# Patient Record
Sex: Male | Born: 1945 | Race: White | Hispanic: No | Marital: Married | Smoking: Never smoker
Health system: Southern US, Community
[De-identification: ages and names within clinical notes are randomized; demographics above are authoritative.]

## PROBLEM LIST (undated history)

## (undated) DIAGNOSIS — E079 Disorder of thyroid, unspecified: Secondary | ICD-10-CM

## (undated) DIAGNOSIS — F32A Depression, unspecified: Secondary | ICD-10-CM

## (undated) DIAGNOSIS — K30 Functional dyspepsia: Secondary | ICD-10-CM

## (undated) DIAGNOSIS — F329 Major depressive disorder, single episode, unspecified: Secondary | ICD-10-CM

## (undated) DIAGNOSIS — N183 Chronic kidney disease, stage 3 unspecified: Secondary | ICD-10-CM

## (undated) DIAGNOSIS — I10 Essential (primary) hypertension: Secondary | ICD-10-CM

## (undated) DIAGNOSIS — I255 Ischemic cardiomyopathy: Secondary | ICD-10-CM

## (undated) DIAGNOSIS — N400 Enlarged prostate without lower urinary tract symptoms: Secondary | ICD-10-CM

## (undated) DIAGNOSIS — I2510 Atherosclerotic heart disease of native coronary artery without angina pectoris: Secondary | ICD-10-CM

## (undated) DIAGNOSIS — I5042 Chronic combined systolic (congestive) and diastolic (congestive) heart failure: Secondary | ICD-10-CM

## (undated) DIAGNOSIS — F0390 Unspecified dementia without behavioral disturbance: Secondary | ICD-10-CM

## (undated) DIAGNOSIS — E785 Hyperlipidemia, unspecified: Secondary | ICD-10-CM

## (undated) DIAGNOSIS — R972 Elevated prostate specific antigen [PSA]: Secondary | ICD-10-CM

## (undated) HISTORY — PX: CARDIAC CATHETERIZATION: SHX172

## (undated) HISTORY — DX: Chronic kidney disease, stage 3 (moderate): N18.3

## (undated) HISTORY — DX: Depression, unspecified: F32.A

## (undated) HISTORY — DX: Essential (primary) hypertension: I10

## (undated) HISTORY — DX: Chronic combined systolic (congestive) and diastolic (congestive) heart failure: I50.42

## (undated) HISTORY — DX: Chronic kidney disease, stage 3 unspecified: N18.30

## (undated) HISTORY — DX: Ischemic cardiomyopathy: I25.5

## (undated) HISTORY — DX: Hyperlipidemia, unspecified: E78.5

## (undated) HISTORY — DX: Atherosclerotic heart disease of native coronary artery without angina pectoris: I25.10

---
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
## 2012-08-26 DIAGNOSIS — R972 Elevated prostate specific antigen [PSA]: Secondary | ICD-10-CM | POA: Insufficient documentation

## 2012-08-26 DIAGNOSIS — L219 Seborrheic dermatitis, unspecified: Secondary | ICD-10-CM | POA: Insufficient documentation

## 2014-12-21 ENCOUNTER — Encounter
Admission: RE | Admit: 2014-12-21 | Discharge: 2014-12-21 | Disposition: A | Discharge status: Home / Self Care | Payer: Medicare Other | Source: Ambulatory Visit | Attending: Urology | Admitting: Urology

## 2014-12-21 DIAGNOSIS — Z01810 Encounter for preprocedural cardiovascular examination: Secondary | ICD-10-CM | POA: Diagnosis not present

## 2014-12-21 DIAGNOSIS — I10 Essential (primary) hypertension: Secondary | ICD-10-CM | POA: Diagnosis not present

## 2014-12-21 HISTORY — DX: Functional dyspepsia: K30

## 2014-12-21 HISTORY — DX: Elevated prostate specific antigen (PSA): R97.20

## 2014-12-21 NOTE — Patient Instructions (Signed)
  Your procedure is scheduled on: 01/02/15 Tues  Report to Day Surgery. To find out your arrival time please call [phone] between 1PM - 3PM on 01/01/15 Mon.  Remember: Instructions that are not followed completely may result in serious medical risk, up to and including death, or upon the discretion of your surgeon and anesthesiologist your surgery may need to be rescheduled.    _x___ 1. Do not eat food or drink liquids after midnight. No gum chewing or hard candies.     ____ 2. No Alcohol for 24 hours before or after surgery.   ____ 3. Bring all medications with you on the day of surgery if instructed.    __x__ 4. Notify your doctor if there is any change in your medical condition     (cold, fever, infections).     Do not wear jewelry, make-up, hairpins, clips or nail polish.  Do not wear lotions, powders, or perfumes. You may wear deodorant.  Do not shave 48 hours prior to surgery. Men may shave face and neck.  Do not bring valuables to the hospital.    [facility] is not responsible for any belongings or valuables.               Contacts, dentures or bridgework may not be worn into surgery.  Leave your suitcase in the car. After surgery it may be brought to your room.  For patients admitted to the hospital, discharge time is determined by your                treatment team.   Patients discharged the day of surgery will not be allowed to drive home.   Please read over the following fact sheets that you were given:      __x__ Take these medicines the morning of surgery with A SIP OF WATER:    1. amLODipine (NORVASC) 5 MG tablet  2. metoprolol succinate (TOPROL-XL) 100 MG 24 hr tablet  3.   4.  5.  6.  ____ Fleet Enema (as directed)   _x___ Use CHG Soap as directed  ____ Use inhalers on the day of surgery  ____ Stop metformin 2 days prior to surgery    ____ Take 1/2 of usual insulin dose the night before surgery and none on the morning of surgery.   ____ Stop  Coumadin/Plavix/aspirin on   ____ Stop Anti-inflammatories on    ____ Stop supplements until after surgery.    ____ Bring C-Pap to the hospital.

## 2014-12-22 NOTE — H&P (Signed)
NAMESTEFFON, TAYMAN NO.:  [id number]  MEDICAL RECORD NO.:  [id number]  LOCATION:  PAT                          FACILITY:  [facility]  PHYSICIAN:  Anola Gurney          DATE OF BIRTH:  09-07-1945  DATE OF ADMISSION:  12/21/2014 DATE OF DISCHARGE:  12/21/2014                            HISTORY AND PHYSICAL   Same Day Surgery, January 02, 2015.  CHIEF COMPLAINT:  Difficulty voiding.  HISTORY OF PRESENT ILLNESS:  [name] is a 69 year old Caucasian male with a long history of difficulty voiding.  He complains of incomplete emptying, intermittent weak stream and nocturia.  He also was found to have an elevated PSA at 4.09 ng/mL on Oct 25, 2014 and 4.20 ng/mL on September 21, 2014.  He underwent ultrasound-guided needle biopsy on November 23, 2014.  Ultrasound revealed a 55.1 g prostate.  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  The patient comes in now for photovaporization of the prostate with a GreenLight laser.  PAST MEDICAL HISTORY:  The patient was allergic to sulfa drugs.  CURRENT MEDICATIONS:  Included amlodipine, clotrimazole, doxycycline, metoprolol, tamsulosin and finasteride.  PREVIOUS SURGICAL PROCEDURE:  No previous surgical procedures.  SOCIAL HISTORY:  The patient denied tobacco or alcohol use.  FAMILY HISTORY:  Mother died at age 62 of lung cancer.  Father died at age 23 of pulmonary embolus secondary to hip fracture.  PAST AND CURRENT MEDICAL CONDITIONS: 1. Hypertension since 2004. 2. Arthritis.  REVIEW OF SYSTEMS:  The patient denies chest pain, shortness of breath, diabetes, stroke or heart disease.  PHYSICAL EXAMINATION:  GENERAL:  Generally well-nourished white male in no acute distress. HEENT:  Sclerae were clear.  Pupils were equally round and reactive to light and accommodation.  Extraocular movements were intact. NECK:  Supple.  No palpable masses or tenderness.  Thyroid gland was smooth without palpable  nodules.  No audible carotid bruits. LUNGS:  Clear to auscultation. CARDIOVASCULAR:  Regular rhythm and rate without audible murmurs. ABDOMEN:  Soft abdomen.  No CVA tenderness.  No palpable abdominal masses. GU:  The patient was uncircumcised with phimosis.  Testes were smooth and nontender, 18 cm size, each. RECTAL:  Internal and external hemorrhoids.  Prostate gland greater than 50 g, smooth and nontender. NEUROMUSCULAR:  Alert and oriented x3.  IMPRESSION: 1. Significant benign prostatic hypertrophy with bladder outlet     obstruction. 2. Elevated PSA. 3. High-grade prostatic intraepithelial neoplasia.  PLAN:  Photovaporization of prostate with a GreenLight laser.          ______________________________ Anola Gurney     MW/MEDQ  D:  12/21/2014  T:  12/22/2014  Job:  [id number]

## 2015-01-02 ENCOUNTER — Ambulatory Visit
Admission: RE | Admit: 2015-01-02 | Discharge: 2015-01-02 | Disposition: A | Discharge status: Home / Self Care | Payer: Medicare Other | Source: Ambulatory Visit | Attending: Urology | Admitting: Urology

## 2015-01-02 ENCOUNTER — Ambulatory Visit: Payer: Medicare Other | Admitting: Anesthesiology

## 2015-01-02 ENCOUNTER — Encounter
Admission: RE | Disposition: A | Discharge status: Home / Self Care | Payer: Self-pay | Source: Ambulatory Visit | Attending: Urology

## 2015-01-02 ENCOUNTER — Encounter: Payer: Self-pay | Admitting: *Deleted

## 2015-01-02 DIAGNOSIS — M1990 Unspecified osteoarthritis, unspecified site: Secondary | ICD-10-CM | POA: Diagnosis not present

## 2015-01-02 DIAGNOSIS — K30 Functional dyspepsia: Secondary | ICD-10-CM | POA: Insufficient documentation

## 2015-01-02 DIAGNOSIS — R300 Dysuria: Secondary | ICD-10-CM | POA: Insufficient documentation

## 2015-01-02 DIAGNOSIS — K219 Gastro-esophageal reflux disease without esophagitis: Secondary | ICD-10-CM | POA: Diagnosis not present

## 2015-01-02 DIAGNOSIS — K648 Other hemorrhoids: Secondary | ICD-10-CM | POA: Diagnosis not present

## 2015-01-02 DIAGNOSIS — Z882 Allergy status to sulfonamides status: Secondary | ICD-10-CM | POA: Insufficient documentation

## 2015-01-02 DIAGNOSIS — K644 Residual hemorrhoidal skin tags: Secondary | ICD-10-CM | POA: Diagnosis not present

## 2015-01-02 DIAGNOSIS — R3912 Poor urinary stream: Secondary | ICD-10-CM | POA: Insufficient documentation

## 2015-01-02 DIAGNOSIS — R351 Nocturia: Secondary | ICD-10-CM | POA: Diagnosis not present

## 2015-01-02 DIAGNOSIS — N138 Other obstructive and reflux uropathy: Secondary | ICD-10-CM | POA: Diagnosis not present

## 2015-01-02 DIAGNOSIS — Z79899 Other long term (current) drug therapy: Secondary | ICD-10-CM | POA: Diagnosis not present

## 2015-01-02 DIAGNOSIS — I10 Essential (primary) hypertension: Secondary | ICD-10-CM | POA: Insufficient documentation

## 2015-01-02 DIAGNOSIS — N401 Enlarged prostate with lower urinary tract symptoms: Secondary | ICD-10-CM | POA: Diagnosis not present

## 2015-01-02 HISTORY — PX: GREEN LIGHT LASER TURP (TRANSURETHRAL RESECTION OF PROSTATE: SHX6260

## 2015-01-02 SURGERY — GREEN LIGHT LASER TURP (TRANSURETHRAL RESECTION OF PROSTATE
Anesthesia: General | Site: Prostate | Wound class: Clean Contaminated

## 2015-01-02 MED ORDER — FENTANYL CITRATE (PF) 100 MCG/2ML IJ SOLN
INTRAMUSCULAR | Status: DC | PRN
  Administered 2015-01-02 (×3): 50 ug via INTRAVENOUS
  Administered 2015-01-02: 100 ug via INTRAVENOUS

## 2015-01-02 MED ORDER — PROPOFOL 10 MG/ML IV BOLUS
INTRAVENOUS | Status: DC | PRN
  Administered 2015-01-02: 140 mg via INTRAVENOUS

## 2015-01-02 MED ORDER — LIDOCAINE HCL 2 % EX GEL
CUTANEOUS | Status: DC | PRN
  Administered 2015-01-02: 1 via URETHRAL

## 2015-01-02 MED ORDER — FAMOTIDINE 20 MG PO TABS
20.0000 mg | ORAL_TABLET | Freq: Once | ORAL | Status: AC
  Administered 2015-01-02: 20 mg via ORAL

## 2015-01-02 MED ORDER — ACETAMINOPHEN 10 MG/ML IV SOLN
INTRAVENOUS | Status: DC | PRN
  Administered 2015-01-02: 1000 mg via INTRAVENOUS

## 2015-01-02 MED ORDER — ONDANSETRON HCL 4 MG/2ML IJ SOLN
INTRAMUSCULAR | Status: DC | PRN
  Administered 2015-01-02 (×2): 4 mg via INTRAVENOUS

## 2015-01-02 MED ORDER — LEVOFLOXACIN IN D5W 500 MG/100ML IV SOLN
500.0000 mg | INTRAVENOUS | Status: DC
  Administered 2015-01-02: 500 mg via INTRAVENOUS

## 2015-01-02 MED ORDER — LEVOFLOXACIN 500 MG PO TABS: 500.0000 mg | ORAL_TABLET | Freq: Every day | ORAL | Status: DC

## 2015-01-02 MED ORDER — OXYCODONE HCL 5 MG PO TABS: 5.0000 mg | ORAL_TABLET | Freq: Once | ORAL | Status: DC | PRN

## 2015-01-02 MED ORDER — MIDAZOLAM HCL 2 MG/2ML IJ SOLN
INTRAMUSCULAR | Status: DC | PRN
  Administered 2015-01-02: 1 mg via INTRAVENOUS

## 2015-01-02 MED ORDER — LEVOFLOXACIN IN D5W 500 MG/100ML IV SOLN
INTRAVENOUS | Status: AC
  Administered 2015-01-02: 500 mg via INTRAVENOUS
  Filled 2015-01-02: qty 100

## 2015-01-02 MED ORDER — FENTANYL CITRATE (PF) 100 MCG/2ML IJ SOLN: 25.0000 ug | INTRAMUSCULAR | Status: DC | PRN

## 2015-01-02 MED ORDER — ACETAMINOPHEN 10 MG/ML IV SOLN
INTRAVENOUS | Status: AC
  Filled 2015-01-02: qty 100

## 2015-01-02 MED ORDER — HYDROMORPHONE HCL 1 MG/ML IJ SOLN
INTRAMUSCULAR | Status: DC | PRN
  Administered 2015-01-02: 1 mg via INTRAVENOUS

## 2015-01-02 MED ORDER — URIBEL 118 MG PO CAPS: 1.0000 | ORAL_CAPSULE | Freq: Four times a day (QID) | ORAL | Status: DC | PRN

## 2015-01-02 MED ORDER — DOCUSATE SODIUM 100 MG PO CAPS: 200.0000 mg | ORAL_CAPSULE | Freq: Two times a day (BID) | ORAL | Status: DC

## 2015-01-02 MED ORDER — OXYCODONE HCL 5 MG/5ML PO SOLN: 5.0000 mg | Freq: Once | ORAL | Status: DC | PRN

## 2015-01-02 MED ORDER — LACTATED RINGERS IV SOLN
INTRAVENOUS | Status: DC
  Administered 2015-01-02 (×2): via INTRAVENOUS

## 2015-01-02 MED ORDER — NUCYNTA 50 MG PO TABS: 50.0000 mg | ORAL_TABLET | Freq: Four times a day (QID) | ORAL | Status: DC | PRN

## 2015-01-02 MED ORDER — LIDOCAINE HCL 2 % EX GEL
CUTANEOUS | Status: AC
  Filled 2015-01-02: qty 10

## 2015-01-02 MED ORDER — LIDOCAINE HCL (CARDIAC) 20 MG/ML IV SOLN
INTRAVENOUS | Status: DC | PRN
  Administered 2015-01-02: 100 mg via INTRAVENOUS

## 2015-01-02 MED ORDER — KETAMINE HCL 50 MG/ML IJ SOLN
INTRAMUSCULAR | Status: DC | PRN
  Administered 2015-01-02: 50 mg via INTRAMUSCULAR

## 2015-01-02 MED ORDER — ONDANSETRON 8 MG PO TBDP: 8.0000 mg | ORAL_TABLET | Freq: Four times a day (QID) | ORAL | Status: DC | PRN

## 2015-01-02 MED ORDER — BELLADONNA ALKALOIDS-OPIUM 16.2-60 MG RE SUPP
RECTAL | Status: DC | PRN
  Administered 2015-01-02: 1 via RECTAL

## 2015-01-02 MED ORDER — FAMOTIDINE 20 MG PO TABS
ORAL_TABLET | ORAL | Status: AC
  Administered 2015-01-02: 20 mg via ORAL
  Filled 2015-01-02: qty 1

## 2015-01-02 MED ORDER — GLYCOPYRROLATE 0.2 MG/ML IJ SOLN
INTRAMUSCULAR | Status: DC | PRN
  Administered 2015-01-02: 0.2 mg via INTRAVENOUS

## 2015-01-02 MED ORDER — BELLADONNA ALKALOIDS-OPIUM 16.2-60 MG RE SUPP
RECTAL | Status: AC
  Filled 2015-01-02: qty 1

## 2015-01-02 MED ORDER — ONDANSETRON HCL 4 MG/2ML IJ SOLN
INTRAMUSCULAR | Status: AC
  Filled 2015-01-02: qty 2

## 2015-01-02 SURGICAL SUPPLY — 27 items
ADAPTER IRRIG TUBE 2 SPIKE SOL (ADAPTER) ×6 IMPLANT
BAG URO DRAIN 2000ML W/SPOUT (MISCELLANEOUS) ×3 IMPLANT
CATH FOLEY 2WAY  5CC 20FR SIL (CATHETERS) ×2
CATH FOLEY 2WAY 5CC 20FR SIL (CATHETERS) ×1 IMPLANT
GLOVE BIO SURGEON STRL SZ7 (GLOVE) ×6 IMPLANT
GLOVE BIO SURGEON STRL SZ7.5 (GLOVE) ×9 IMPLANT
GOWN STRL REUS W/ TWL LRG LVL3 (GOWN DISPOSABLE) ×1 IMPLANT
GOWN STRL REUS W/ TWL XL LVL3 (GOWN DISPOSABLE) ×1 IMPLANT
GOWN STRL REUS W/TWL LRG LVL3 (GOWN DISPOSABLE) ×2
GOWN STRL REUS W/TWL XL LVL3 (GOWN DISPOSABLE) ×2
IV NS 1000ML (IV SOLUTION) ×2
IV NS 1000ML BAXH (IV SOLUTION) ×1 IMPLANT
IV SET PRIMARY 15D 139IN B9900 (IV SETS) ×3 IMPLANT
JELLY LUB 2OZ STRL (MISCELLANEOUS) ×2
JELLY LUBE 2OZ STRL (MISCELLANEOUS) ×1 IMPLANT
KIT RM TURNOVER CYSTO AR (KITS) ×3 IMPLANT
LASER GRNLGT 950 (MISCELLANEOUS) ×3 IMPLANT
LASER GRNLGT MOXY FIBER 750UM (MISCELLANEOUS) ×3 IMPLANT
PACK CYSTO AR (MISCELLANEOUS) ×3 IMPLANT
PREP PVP WINGED SPONGE (MISCELLANEOUS) ×3 IMPLANT
SET IRRIG Y TYPE TUR BLADDER L (SET/KITS/TRAYS/PACK) ×3 IMPLANT
SOL PREP PVP 2OZ (MISCELLANEOUS) ×3
SOLUTION PREP PVP 2OZ (MISCELLANEOUS) ×1 IMPLANT
SYRINGE IRR TOOMEY STRL 70CC (SYRINGE) ×3 IMPLANT
TUBING CONNECTING 10 (TUBING) ×2 IMPLANT
TUBING CONNECTING 10' (TUBING) ×1
WATER STERILE IRR 1000ML POUR (IV SOLUTION) ×3 IMPLANT

## 2015-01-02 NOTE — Anesthesia Procedure Notes (Signed)
Procedure Name: LMA Insertion Date/Time: 01/02/2015 1:56 PM Performed by: Shirlee Limerick, Sky Primo Pre-anesthesia Checklist: Patient identified, Emergency Drugs available, Suction available and Patient being monitored Patient Re-evaluated:Patient Re-evaluated prior to inductionOxygen Delivery Method: Circle system utilized Preoxygenation: Pre-oxygenation with 100% oxygen Intubation Type: IV induction LMA: LMA inserted LMA Size: 5.0 Number of attempts: 1

## 2015-01-02 NOTE — Anesthesia Postprocedure Evaluation (Signed)
  Anesthesia Post-op Note  Patient: Jesus Bolton  Procedure(s) Performed: Procedure(s): GREEN LIGHT LASER TURP (TRANSURETHRAL RESECTION OF PROSTATE (N/A)  Anesthesia type:General  Patient location: PACU  Post pain: Pain level controlled  Post assessment: Post-op Vital signs reviewed, Patient's Cardiovascular Status Stable, Respiratory Function Stable, Patent Airway and No signs of Nausea or vomiting  Post vital signs: Reviewed and stable  Last Vitals:  Filed Vitals:   01/02/15 1642  BP: 135/62  Pulse:   Temp: 36.1 C  Resp: 18    Level of consciousness: awake, alert  and patient cooperative  Complications: No apparent anesthesia complications

## 2015-01-02 NOTE — Anesthesia Preprocedure Evaluation (Addendum)
Anesthesia Evaluation  Patient identified by MRN, date of birth, ID band Patient awake    Reviewed: Allergy & Precautions, H&P , NPO status , Patient's Chart, lab work & pertinent test results, reviewed documented beta blocker date and time   Airway Mallampati: III  TM Distance: >3 FB Neck ROM: limited    Dental  (+) Chipped   Pulmonary neg pulmonary ROS,  breath sounds clear to auscultation  Pulmonary exam normal       Cardiovascular Exercise Tolerance: Good hypertension, Normal cardiovascular examRhythm:regular Rate:Normal     Neuro/Psych negative neurological ROS  negative psych ROS   GI/Hepatic Neg liver ROS, GERD-  Controlled,  Endo/Other  negative endocrine ROS  Renal/GU negative Renal ROS  negative genitourinary   Musculoskeletal   Abdominal   Peds  Hematology negative hematology ROS (+)   Anesthesia Other Findings Past Medical History:   Hypertension                                                 Elevated PSA                                                 Indigestion                                                  Reproductive/Obstetrics negative OB ROS                            Anesthesia Physical Anesthesia Plan  ASA: III  Anesthesia Plan: General   Post-op Pain Management:    Induction:   Airway Management Planned:   Additional Equipment:   Intra-op Plan:   Post-operative Plan:   Informed Consent: I have reviewed the patients History and Physical, chart, labs and discussed the procedure including the risks, benefits and alternatives for the proposed anesthesia with the patient or authorized representative who has indicated his/her understanding and acceptance.   Dental Advisory Given  Plan Discussed with: Anesthesiologist, CRNA and Surgeon  Anesthesia Plan Comments:         Anesthesia Quick Evaluation

## 2015-01-02 NOTE — Transfer of Care (Signed)
Immediate Anesthesia Transfer of Care Note  Patient: Jesus Bolton  Procedure(s) Performed: Procedure(s): GREEN LIGHT LASER TURP (TRANSURETHRAL RESECTION OF PROSTATE (N/A)  Patient Location: PACU  Anesthesia Type:General  Level of Consciousness: sedated  Airway & Oxygen Therapy: Patient Spontanous Breathing and Patient connected to face mask oxygen  Post-op Assessment: Report given to RN and Post -op Vital signs reviewed and stable  Post vital signs: Reviewed and stable  Last Vitals:  Filed Vitals:   01/02/15 1522  BP: 118/72  Pulse: 73  Temp: 36.8 C  Resp: 14    Complications: No apparent anesthesia complications

## 2015-01-02 NOTE — Op Note (Signed)
Preoperative diagnosis: BPH with bladder outlet obstruction Postoperative diagnosis: BPH with bladder outlet obstruction  Procedure: Photovaporization of the prostate with greenlight laser   Surgeon: Suszanne Conners. Evelene Croon MD, FACS Anesthesia: Gen.  Indications:See the history and physical. After informed consent the above procedure(s) were requested     Technique and findings: After adequate general anesthesia been obtained the patient was placed into dorsal lithotomy position and the perineum was prepped and draped in the usual fashion. The laser scope was then coupled to the camera and then visually advanced into the bladder. The bladder was moderately trabeculated. No bladder tumors were identified. Both ureteral orifices were identified and had clear reflux. Patient was noted have trilobar BPH with visual obstruction. At this point the greenlight XPS laser fiber was introduced through the scope and vaporization was begun at the bladder neck at 80 W of power. Power was then increased to 120 W and remaining obstructive tissue from the bladder neck to the verumontanum was vaporized. The power was then increased up to 180 W and remaining obstructive tissue vaporized. No bleeding was noted. The laser scope was removed. 10 cc of viscous Xylocaine was instilled within the urethra and the bladder. A 20 French silicone catheter was inserted. Catheter was irrigated until clear. A B&O suppository was placed. The procedure was then terminated and the patient was transferred to the recovery room in stable condition. Blood loss was minimal.

## 2015-01-02 NOTE — H&P (Signed)
Date of Initial H&P: 12/21/14  History reviewed, patient examined, no change in status, stable for surgery.

## 2015-01-02 NOTE — Discharge Instructions (Signed)
Benign Prostatic Hyperplasia An enlarged prostate (benign prostatic hyperplasia) is common in older men. You may experience the following:  Weak urine stream.  Dribbling.  Feeling like the bladder has not emptied completely.  Difficulty starting urination.  Getting up frequently at night to urinate.  Urinating more frequently during the day. HOME CARE INSTRUCTIONS  Monitor your prostatic hyperplasia for any changes. The following actions may help to alleviate any discomfort you are experiencing:  Give yourself time when you urinate.  Stay away from alcohol.  Avoid beverages containing caffeine, such as coffee, tea, and colas, because they can make the problem worse.  Avoid decongestants, antihistamines, and some prescription medicines that can make the problem worse.  Follow up with your health care provider for further treatment as recommended. SEEK MEDICAL CARE IF:  You are experiencing progressive difficulty voiding.  Your urine stream is progressively getting narrower.  You are awaking from sleep with the urge to void more frequently.  You are constantly feeling the need to void.  You experience loss of urine, especially in small amounts. SEEK IMMEDIATE MEDICAL CARE IF:   You develop increased pain with urination or are unable to urinate.  You develop severe abdominal pain, vomiting, a high fever, or fainting.  You develop back pain or blood in your urine. MAKE SURE YOU:   Understand these instructions.  Will watch your condition.  Will get help right away if you are not doing well or get worse. Document Released: 06/02/2005 Document Revised: 02/02/2013 Document Reviewed: 11/02/2012 [facility] Patient Information 2015 [place], [place]. This information is not intended to replace advice given to you by your health care provider. Make sure you discuss any questions you have with your health care provider.  Benign Prostatic Hypertrophy The prostate gland is part  of the reproductive system of men. A normal prostate is about the size and shape of a walnut. The prostate gland produces a fluid that is mixed with sperm to make semen. This gland surrounds the urethra and is located in front of the rectum and just below the bladder. The bladder is where urine is stored. The urethra is the tube through which urine passes from the bladder to get out of the body. The prostate grows as a man ages. An enlarged prostate not caused by cancer is called benign prostatic hypertrophy (BPH). An enlarged prostate can press on the urethra. This can make it harder to pass urine. In the early stages of enlargement, the bladder can get by with a narrowed urethra by forcing the urine through. If the problem gets worse, medical or surgical treatment may be required.  This condition should be followed by your health care provider. The accumulation of urine in the bladder can cause infection. Back pressure and infection can progress to bladder damage and kidney (renal) failure. If needed, your health care provider may refer you to a specialist in kidney and prostate disease (urologist). CAUSES  BPH is a common health problem in men older than 50 years. This condition is a normal part of aging. However, not all men will develop problems from this condition. If the enlargement grows away from the urethra, then there will not be any compression of the urethra and resistance to urine flow.If the growth is toward the urethra and compresses it, you will experience difficulty urinating.  SYMPTOMS   Not able to completely empty your bladder.  Getting up often during the night to urinate.  Need to urinate frequently during the day.  Difficultly  starting urine flow.  Decrease in size and strength of your urine stream.  Dribbling after urination.  Pain on urination (more common with infection).  Inability to pass urine. This needs immediate treatment.  The development of a urinary tract  infection. DIAGNOSIS  These tests will help your health care provider understand your problem:  A thorough history and physical examination.  A urination history, with the number of times you urinate, the amounts of urine, the strength of the urine stream, and the feeling of emptiness or fullness after urinating.  A postvoid bladder scan that measures any amount of urine that may remain in your bladder after you finish urinating.  Digital rectal exam. In a rectal exam, your health care provider checks your prostate by putting a gloved, lubricated finger into your rectum to feel the back of your prostate gland. This exam detects the size of your gland and abnormal lumps or growths.  Exam of your urine (urinalysis).  Prostate specific antigen (PSA) screening. This is a blood test used to screen for prostate cancer.  Rectal ultrasonography. This test uses sound waves to electronically produce a picture of your prostate gland. TREATMENT  Once symptoms begin, your health care provider will monitor your condition. Of the men with this condition, one third will have symptoms that stabilize, one third will have symptoms that improve, and one third will have symptoms that progress in the first year. Mild symptoms may not need treatment. Simple observation and yearly exams may be all that is required. Medicines and surgery are options for more severe problems. Your health care provider can help you make an informed decision for what is best. Two classes of medicines are available for relief of prostate symptoms:  Medicines that shrink the prostate. This helps relieve symptoms. These medicines take time to work, and it may be months before any improvement is seen.  Uncommon side effects include problems with sexual function.  Medicines to relax the muscle of the prostate. This also relieves the obstruction by reducing any compression on the urethra.This group of medicines work much faster than those  that reduce the size of the prostate gland. Usually, one can experience improvement in days to weeks..  Side effects can include dizziness, fatigue, lightheadedness, and retrograde ejaculation (diminished volume of ejaculate). Several types of surgical treatments are available for relief of prostate symptoms:  Transurethral resection of the prostate (TURP)--In this treatment, an instrument is inserted through opening at the tip of the penis. It is used to cut away pieces of the inner core of the prostate. The pieces are removed through the same opening of the penis. This removes the obstruction and helps get rid of the symptoms.  Transurethral incision (TUIP)--In this procedure, small cuts are made in the prostate. This lessens the prostates pressure on the urethra.  Transurethral microwave thermotherapy (TUMT)--This procedure uses microwaves to create heat. The heat destroys and removes a small amount of prostate tissue.  Transurethral needle ablation (TUNA)--This is a procedure that uses radio frequencies to do the same as TUMT.  Interstitial laser coagulation (ILC)--This is a procedure that uses a laser to do the same as TUMT and TUNA.  Transurethral electrovaporization (TUVP)--This is a procedure that uses electrodes to do the same as the procedures listed above. SEEK MEDICAL CARE IF:   You develop a fever.  There is unexplained back pain.  Symptoms are not helped by medicines prescribed.  You develop side effects from the medicine you are taking.  Your urine  becomes very dark or has a bad smell.  Your lower abdomen becomes distended and you have difficulty passing your urine. SEEK IMMEDIATE MEDICAL CARE IF:   You are suddenly unable to urinate. This is an emergency. You should be seen immediately.  There are large amounts of blood or clots in the urine.  Your urinary problems become unmanageable.  You develop lightheadedness, severe dizziness, or you feel faint.  You  develop moderate to severe low back or flank pain.  You develop chills or fever. Document Released: 06/02/2005 Document Revised: 06/07/2013 Document Reviewed: 12/16/2012 [facility] Patient Information 2015 [place], [place]. This information is not intended to replace advice given to you by your health care provider. Make sure you discuss any questions you have with your health care provider.  Prostate Laser Surgery Prostate laser surgery is a procedure to eliminate prostate tissue. There are two types of prostate laser surgery: ablation (prostate tissue is melted away) and enucleation (prostate tissue is cut out). LET [facility] CARE PROVIDER KNOW ABOUT:  Any allergies you have.  All medicines you are taking, including vitamins, herbs, eye drops, creams, and over-the-counter medicines.  Previous problems you or members of your family have had with the use of anesthetics.  Any blood disorders you have.  Previous surgeries you have had.  Medical conditions you have. RISKS AND COMPLICATIONS  Generally prostate laser surgery is a safe procedure. However, as with any procedure, problems can occur. Possible problems include:  Bleeding and the need for a blood transfusion.   Urinary tract infection.  Erectile dysfunction.  Narrowing (scar or stricture) of the urethra, which blocks the flow of urine.  Dry ejaculation (semen is not released when you reach sexual climax). BEFORE THE PROCEDURE   If you are on blood thinners, such as warfarin or clopidogrel, or nonprescription pain-relieving medicines, such as naproxen sodium or ibuprofen, you may be asked to stop taking them before the procedure.  Your health care provider may ask you to start taking antibiotic medicines before the procedure as a precaution against a bacterial infection. The procedure will not be performed if your urine is infected.  You should have nothing to eat or drink for at least 8 hours before your procedure, or as  suggested by your health care provider. You may have a sip of water to take medications not stopped for the procedure. PROCEDURE  You will be given one of the following:   A medicine that numbs the area (local anesthetic).  A medicine injected into your spine that numbs your body below the waist (spinal anesthetic). A sedative is usually given with spinal anesthetic so you will be relaxed during the procedure. A viewing scope and instruments will be placed in a tube that is inserted through your penis, so no incisions will be needed to insert the scope and instruments. Depending on the type of laser used, the prostate tissue will either be vaporized or cut away. The laser beam will coagulate any small bleeding areas. At the end of the surgery, a special tube will be inserted into your bladder to drain the urine from your bladder (urinary catheter). AFTER THE PROCEDURE You will be sent to the recovery room for a short time. In the recovery room, you will receive fluids through an IV tube inserted in one of your veins. Your blood pressure and pulse will be checked frequently to make sure that they stabilize. Once you are eating and drinking fluids appropriately, the IV tube will be removed.  Depending on your specific needs, you may be admitted to the hospital or you will be sent home after the procedure. If you are sent home:  You may be sent home with elastic support stockings to help prevent blood clots in your legs.  You will also probably be given an antibiotic medicine.  Unless told otherwise, you may restart your other medications.  You may be given a stool softener. Document Released: 06/02/2005 Document Revised: 06/07/2013 Document Reviewed: 11/22/2012 [facility] Patient Information 2015 [place], [place]. This information is not intended to replace advice given to you by your health care provider. Make sure you discuss any questions you have with your health care provider.

## 2015-07-29 ENCOUNTER — Ambulatory Visit
Admission: EM | Admit: 2015-07-29 | Discharge: 2015-07-29 | Disposition: A | Discharge status: Home / Self Care | Payer: Medicare Other | Attending: Family Medicine | Admitting: Family Medicine

## 2015-07-29 DIAGNOSIS — B9789 Other viral agents as the cause of diseases classified elsewhere: Principal | ICD-10-CM

## 2015-07-29 DIAGNOSIS — J069 Acute upper respiratory infection, unspecified: Secondary | ICD-10-CM | POA: Diagnosis not present

## 2015-07-29 HISTORY — DX: Benign prostatic hyperplasia without lower urinary tract symptoms: N40.0

## 2015-07-29 MED ORDER — GUAIFENESIN-CODEINE 100-10 MG/5ML PO SOLN: ORAL | Status: DC

## 2015-07-29 NOTE — ED Notes (Signed)
Patient c/o cough, nasal congestion, fever(102 degrees Tue & Wed), and no sore throat currently.  His symptoms started 1 week ago but denies c/n/v/chest pain.

## 2015-07-29 NOTE — ED Provider Notes (Signed)
CSN: [id number]     Arrival date & time 07/29/15  1355 History   First MD Initiated Contact with Patient 07/29/15 1428     Chief Complaint  Patient presents with  . Cough   (Consider location/radiation/quality/duration/timing/severity/associated sxs/prior Treatment) Patient is a 70 y.o. male presenting with URI.  URI Presenting symptoms: congestion, cough and fever   Presenting symptoms: no sore throat   Severity:  Moderate Onset quality:  Sudden Duration:  1 week Timing:  Constant Progression:  Worsening Chronicity:  New Relieved by:  Nothing Ineffective treatments:  OTC medications Associated symptoms: no headaches, no myalgias, no sinus pain and no wheezing   Risk factors: sick contacts   Risk factors: no diabetes mellitus, no immunosuppression, no recent illness and no recent travel     Past Medical History  Diagnosis Date  . Hypertension   . Elevated PSA   . Indigestion   . BPH (benign prostatic hyperplasia)    Past Surgical History  Procedure Laterality Date  . Green light laser turp (transurethral resection of prostate N/A 01/02/2015    Procedure: GREEN LIGHT LASER TURP (TRANSURETHRAL RESECTION OF PROSTATE;  Surgeon: Orson Ape, MD;  Location: [facility] ORS;  Service: Urology;  Laterality: N/A;   Family History  Problem Relation Age of Onset  . Cancer Mother    Social History  Substance Use Topics  . Smoking status: Never Smoker   . Smokeless tobacco: None  . Alcohol Use: No    Review of Systems  Constitutional: Positive for fever.  HENT: Positive for congestion. Negative for sore throat.   Respiratory: Positive for cough. Negative for wheezing.   Musculoskeletal: Negative for myalgias.  Neurological: Negative for headaches.    Allergies  Sulfa antibiotics  Home Medications   Prior to Admission medications   Medication Sig Start Date End Date Taking? Authorizing Provider  amLODipine (NORVASC) 5 MG tablet Take 5 mg by mouth daily.   Yes Historical  Provider, MD  metoprolol succinate (TOPROL-XL) 100 MG 24 hr tablet Take 50 mg by mouth daily. Take with or immediately following a meal.   Yes Historical Provider, MD  docusate sodium (COLACE) 100 MG capsule Take 2 capsules (200 mg total) by mouth 2 (two) times daily. 01/02/15   Orson Ape, MD  finasteride (PROSCAR) 5 MG tablet Take 5 mg by mouth daily.    Historical Provider, MD  guaiFENesin-codeine 100-10 MG/5ML syrup 10 ml po 8 hours prn cough 07/29/15   Payton Mccallum, MD  levofloxacin (LEVAQUIN) 500 MG tablet Take 1 tablet (500 mg total) by mouth daily. 01/02/15   Orson Ape, MD  Meth-Hyo-M Salley Hews Phos-Ph Sal (URIBEL) 118 MG CAPS Take 1 capsule (118 mg total) by mouth every 6 (six) hours as needed. 01/02/15   Orson Ape, MD  NUCYNTA 50 MG TABS tablet Take 1 tablet (50 mg total) by mouth every 6 (six) hours as needed. 1 TO 2 TABS Q 6 HOURS PRN PAIN 01/02/15   Orson Ape, MD  ondansetron (ZOFRAN ODT) 8 MG disintegrating tablet Take 1 tablet (8 mg total) by mouth every 6 (six) hours as needed for nausea or vomiting. 01/02/15   Orson Ape, MD  Probiotic Product (PROBIOTIC DAILY PO) Take by mouth.    Historical Provider, MD  tamsulosin (FLOMAX) 0.4 MG CAPS capsule Take 0.4 mg by mouth.    Historical Provider, MD   Meds Ordered and Administered this Visit  Medications - No data to display  BP 136/74 mmHg  Pulse 60  Temp(Src) 98.4 F (36.9 C) (Oral)  Resp 16  Ht 6' (1.829 m)  Wt 190 lb (86.183 kg)  BMI 25.76 kg/m2  SpO2 98% No data found.   Physical Exam  Constitutional: He appears well-developed and well-nourished. No distress.  HENT:  Head: Normocephalic and atraumatic.  Right Ear: Tympanic membrane, external ear and ear canal normal.  Left Ear: Tympanic membrane, external ear and ear canal normal.  Nose: Nose normal.  Mouth/Throat: Uvula is midline, oropharynx is clear and moist and mucous membranes are normal. No oropharyngeal exudate or tonsillar abscesses.   Eyes: Conjunctivae and EOM are normal. Pupils are equal, round, and reactive to light. Right eye exhibits no discharge. Left eye exhibits no discharge. No scleral icterus.  Neck: Normal range of motion. Neck supple. No tracheal deviation present. No thyromegaly present.  Cardiovascular: Normal rate, regular rhythm and normal heart sounds.   Pulmonary/Chest: Effort normal and breath sounds normal. No stridor. No respiratory distress. He has no wheezes. He has no rales. He exhibits no tenderness.  Lymphadenopathy:    He has no cervical adenopathy.  Neurological: He is alert.  Skin: Skin is warm and dry. No rash noted. He is not diaphoretic.  Nursing note and vitals reviewed.   ED Course  Procedures (including critical care time)  Labs Review Labs Reviewed - No data to display  Imaging Review No results found.   Visual Acuity Review  Right Eye Distance:   Left Eye Distance:   Bilateral Distance:    Right Eye Near:   Left Eye Near:    Bilateral Near:         MDM   1. Viral URI with cough    Discharge Medication List as of 07/29/2015  3:05 PM    START taking these medications   Details  guaiFENesin-codeine 100-10 MG/5ML syrup 10 ml po 8 hours prn cough, Print       1. Labs/x-ray results and diagnosis reviewed with patient/parent/guardian/family 2. rx as per orders above; reviewed possible side effects, interactions, risks and benefits  3. Recommend supportive treatment with rest, increased fluids, otc analgesics prn 4. Follow-up prn if symptoms worsen or don't improve    Payton Mccallum, MD 07/29/15 [phone]

## 2016-05-22 DIAGNOSIS — N183 Chronic kidney disease, stage 3 unspecified: Secondary | ICD-10-CM | POA: Insufficient documentation

## 2016-05-22 DIAGNOSIS — R768 Other specified abnormal immunological findings in serum: Secondary | ICD-10-CM | POA: Insufficient documentation

## 2017-10-05 ENCOUNTER — Other Ambulatory Visit: Payer: Self-pay | Admitting: Family Medicine

## 2017-10-05 DIAGNOSIS — R634 Abnormal weight loss: Secondary | ICD-10-CM

## 2017-10-05 DIAGNOSIS — R5383 Other fatigue: Secondary | ICD-10-CM

## 2017-10-08 ENCOUNTER — Ambulatory Visit
Admission: RE | Admit: 2017-10-08 | Discharge: 2017-10-08 | Disposition: A | Discharge status: Home / Self Care | Payer: Medicare Other | Source: Ambulatory Visit | Attending: Family Medicine | Admitting: Family Medicine

## 2017-10-08 DIAGNOSIS — K449 Diaphragmatic hernia without obstruction or gangrene: Secondary | ICD-10-CM | POA: Diagnosis not present

## 2017-10-08 DIAGNOSIS — I2510 Atherosclerotic heart disease of native coronary artery without angina pectoris: Secondary | ICD-10-CM | POA: Diagnosis not present

## 2017-10-08 DIAGNOSIS — M4854XA Collapsed vertebra, not elsewhere classified, thoracic region, initial encounter for fracture: Secondary | ICD-10-CM | POA: Diagnosis not present

## 2017-10-08 DIAGNOSIS — I700 Atherosclerosis of aorta: Secondary | ICD-10-CM | POA: Diagnosis not present

## 2017-10-08 DIAGNOSIS — R5383 Other fatigue: Secondary | ICD-10-CM | POA: Insufficient documentation

## 2017-10-08 DIAGNOSIS — R634 Abnormal weight loss: Secondary | ICD-10-CM | POA: Insufficient documentation

## 2017-10-20 ENCOUNTER — Other Ambulatory Visit: Payer: Self-pay | Admitting: Family Medicine

## 2017-10-20 DIAGNOSIS — R4182 Altered mental status, unspecified: Secondary | ICD-10-CM

## 2017-10-23 ENCOUNTER — Ambulatory Visit
Admission: RE | Admit: 2017-10-23 | Discharge: 2017-10-23 | Disposition: A | Discharge status: Home / Self Care | Payer: Medicare Other | Source: Ambulatory Visit | Attending: Family Medicine | Admitting: Family Medicine

## 2017-10-23 DIAGNOSIS — R4182 Altered mental status, unspecified: Secondary | ICD-10-CM | POA: Insufficient documentation

## 2017-11-02 DIAGNOSIS — R413 Other amnesia: Secondary | ICD-10-CM | POA: Insufficient documentation

## 2017-11-02 DIAGNOSIS — E538 Deficiency of other specified B group vitamins: Secondary | ICD-10-CM | POA: Insufficient documentation

## 2017-11-05 DIAGNOSIS — E349 Endocrine disorder, unspecified: Secondary | ICD-10-CM | POA: Insufficient documentation

## 2017-11-05 DIAGNOSIS — R634 Abnormal weight loss: Secondary | ICD-10-CM | POA: Insufficient documentation

## 2017-12-22 ENCOUNTER — Other Ambulatory Visit: Payer: Self-pay

## 2017-12-22 ENCOUNTER — Emergency Department: Payer: Medicare Other

## 2017-12-22 ENCOUNTER — Encounter: Payer: Self-pay | Admitting: Emergency Medicine

## 2017-12-22 ENCOUNTER — Inpatient Hospital Stay
Admission: EM | Admit: 2017-12-22 | Discharge: 2017-12-24 | DRG: 246 | Disposition: A | Discharge status: Home / Self Care | Payer: Medicare Other | Attending: Internal Medicine | Admitting: Internal Medicine

## 2017-12-22 DIAGNOSIS — Z9079 Acquired absence of other genital organ(s): Secondary | ICD-10-CM | POA: Diagnosis not present

## 2017-12-22 DIAGNOSIS — N179 Acute kidney failure, unspecified: Secondary | ICD-10-CM | POA: Diagnosis present

## 2017-12-22 DIAGNOSIS — E785 Hyperlipidemia, unspecified: Secondary | ICD-10-CM | POA: Diagnosis present

## 2017-12-22 DIAGNOSIS — N183 Chronic kidney disease, stage 3 (moderate): Secondary | ICD-10-CM | POA: Diagnosis present

## 2017-12-22 DIAGNOSIS — Z66 Do not resuscitate: Secondary | ICD-10-CM | POA: Diagnosis present

## 2017-12-22 DIAGNOSIS — N400 Enlarged prostate without lower urinary tract symptoms: Secondary | ICD-10-CM | POA: Diagnosis present

## 2017-12-22 DIAGNOSIS — I2510 Atherosclerotic heart disease of native coronary artery without angina pectoris: Secondary | ICD-10-CM | POA: Diagnosis present

## 2017-12-22 DIAGNOSIS — R413 Other amnesia: Secondary | ICD-10-CM | POA: Diagnosis present

## 2017-12-22 DIAGNOSIS — Z808 Family history of malignant neoplasm of other organs or systems: Secondary | ICD-10-CM | POA: Diagnosis not present

## 2017-12-22 DIAGNOSIS — Z882 Allergy status to sulfonamides status: Secondary | ICD-10-CM

## 2017-12-22 DIAGNOSIS — G4700 Insomnia, unspecified: Secondary | ICD-10-CM | POA: Diagnosis present

## 2017-12-22 DIAGNOSIS — I130 Hypertensive heart and chronic kidney disease with heart failure and stage 1 through stage 4 chronic kidney disease, or unspecified chronic kidney disease: Secondary | ICD-10-CM | POA: Diagnosis present

## 2017-12-22 DIAGNOSIS — I214 Non-ST elevation (NSTEMI) myocardial infarction: Principal | ICD-10-CM | POA: Diagnosis present

## 2017-12-22 DIAGNOSIS — Z79899 Other long term (current) drug therapy: Secondary | ICD-10-CM | POA: Diagnosis not present

## 2017-12-22 DIAGNOSIS — I771 Stricture of artery: Secondary | ICD-10-CM | POA: Diagnosis present

## 2017-12-22 DIAGNOSIS — I5021 Acute systolic (congestive) heart failure: Secondary | ICD-10-CM | POA: Diagnosis present

## 2017-12-22 DIAGNOSIS — I255 Ischemic cardiomyopathy: Secondary | ICD-10-CM | POA: Diagnosis present

## 2017-12-22 DIAGNOSIS — I351 Nonrheumatic aortic (valve) insufficiency: Secondary | ICD-10-CM | POA: Diagnosis not present

## 2017-12-22 DIAGNOSIS — I272 Pulmonary hypertension, unspecified: Secondary | ICD-10-CM | POA: Diagnosis present

## 2017-12-22 DIAGNOSIS — N2581 Secondary hyperparathyroidism of renal origin: Secondary | ICD-10-CM | POA: Diagnosis present

## 2017-12-22 HISTORY — DX: Disorder of thyroid, unspecified: E07.9

## 2017-12-22 LAB — TROPONIN I
TROPONIN I: 0.24 ng/mL — AB (ref <0.03)
TROPONIN I: 0.4 ng/mL — AB (ref <0.03)
TROPONIN I: 2.91 ng/mL — AB (ref <0.03)
Troponin I: 7.76 ng/mL (ref <0.03)

## 2017-12-22 LAB — CBC
HCT: 41.4 % (ref 40.0–52.0)
HEMOGLOBIN: 14.4 g/dL (ref 13.0–18.0)
MCH: 31.7 pg (ref 26.0–34.0)
MCHC: 34.7 g/dL (ref 32.0–36.0)
MCV: 91.2 fL (ref 80.0–100.0)
PLATELETS: 348 10*3/uL (ref 150–440)
RBC: 4.54 MIL/uL (ref 4.40–5.90)
RDW: 13.3 % (ref 11.5–14.5)
WBC: 9.8 10*3/uL (ref 3.8–10.6)

## 2017-12-22 LAB — BASIC METABOLIC PANEL
Anion gap: 7 (ref 5–15)
BUN: 35 mg/dL — AB (ref 8–23)
CHLORIDE: 105 mmol/L (ref 98–111)
CO2: 25 mmol/L (ref 22–32)
Calcium: 9.6 mg/dL (ref 8.9–10.3)
Creatinine, Ser: 2.3 mg/dL — ABNORMAL HIGH (ref 0.61–1.24)
GFR calc Af Amer: 31 mL/min — ABNORMAL LOW (ref >60)
GFR, EST NON AFRICAN AMERICAN: 27 mL/min — AB (ref >60)
GLUCOSE: 117 mg/dL — AB (ref 70–99)
POTASSIUM: 4.4 mmol/L (ref 3.5–5.1)
SODIUM: 137 mmol/L (ref 135–145)

## 2017-12-22 LAB — PROTIME-INR
INR: 1.06
PROTHROMBIN TIME: 13.7 s (ref 11.4–15.2)

## 2017-12-22 LAB — APTT: aPTT: 29 seconds (ref 24–36)

## 2017-12-22 MED ORDER — ASPIRIN EC 325 MG PO TBEC
325.0000 mg | DELAYED_RELEASE_TABLET | Freq: Every day | ORAL | Status: DC
  Filled 2017-12-22: qty 1

## 2017-12-22 MED ORDER — DOCUSATE SODIUM 100 MG PO CAPS: 100.0000 mg | ORAL_CAPSULE | Freq: Two times a day (BID) | ORAL | Status: DC | PRN

## 2017-12-22 MED ORDER — TRAZODONE HCL 50 MG PO TABS
50.0000 mg | ORAL_TABLET | Freq: Every day | ORAL | Status: DC
  Administered 2017-12-22: 50 mg via ORAL
  Filled 2017-12-22: qty 1

## 2017-12-22 MED ORDER — HEPARIN BOLUS VIA INFUSION
4000.0000 [IU] | Freq: Once | INTRAVENOUS | Status: AC
  Administered 2017-12-22: 4000 [IU] via INTRAVENOUS
  Filled 2017-12-22: qty 4000

## 2017-12-22 MED ORDER — CALCITRIOL 0.25 MCG PO CAPS
0.2500 ug | ORAL_CAPSULE | Freq: Every day | ORAL | Status: DC
  Administered 2017-12-22 – 2017-12-23 (×2): 0.25 ug via ORAL
  Filled 2017-12-22 (×3): qty 1

## 2017-12-22 MED ORDER — AMLODIPINE BESYLATE 5 MG PO TABS: 2.5000 mg | ORAL_TABLET | Freq: Every day | ORAL | Status: DC

## 2017-12-22 MED ORDER — HEPARIN (PORCINE) IN NACL 100-0.45 UNIT/ML-% IJ SOLN
950.0000 [IU]/h | INTRAMUSCULAR | Status: DC
  Administered 2017-12-22: 950 [IU]/h via INTRAVENOUS
  Filled 2017-12-22: qty 250

## 2017-12-22 MED ORDER — METOPROLOL SUCCINATE ER 25 MG PO TB24
25.0000 mg | ORAL_TABLET | Freq: Every day | ORAL | Status: DC
Start: 2017-12-23 — End: 2017-12-24
  Administered 2017-12-23: 25 mg via ORAL
  Filled 2017-12-22: qty 1

## 2017-12-22 MED ORDER — NITROGLYCERIN 0.4 MG SL SUBL
0.4000 mg | SUBLINGUAL_TABLET | SUBLINGUAL | Status: DC | PRN
  Administered 2017-12-22: 0.4 mg via SUBLINGUAL

## 2017-12-22 MED ORDER — ADULT MULTIVITAMIN W/MINERALS CH
ORAL_TABLET | Freq: Every day | ORAL | Status: DC
  Administered 2017-12-23 – 2017-12-24 (×2): 1 via ORAL
  Filled 2017-12-22 (×2): qty 1

## 2017-12-22 MED ORDER — DOCUSATE SODIUM 100 MG PO CAPS
200.0000 mg | ORAL_CAPSULE | Freq: Two times a day (BID) | ORAL | Status: DC
  Filled 2017-12-22: qty 2

## 2017-12-22 MED ORDER — LOSARTAN POTASSIUM 25 MG PO TABS
25.0000 mg | ORAL_TABLET | Freq: Every day | ORAL | Status: DC
  Administered 2017-12-22: 25 mg via ORAL
  Filled 2017-12-22: qty 1

## 2017-12-22 MED ORDER — VITAMIN B-12 1000 MCG PO TABS
1000.0000 ug | ORAL_TABLET | Freq: Every day | ORAL | Status: DC
  Administered 2017-12-23 – 2017-12-24 (×2): 1000 ug via ORAL
  Filled 2017-12-22 (×2): qty 1

## 2017-12-22 NOTE — H&P (Signed)
[facility] [place] [facility]   PATIENT NAME: Jesus Bolton    MR#:  [id number]  DATE OF BIRTH:  1945/10/22  DATE OF ADMISSION:  12/22/2017  PRIMARY CARE PHYSICIAN: Dione Housekeeper, MD   REQUESTING/REFERRING PHYSICIAN: [email or web address]  CHIEF COMPLAINT:   Chief Complaint  Patient presents with  . Chest Pain    HISTORY OF PRESENT ILLNESS: Jesus Bolton  is a 72 y.o. male with a known history of BPH, Htn, thyroid disease, CKD- was doing power wash his house and started upper chest pressure like pain. He took some rest and took aspirin, felt better. In ER noted to have elevated troponin, which went even higher on follow up. EKG,not significant. ER physician spoke to Cardiologist and he suggested heparine drip for NSTEMI.   PAST MEDICAL HISTORY:   Past Medical History:  Diagnosis Date  . BPH (benign prostatic hyperplasia)   . Elevated PSA   . Hypertension   . Indigestion   . Renal disorder   . Thyroid disease     PAST SURGICAL HISTORY:  Past Surgical History:  Procedure Laterality Date  . GREEN LIGHT LASER TURP (TRANSURETHRAL RESECTION OF PROSTATE N/A 01/02/2015   Procedure: GREEN LIGHT LASER TURP (TRANSURETHRAL RESECTION OF PROSTATE;  Surgeon: Orson Ape, MD;  Location: [facility] ORS;  Service: Urology;  Laterality: N/A;    SOCIAL HISTORY:  Social History   Tobacco Use  . Smoking status: Never Smoker  . Smokeless tobacco: Never Used  Substance Use Topics  . Alcohol use: No    FAMILY HISTORY:  Family History  Problem Relation Age of Onset  . Cancer Mother     DRUG ALLERGIES:  Allergies  Allergen Reactions  . Sulfa Antibiotics Rash    REVIEW OF SYSTEMS:   CONSTITUTIONAL: No fever, fatigue or weakness.  EYES: No blurred or double vision.  EARS, NOSE, AND THROAT: No tinnitus or ear pain.  RESPIRATORY: No cough, shortness of breath, wheezing or hemoptysis.  CARDIOVASCULAR: have chest pain, no orthopnea, edema.  GASTROINTESTINAL: No nausea,  vomiting, diarrhea or abdominal pain.  GENITOURINARY: No dysuria, hematuria.  ENDOCRINE: No polyuria, nocturia,  HEMATOLOGY: No anemia, easy bruising or bleeding SKIN: No rash or lesion. MUSCULOSKELETAL: No joint pain or arthritis.   NEUROLOGIC: No tingling, numbness, weakness.  PSYCHIATRY: No anxiety or depression.   MEDICATIONS AT HOME:  Prior to Admission medications   Medication Sig Start Date End Date Taking? Authorizing Provider  amLODipine (NORVASC) 2.5 MG tablet Take 2.5 mg by mouth daily.    Yes [provider]  calcitRIOL (ROCALTROL) 0.25 MCG capsule Take 0.25 mcg by mouth daily. 12/09/17  Yes [provider]  cyanocobalamin 1000 MCG tablet Take 1,000 mcg by mouth daily.   Yes [provider]  docusate sodium (COLACE) 100 MG capsule Take 2 capsules (200 mg total) by mouth 2 (two) times daily. 01/02/15  Yes Orson Ape, MD  losartan (COZAAR) 25 MG tablet Take 25 mg by mouth at bedtime. 12/09/17  Yes [provider]  metoprolol succinate (TOPROL-XL) 50 MG 24 hr tablet Take 25 mg by mouth daily. 12/09/17  Yes [provider]  Multiple Vitamins-Minerals (CENTRUM SILVER 50+MEN) TABS Take 1 tablet by mouth daily.   Yes [provider]  traZODone (DESYREL) 50 MG tablet Take 50 mg by mouth at bedtime. 12/03/17  Yes [provider]  guaiFENesin-codeine 100-10 MG/5ML syrup 10 ml po 8 hours prn cough Patient not taking: Reported on 12/22/2017 07/29/15   Conty,  Pamala Hurry, MD  Meth-Hyo-M Salley Hews Phos-Ph Sal (URIBEL) 118 MG CAPS Take 1 capsule (118 mg total) by mouth every 6 (six) hours as needed. Patient not taking: Reported on 12/22/2017 01/02/15   Orson Ape, MD  NUCYNTA 50 MG TABS tablet Take 1 tablet (50 mg total) by mouth every 6 (six) hours as needed. 1 TO 2 TABS Q 6 HOURS PRN PAIN Patient not taking: Reported on 12/22/2017 01/02/15   Orson Ape, MD  ondansetron (ZOFRAN ODT) 8 MG disintegrating tablet Take 1 tablet (8 mg  total) by mouth every 6 (six) hours as needed for nausea or vomiting. Patient not taking: Reported on 12/22/2017 01/02/15   Orson Ape, MD      PHYSICAL EXAMINATION:   VITAL SIGNS: Blood pressure (!) 157/93, pulse 71, temperature 98 F (36.7 C), temperature source Oral, resp. rate 12, height 6' (1.829 m), weight 78.5 kg (173 lb), SpO2 98 %.  GENERAL:  72 y.o.-year-old patient lying in the bed with no acute distress.  EYES: Pupils equal, round, reactive to light and accommodation. No scleral icterus. Extraocular muscles intact.  HEENT: Head atraumatic, normocephalic. Oropharynx and nasopharynx clear.  NECK:  Supple, no jugular venous distention. No thyroid enlargement, no tenderness.  LUNGS: Normal breath sounds bilaterally, no wheezing, rales,rhonchi or crepitation. No use of accessory muscles of respiration.  CARDIOVASCULAR: S1, S2 normal. No murmurs, rubs, or gallops.  ABDOMEN: Soft, nontender, nondistended. Bowel sounds present. No organomegaly or mass.  EXTREMITIES: No pedal edema, cyanosis, or clubbing.  NEUROLOGIC: Cranial nerves II through XII are intact. Muscle strength 5/5 in all extremities. Sensation intact. Gait not checked.  PSYCHIATRIC: The patient is alert and oriented x 3.  SKIN: No obvious rash, lesion, or ulcer.   LABORATORY PANEL:   CBC Recent Labs  Lab 12/22/17 1456  WBC 9.8  HGB 14.4  HCT 41.4  PLT 348  MCV 91.2  MCH 31.7  MCHC 34.7  RDW 13.3   ------------------------------------------------------------------------------------------------------------------  Chemistries  Recent Labs  Lab 12/22/17 1456  NA 137  K 4.4  CL 105  CO2 25  GLUCOSE 117*  BUN 35*  CREATININE 2.30*  CALCIUM 9.6   ------------------------------------------------------------------------------------------------------------------ estimated creatinine clearance is 31.9 mL/min (A) (by C-G formula based on SCr of 2.3 mg/dL  (H)). ------------------------------------------------------------------------------------------------------------------ No results for input(s): TSH, T4TOTAL, T3FREE, THYROIDAB in the last 72 hours.  Invalid input(s): FREET3   Coagulation profile Recent Labs  Lab 12/22/17 1624  INR 1.06   ------------------------------------------------------------------------------------------------------------------- No results for input(s): DDIMER in the last 72 hours. -------------------------------------------------------------------------------------------------------------------  Cardiac Enzymes Recent Labs  Lab 12/22/17 1456 12/22/17 1624  TROPONINI 0.24* 0.40*   ------------------------------------------------------------------------------------------------------------------ Invalid input(s): POCBNP  ---------------------------------------------------------------------------------------------------------------  Urinalysis No results found for: COLORURINE, APPEARANCEUR, LABSPEC, PHURINE, GLUCOSEU, HGBUR, BILIRUBINUR, KETONESUR, PROTEINUR, UROBILINOGEN, NITRITE, LEUKOCYTESUR   RADIOLOGY: Dg Chest 2 View  Result Date: 12/22/2017 CLINICAL DATA:  Upper chest pain EXAM: CHEST - 2 VIEW COMPARISON:  None. FINDINGS: Normal mediastinum and cardiac silhouette. Normal pulmonary vasculature. No evidence of effusion, infiltrate, or pneumothorax. No acute bony abnormality. Degenerative osteophytosis of the spine. IMPRESSION: No acute cardiopulmonary process. Electronically Signed   By: Genevive Bi M.D.   On: 12/22/2017 15:18    EKG: Orders placed or performed during the hospital encounter of 12/22/17  . EKG 12-Lead  . EKG 12-Lead  . ED EKG within 10 minutes  . ED EKG within 10 minutes  . EKG 12-Lead  . EKG 12-Lead    IMPRESSION AND PLAN:  *Non-ST elevation  MI IV heparin drip Monitor on telemetry and follow serial troponin. Check lipid panel and hemoglobin A1c. Cardiology  consult.  *Hypertension Continue home medication.  *Chronic kidney disease stage III Monitor renal function and get nephrology consult as patient may need catheterization.   All the records are reviewed and case discussed with ED provider. Management plans discussed with the patient, family and they are in agreement.  CODE STATUS: DNR  Discussed with his wife in room.  TOTAL TIME TAKING CARE OF THIS PATIENT: 45 minutes.    Altamese Dilling M.D on 12/22/2017   Between 7am to 6pm - Pager - [phone]  After 6pm go to [email or web address] - password [email or web address]  [facility]  Office  [phone]  CC: Primary care physician; Dione Housekeeper, MD   Note: This dictation was prepared with Dragon dictation along with smaller phrase technology. Any transcriptional errors that result from this process are unintentional.

## 2017-12-22 NOTE — Progress Notes (Signed)
Family Meeting Note  Advance Directive:yes  Today a meeting took place with the Patient and spouse.  The following clinical team members were present during this meeting:MD  The following were discussed:Patient's diagnosis: Htn, CAD , Patient's progosis: Unable to determine and Goals for treatment: DNR  Additional follow-up to be provided: cardiology consult  Time spent during discussion:20 minutes  Altamese Dilling, MD

## 2017-12-22 NOTE — ED Provider Notes (Addendum)
[facility] Emergency Department Provider Note  Time seen: 4:04 PM  I have reviewed the triage vital signs and the nursing notes.   HISTORY  Chief Complaint Chest Pain    HPI Jesus Bolton is a 72 y.o. male with a past medical history of hypertension indigestion anxiety, possible TIA, presents to the emergency department for chest pain.  According to the patient he was outside pressure washing his house developed chest pressure across his upper chest radiating into both shoulders and down into his left bicep per patient.  States the pain has alleviated considerably he took 325 mg aspirin at home but states discontinued 3 or 4 dull aching pain across the upper chest currently.  Denies any shortness of breath, or nausea.  Patient states he did not want to come to the emergency department but his wife made him.   Past Medical History:  Diagnosis Date  . BPH (benign prostatic hyperplasia)   . Elevated PSA   . Hypertension   . Indigestion   . Renal disorder   . Thyroid disease     There are no active problems to display for this patient.   Past Surgical History:  Procedure Laterality Date  . GREEN LIGHT LASER TURP (TRANSURETHRAL RESECTION OF PROSTATE N/A 01/02/2015   Procedure: GREEN LIGHT LASER TURP (TRANSURETHRAL RESECTION OF PROSTATE;  Surgeon: Orson Ape, MD;  Location: [facility] ORS;  Service: Urology;  Laterality: N/A;    Prior to Admission medications   Medication Sig Start Date End Date Taking? Authorizing Provider  amLODipine (NORVASC) 5 MG tablet Take 5 mg by mouth daily.    [provider]  docusate sodium (COLACE) 100 MG capsule Take 2 capsules (200 mg total) by mouth 2 (two) times daily. 01/02/15   Orson Ape, MD  finasteride (PROSCAR) 5 MG tablet Take 5 mg by mouth daily.    [provider]  guaiFENesin-codeine 100-10 MG/5ML syrup 10 ml po 8 hours prn cough 07/29/15   Payton Mccallum, MD  levofloxacin (LEVAQUIN) 500 MG  tablet Take 1 tablet (500 mg total) by mouth daily. 01/02/15   Orson Ape, MD  Meth-Hyo-M Salley Hews Phos-Ph Sal (URIBEL) 118 MG CAPS Take 1 capsule (118 mg total) by mouth every 6 (six) hours as needed. 01/02/15   Orson Ape, MD  metoprolol succinate (TOPROL-XL) 100 MG 24 hr tablet Take 50 mg by mouth daily. Take with or immediately following a meal.    [provider]  NUCYNTA 50 MG TABS tablet Take 1 tablet (50 mg total) by mouth every 6 (six) hours as needed. 1 TO 2 TABS Q 6 HOURS PRN PAIN 01/02/15   Orson Ape, MD  ondansetron (ZOFRAN ODT) 8 MG disintegrating tablet Take 1 tablet (8 mg total) by mouth every 6 (six) hours as needed for nausea or vomiting. 01/02/15   Orson Ape, MD  Probiotic Product (PROBIOTIC DAILY PO) Take by mouth.    [provider]  tamsulosin (FLOMAX) 0.4 MG CAPS capsule Take 0.4 mg by mouth.    [provider]    Allergies  Allergen Reactions  . Sulfa Antibiotics Rash    Family History  Problem Relation Age of Onset  . Cancer Mother     Social History Social History   Tobacco Use  . Smoking status: Never Smoker  . Smokeless tobacco: Never Used  Substance Use Topics  . Alcohol use: No  . Drug use: No    Review of Systems  Constitutional: Negative for fever. Cardiovascular: Chest pain radiating into left arm Respiratory: Negative for shortness of breath. Gastrointestinal: Negative for abdominal pain, vomiting  Genitourinary: Negative for urinary compaints Musculoskeletal: Negative for leg pain or swelling. Skin: Negative for skin complaints  Neurological: Negative for headache All other ROS negative  ____________________________________________   PHYSICAL EXAM:  VITAL SIGNS: ED Triage Vitals  Enc Vitals Group     BP 12/22/17 1453 120/84     Pulse Rate 12/22/17 1453 80     Resp 12/22/17 1453 16     Temp 12/22/17 1453 98 F (36.7 C)     Temp Source 12/22/17 1453 Oral     SpO2 12/22/17 1453 100 %      Weight 12/22/17 1454 173 lb (78.5 kg)     Height 12/22/17 1454 6' (1.829 m)     Head Circumference --      Peak Flow --      Pain Score 12/22/17 1452 4     Pain Loc --      Pain Edu? --      Excl. in GC? --    Constitutional: Alert and oriented. Well appearing and in no distress. Eyes: Normal exam ENT   Head: Normocephalic and atraumatic.   Mouth/Throat: Mucous membranes are moist. Cardiovascular: Normal rate, regular rhythm. No murmur Respiratory: Normal respiratory effort without tachypnea nor retractions. Breath sounds are clear  Gastrointestinal: Soft and nontender. No distention.  Musculoskeletal: Nontender with normal range of motion in all extremities. No lower extremity tenderness or edema. Neurologic:  Normal speech and language. No gross focal neurologic deficits Skin:  Skin is warm, dry and intact.  Psychiatric: Mood and affect are normal.   ____________________________________________    EKG  EKG reviewed and interpreted by myself shows normal sinus rhythm 84 bpm with a narrow QRS, normal axis, normal intervals, no concerning ST changes.  Repeat EKG 15: 48: 12 reviewed and interpreted by myself shows normal sinus rhythm at 71 bpm with a narrow QRS patient does have very slight elevation in lead III and possibly slight elevation in aVF less than 1 mm, no reciprocal depressions.  Does not meet STEMI criteria.  ____________________________________________    RADIOLOGY  Chest x-ray negative  ____________________________________________   INITIAL IMPRESSION / ASSESSMENT AND PLAN / ED COURSE  Pertinent labs & imaging results that were available during my care of the patient were reviewed by me and considered in my medical decision making (see chart for details).  Patient presents to the emergency department with acute onset of chest pain radiating into the left arm while pressure washing his house.  Differential would include ACS, chest wall pain, muscular  skeletal pain.  We will check labs, chest x-ray and continue to closely monitor.  Patient's labs show an elevation of troponin 0.24, also his creatinine is elevated normal baseline is around 1.5-2.0 currently 2.3.  Troponin elevation could be related to renal dysfunction however given the patient's acute onset of chest pain highly suspect ACS.  We will start the patient on a heparin infusion.  EKG is slightly abnormal does not meet STEMI criteria.  We will repeat a troponin at this time to see if there is any change over the past 1 and half hours.  Patient will require admission to the hospitalist service.  If there is significant elevation in the repeat troponin we will discuss with cardiology for more urgent catheterization.  Troponin elevated from 0.24-0.4 and a little over 1 hour.  I discussed the  patient with Dr. Lady Gary, his EKG does not meet STEMI criteria recommends continuing heparin and they will likely perform a cardiac catheterization tomorrow.  Patient admitted to the hospitalist.  CRITICAL CARE Performed by: Minna Antis   Total critical care time: 30 minutes  Critical care time was exclusive of separately billable procedures and treating other patients.  Critical care was necessary to treat or prevent imminent or life-threatening deterioration.  Critical care was time spent personally by me on the following activities: development of treatment plan with patient and/or surrogate as well as nursing, discussions with consultants, evaluation of patient's response to treatment, examination of patient, obtaining history from patient or surrogate, ordering and performing treatments and interventions, ordering and review of laboratory studies, ordering and review of radiographic studies, pulse oximetry and re-evaluation of patient's condition.   ____________________________________________   FINAL CLINICAL IMPRESSION(S) / ED DIAGNOSES  Chest pain NSTEMI   Minna Antis,  MD 12/22/17 1610    Minna Antis, MD 12/22/17 [phone]

## 2017-12-22 NOTE — ED Triage Notes (Signed)
PT to ED via POV with c/o CP that started while mowing lawn this afternoon. Pt took 325asp PTA. PT in NAD, denies any other symptoms.

## 2017-12-22 NOTE — ED Notes (Signed)
Admitting MD at bedside at this time. Bolus of 4,000 units and continuous infusion of 950 units/hr verified with Hilbert Corrigan, Charity fundraiser.

## 2017-12-22 NOTE — Consult Note (Signed)
ANTICOAGULATION CONSULT NOTE - Initial Consult  Pharmacy Consult for Heparin Drip  Indication: chest pain/ACS  Allergies  Allergen Reactions  . Sulfa Antibiotics Rash    Patient Measurements: Height: 6' (182.9 cm) Weight: 173 lb (78.5 kg) IBW/kg (Calculated) : 77.6  Vital Signs: Temp: 98 F (36.7 C) (07/09 1453) Temp Source: Oral (07/09 1453) BP: 144/79 (07/09 1545) Pulse Rate: 74 (07/09 1545)  Labs: Recent Labs    12/22/17 1456  HGB 14.4  HCT 41.4  PLT 348  CREATININE 2.30*  TROPONINI 0.24*    Estimated Creatinine Clearance: 31.9 mL/min (A) (by C-G formula based on SCr of 2.3 mg/dL (H)).   Medical History: Past Medical History:  Diagnosis Date  . BPH (benign prostatic hyperplasia)   . Elevated PSA   . Hypertension   . Indigestion   . Renal disorder   . Thyroid disease     Assessment: Pharmacy consulted for heparin dosing and monitoring for 72 yo male with Chest Pain/NSTEMI.  Goal of Therapy:  Heparin level 0.3-0.7 units/ml Monitor platelets by anticoagulation protocol: Yes   Plan:  Baseline labs ordered Give 4000 units bolus x 1 Start heparin infusion at 950 units/hr Check anti-Xa level in 8 hours and daily while on heparin Continue to monitor H&H and platelets  Gardner Candle, PharmD, BCPS Clinical Pharmacist 12/22/2017 4:21 PM

## 2017-12-22 NOTE — ED Notes (Signed)
Pt ambulatory to use restroom, urine sample collected. RN notified

## 2017-12-22 NOTE — ED Notes (Addendum)
Pt reports taking three 81-mg aspirin tablets when his chest pain began today while mowing the yard. Did not give pt the 325 mg for this reason. Primary RN aware.

## 2017-12-23 ENCOUNTER — Encounter
Admission: EM | Disposition: A | Discharge status: Home / Self Care | Payer: Self-pay | Source: Home / Self Care | Attending: Internal Medicine

## 2017-12-23 ENCOUNTER — Inpatient Hospital Stay (HOSPITAL_COMMUNITY)
Admit: 2017-12-23 | Discharge: 2017-12-23 | Disposition: A | Discharge status: Home / Self Care | Payer: Medicare Other | Attending: Internal Medicine | Admitting: Internal Medicine

## 2017-12-23 ENCOUNTER — Other Ambulatory Visit: Payer: Self-pay

## 2017-12-23 ENCOUNTER — Encounter: Payer: Self-pay | Admitting: Physician Assistant

## 2017-12-23 DIAGNOSIS — N179 Acute kidney failure, unspecified: Secondary | ICD-10-CM

## 2017-12-23 DIAGNOSIS — I214 Non-ST elevation (NSTEMI) myocardial infarction: Secondary | ICD-10-CM

## 2017-12-23 DIAGNOSIS — I351 Nonrheumatic aortic (valve) insufficiency: Secondary | ICD-10-CM

## 2017-12-23 DIAGNOSIS — I2510 Atherosclerotic heart disease of native coronary artery without angina pectoris: Secondary | ICD-10-CM

## 2017-12-23 DIAGNOSIS — N183 Chronic kidney disease, stage 3 (moderate): Secondary | ICD-10-CM

## 2017-12-23 HISTORY — PX: LEFT HEART CATH AND CORONARY ANGIOGRAPHY: CATH118249

## 2017-12-23 LAB — BASIC METABOLIC PANEL
Anion gap: 8 (ref 5–15)
BUN: 35 mg/dL — AB (ref 8–23)
CO2: 24 mmol/L (ref 22–32)
CREATININE: 2.12 mg/dL — AB (ref 0.61–1.24)
Calcium: 9.2 mg/dL (ref 8.9–10.3)
Chloride: 109 mmol/L (ref 98–111)
GFR calc Af Amer: 34 mL/min — ABNORMAL LOW (ref >60)
GFR calc non Af Amer: 29 mL/min — ABNORMAL LOW (ref >60)
Glucose, Bld: 82 mg/dL (ref 70–99)
Potassium: 4.3 mmol/L (ref 3.5–5.1)
SODIUM: 141 mmol/L (ref 135–145)

## 2017-12-23 LAB — LIPID PANEL
CHOL/HDL RATIO: 5.1 ratio
CHOLESTEROL: 163 mg/dL (ref 0–200)
HDL: 32 mg/dL — AB (ref >40)
LDL Cholesterol: 95 mg/dL (ref 0–99)
Triglycerides: 179 mg/dL — ABNORMAL HIGH (ref <150)
VLDL: 36 mg/dL (ref 0–40)

## 2017-12-23 LAB — TROPONIN I
TROPONIN I: 10.66 ng/mL — AB (ref <0.03)
TROPONIN I: 9.1 ng/mL — AB (ref <0.03)

## 2017-12-23 LAB — HEPATIC FUNCTION PANEL
ALBUMIN: 3.8 g/dL (ref 3.5–5.0)
ALT: 17 U/L (ref 0–44)
AST: 47 U/L — AB (ref 15–41)
Alkaline Phosphatase: 71 U/L (ref 38–126)
BILIRUBIN TOTAL: 0.9 mg/dL (ref 0.3–1.2)
Bilirubin, Direct: 0.1 mg/dL (ref 0.0–0.2)
Indirect Bilirubin: 0.8 mg/dL (ref 0.3–0.9)
TOTAL PROTEIN: 6.5 g/dL (ref 6.5–8.1)

## 2017-12-23 LAB — POCT ACTIVATED CLOTTING TIME
Activated Clotting Time: 241 seconds
Activated Clotting Time: 257 seconds

## 2017-12-23 LAB — ECHOCARDIOGRAM COMPLETE
Height: 72 in
WEIGHTICAEL: 2761.6 [oz_av]

## 2017-12-23 LAB — CBC
HCT: 40.6 % (ref 40.0–52.0)
Hemoglobin: 13.9 g/dL (ref 13.0–18.0)
MCH: 31.5 pg (ref 26.0–34.0)
MCHC: 34.3 g/dL (ref 32.0–36.0)
MCV: 92 fL (ref 80.0–100.0)
PLATELETS: 321 10*3/uL (ref 150–440)
RBC: 4.41 MIL/uL (ref 4.40–5.90)
RDW: 13.4 % (ref 11.5–14.5)
WBC: 7.9 10*3/uL (ref 3.8–10.6)

## 2017-12-23 LAB — HEPARIN LEVEL (UNFRACTIONATED)
HEPARIN UNFRACTIONATED: 0.19 [IU]/mL — AB (ref 0.30–0.70)
Heparin Unfractionated: 0.32 IU/mL (ref 0.30–0.70)
Heparin Unfractionated: 0.4 [IU]/mL (ref 0.30–0.70)

## 2017-12-23 LAB — CREATININE, SERUM
Creatinine, Ser: 1.95 mg/dL — ABNORMAL HIGH (ref 0.61–1.24)
GFR calc Af Amer: 38 mL/min — ABNORMAL LOW (ref >60)
GFR, EST NON AFRICAN AMERICAN: 33 mL/min — AB (ref >60)

## 2017-12-23 SURGERY — LEFT HEART CATH AND CORONARY ANGIOGRAPHY
Anesthesia: Moderate Sedation

## 2017-12-23 MED ORDER — SODIUM CHLORIDE 0.9 % IV SOLN
INTRAVENOUS | Status: DC
  Administered 2017-12-23: 09:00:00 via INTRAVENOUS

## 2017-12-23 MED ORDER — SODIUM CHLORIDE 0.9 % IV BOLUS
250.0000 mL | INTRAVENOUS | Status: AC
  Administered 2017-12-23: 250 mL via INTRAVENOUS

## 2017-12-23 MED ORDER — TICAGRELOR 90 MG PO TABS
ORAL_TABLET | ORAL | Status: DC | PRN
  Administered 2017-12-23: 180 mg via ORAL

## 2017-12-23 MED ORDER — SODIUM CHLORIDE 0.9 % IV SOLN: 250.0000 mL | INTRAVENOUS | Status: DC | PRN

## 2017-12-23 MED ORDER — MIDAZOLAM HCL 2 MG/2ML IJ SOLN
INTRAMUSCULAR | Status: AC
  Filled 2017-12-23: qty 2

## 2017-12-23 MED ORDER — HEPARIN SODIUM (PORCINE) 5000 UNIT/ML IJ SOLN
5000.0000 [IU] | Freq: Three times a day (TID) | INTRAMUSCULAR | Status: DC
  Administered 2017-12-23 – 2017-12-24 (×2): 5000 [IU] via SUBCUTANEOUS
  Filled 2017-12-23 (×2): qty 1

## 2017-12-23 MED ORDER — AMLODIPINE BESYLATE 5 MG PO TABS
5.0000 mg | ORAL_TABLET | Freq: Every day | ORAL | Status: DC
  Administered 2017-12-23: 5 mg via ORAL
  Filled 2017-12-23: qty 1

## 2017-12-23 MED ORDER — LIDOCAINE HCL (PF) 1 % IJ SOLN
INTRAMUSCULAR | Status: AC
  Filled 2017-12-23: qty 30

## 2017-12-23 MED ORDER — TICAGRELOR 90 MG PO TABS
90.0000 mg | ORAL_TABLET | Freq: Two times a day (BID) | ORAL | Status: DC
  Administered 2017-12-24: 90 mg via ORAL
  Filled 2017-12-23: qty 1

## 2017-12-23 MED ORDER — TICAGRELOR 90 MG PO TABS
ORAL_TABLET | ORAL | Status: AC
  Filled 2017-12-23: qty 2

## 2017-12-23 MED ORDER — HYDRALAZINE HCL 20 MG/ML IJ SOLN
INTRAMUSCULAR | Status: AC
  Filled 2017-12-23: qty 1

## 2017-12-23 MED ORDER — HEPARIN SODIUM (PORCINE) 1000 UNIT/ML IJ SOLN
INTRAMUSCULAR | Status: AC
  Filled 2017-12-23: qty 1

## 2017-12-23 MED ORDER — ASPIRIN 81 MG PO CHEW
81.0000 mg | CHEWABLE_TABLET | Freq: Every day | ORAL | Status: DC
  Administered 2017-12-24: 81 mg via ORAL
  Filled 2017-12-23: qty 1

## 2017-12-23 MED ORDER — FENTANYL CITRATE (PF) 100 MCG/2ML IJ SOLN
INTRAMUSCULAR | Status: AC
  Filled 2017-12-23: qty 2

## 2017-12-23 MED ORDER — NITROGLYCERIN 1 MG/10 ML FOR IR/CATH LAB
INTRA_ARTERIAL | Status: DC | PRN
  Administered 2017-12-23 (×2): 200 ug via INTRACORONARY

## 2017-12-23 MED ORDER — MIDAZOLAM HCL 2 MG/2ML IJ SOLN
INTRAMUSCULAR | Status: DC | PRN
  Administered 2017-12-23: 1 mg via INTRAVENOUS

## 2017-12-23 MED ORDER — VERAPAMIL HCL 2.5 MG/ML IV SOLN
INTRAVENOUS | Status: AC
  Filled 2017-12-23: qty 2

## 2017-12-23 MED ORDER — HEPARIN (PORCINE) IN NACL 1000-0.9 UT/500ML-% IV SOLN
INTRAVENOUS | Status: AC
  Filled 2017-12-23: qty 1000

## 2017-12-23 MED ORDER — SODIUM CHLORIDE 0.9% FLUSH
3.0000 mL | Freq: Two times a day (BID) | INTRAVENOUS | Status: DC
  Administered 2017-12-23: 3 mL via INTRAVENOUS

## 2017-12-23 MED ORDER — HYDRALAZINE HCL 20 MG/ML IJ SOLN
5.0000 mg | INTRAMUSCULAR | Status: AC | PRN
  Administered 2017-12-23: 5 mg via INTRAVENOUS

## 2017-12-23 MED ORDER — SODIUM CHLORIDE 0.9 % IV SOLN: INTRAVENOUS | Status: AC

## 2017-12-23 MED ORDER — VERAPAMIL HCL 2.5 MG/ML IV SOLN
INTRAVENOUS | Status: DC | PRN
  Administered 2017-12-23: 2.5 mg via INTRA_ARTERIAL

## 2017-12-23 MED ORDER — IOPAMIDOL (ISOVUE-300) INJECTION 61%
INTRAVENOUS | Status: DC | PRN
  Administered 2017-12-23: 60 mL via INTRA_ARTERIAL

## 2017-12-23 MED ORDER — TRAZODONE HCL 100 MG PO TABS
100.0000 mg | ORAL_TABLET | Freq: Every day | ORAL | Status: DC
  Administered 2017-12-23: 100 mg via ORAL
  Filled 2017-12-23 (×2): qty 1

## 2017-12-23 MED ORDER — ATORVASTATIN CALCIUM 80 MG PO TABS
80.0000 mg | ORAL_TABLET | Freq: Every day | ORAL | Status: DC
  Filled 2017-12-23 (×2): qty 1

## 2017-12-23 MED ORDER — SODIUM CHLORIDE 0.9% FLUSH: 3.0000 mL | INTRAVENOUS | Status: DC | PRN

## 2017-12-23 MED ORDER — LABETALOL HCL 5 MG/ML IV SOLN: 10.0000 mg | INTRAVENOUS | Status: AC | PRN

## 2017-12-23 MED ORDER — NITROGLYCERIN 5 MG/ML IV SOLN
INTRAVENOUS | Status: AC
  Filled 2017-12-23: qty 10

## 2017-12-23 MED ORDER — HEPARIN SODIUM (PORCINE) 1000 UNIT/ML IJ SOLN
INTRAMUSCULAR | Status: DC | PRN
  Administered 2017-12-23: 4000 [IU] via INTRAVENOUS
  Administered 2017-12-23: 2000 [IU] via INTRAVENOUS
  Administered 2017-12-23: 5000 [IU] via INTRAVENOUS
  Administered 2017-12-23: 3000 [IU] via INTRAVENOUS

## 2017-12-23 MED ORDER — SODIUM CHLORIDE 0.9% FLUSH: 3.0000 mL | Freq: Two times a day (BID) | INTRAVENOUS | Status: DC

## 2017-12-23 MED ORDER — ASPIRIN 81 MG PO CHEW
CHEWABLE_TABLET | ORAL | Status: DC | PRN
  Administered 2017-12-23: 81 mg via ORAL

## 2017-12-23 MED ORDER — SODIUM CHLORIDE 0.9 % WEIGHT BASED INFUSION: 3.0000 mL/kg/h | INTRAVENOUS | Status: DC

## 2017-12-23 MED ORDER — SODIUM CHLORIDE 0.9 % WEIGHT BASED INFUSION: 1.0000 mL/kg/h | INTRAVENOUS | Status: DC

## 2017-12-23 MED ORDER — FENTANYL CITRATE (PF) 100 MCG/2ML IJ SOLN
INTRAMUSCULAR | Status: DC | PRN
  Administered 2017-12-23: 50 ug via INTRAVENOUS

## 2017-12-23 MED ORDER — ASPIRIN 81 MG PO CHEW
CHEWABLE_TABLET | ORAL | Status: AC
  Filled 2017-12-23: qty 1

## 2017-12-23 SURGICAL SUPPLY — 18 items
BALLN TREK RX 2.5X8 (BALLOONS) ×3
BALLN ~~LOC~~ TREK RX 3.75X8 (BALLOONS) ×3
BALLOON TREK RX 2.5X8 (BALLOONS) ×1 IMPLANT
BALLOON ~~LOC~~ TREK RX 3.75X8 (BALLOONS) ×1 IMPLANT
CATH INFINITI 5 FR JL3.5 (CATHETERS) ×3 IMPLANT
CATH INFINITI 5FR ANG PIGTAIL (CATHETERS) ×3 IMPLANT
CATH INFINITI JR4 5F (CATHETERS) ×3 IMPLANT
CATH VISTA GUIDE 6FR JR4 (CATHETERS) ×3 IMPLANT
DEVICE INFLAT 30 PLUS (MISCELLANEOUS) ×3 IMPLANT
DEVICE RAD TR BAND REGULAR (VASCULAR PRODUCTS) ×3 IMPLANT
KIT MANI 3VAL PERCEP (MISCELLANEOUS) ×3 IMPLANT
NEEDLE PERC 21GX4CM (NEEDLE) ×3 IMPLANT
PACK CARDIAC CATH (CUSTOM PROCEDURE TRAY) ×3 IMPLANT
SHEATH RAIN RADIAL 21G 6FR (SHEATH) ×3 IMPLANT
STENT SIERRA 3.25 X 12 MM (Permanent Stent) ×3 IMPLANT
WIRE G HI TQ BMW 190 (WIRE) ×3 IMPLANT
WIRE HITORQ VERSACORE ST 145CM (WIRE) ×3 IMPLANT
WIRE ROSEN-J .035X260CM (WIRE) ×3 IMPLANT

## 2017-12-23 NOTE — Progress Notes (Signed)
[facility]  Date: 12/23/17 Time: 9:06 AM  In anticipation of LHC today, goals of care and code status were discussed.  Patient states that he would not wish to be maintained on life support indefinitely.  However, if his condition is not terminal, he would like to have all resuscitative measures including CPR and intubation attempted.  I will change his code status to full code.  Yvonne Kendall, MD [facility] Pager: [phone]

## 2017-12-23 NOTE — Progress Notes (Signed)
*  PRELIMINARY RESULTS* Echocardiogram 2D Echocardiogram has been performed.  Joanette Gula Amiliana Foutz 12/23/2017, 10:28 AM

## 2017-12-23 NOTE — Progress Notes (Signed)
[facility]  ROUNDING NOTE   Subjective:  Patient very well-known to [place]. We follow him for outpatient chronic kidney disease stage III with a baseline creatinine of 1.46 with a EGFR of 47. He presents now with non-ST elevation myocardial infarction. Patient going for cardiac catheterization today. He has been started on pre-contrast exposure IV fluids.   Objective:  Vital signs in last 24 hours:  Temp:  [97.7 F (36.5 C)-98.4 F (36.9 C)] 98.2 F (36.8 C) (07/10 1157) Pulse Rate:  [60-90] 75 (07/10 1500) Resp:  [12-21] 13 (07/10 1500) BP: (127-174)/(71-104) 165/96 (07/10 1500) SpO2:  [94 %-100 %] 100 % (07/10 1500) Weight:  [78 kg (172 lb)-79 kg (174 lb 3.2 oz)] 78 kg (172 lb) (07/10 1157)  Weight change:  Filed Weights   12/22/17 2020 12/23/17 0512 12/23/17 1157  Weight: 79 kg (174 lb 3.2 oz) 78.3 kg (172 lb 9.6 oz) 78 kg (172 lb)    Intake/Output: I/O last 3 completed shifts: In: 108.6 [I.V.:108.6] Out: 700 [Urine:700]   Intake/Output this shift:  No intake/output data recorded.  Physical Exam: General: No acute distress  Head: Normocephalic, atraumatic. Moist oral mucosal membranes  Eyes: Anicteric  Neck: Supple, trachea midline  Lungs:  Clear to auscultation, normal effort  Heart: S1S2 no rubs  Abdomen:  Soft, nontender, bowel sounds present  Extremities: No peripheral edema.  Neurologic: Awake, alert, following commands  Skin: No lesions       Basic Metabolic Panel: Recent Labs  Lab 12/22/17 1456 12/23/17 0211 12/23/17 0527  NA 137 141  --   K 4.4 4.3  --   CL 105 109  --   CO2 25 24  --   GLUCOSE 117* 82  --   BUN 35* 35*  --   CREATININE 2.30* 2.12* 1.95*  CALCIUM 9.6 9.2  --     Liver Function Tests: Recent Labs  Lab 12/23/17 0527  AST 47*  ALT 17  ALKPHOS 71  BILITOT 0.9  PROT 6.5  ALBUMIN 3.8   No results for input(s): LIPASE, AMYLASE in the last 168 hours. No results for input(s): AMMONIA in the last 168  hours.  CBC: Recent Labs  Lab 12/22/17 1456 12/23/17 0211  WBC 9.8 7.9  HGB 14.4 13.9  HCT 41.4 40.6  MCV 91.2 92.0  PLT 348 321    Cardiac Enzymes: Recent Labs  Lab 12/22/17 1624 12/22/17 1920 12/22/17 2230 12/23/17 0211 12/23/17 0532  TROPONINI 0.40* 2.91* 7.76* 10.66* 9.10*    BNP: Invalid input(s): POCBNP  CBG: No results for input(s): GLUCAP in the last 168 hours.  Microbiology: No results found for this or any previous visit.  Coagulation Studies: Recent Labs    12/22/17 1624  LABPROT 13.7  INR 1.06    Urinalysis: No results for input(s): COLORURINE, LABSPEC, PHURINE, GLUCOSEU, HGBUR, BILIRUBINUR, KETONESUR, PROTEINUR, UROBILINOGEN, NITRITE, LEUKOCYTESUR in the last 72 hours.  Invalid input(s): APPERANCEUR    Imaging: Dg Chest 2 View  Result Date: 12/22/2017 CLINICAL DATA:  Upper chest pain EXAM: CHEST - 2 VIEW COMPARISON:  None. FINDINGS: Normal mediastinum and cardiac silhouette. Normal pulmonary vasculature. No evidence of effusion, infiltrate, or pneumothorax. No acute bony abnormality. Degenerative osteophytosis of the spine. IMPRESSION: No acute cardiopulmonary process. Electronically Signed   By: Suzy Bouchard M.D.   On: 12/22/2017 15:18     Medications:   . sodium chloride 125 mL/hr at 12/23/17 0920  . sodium chloride    . sodium chloride    .  sodium chloride    . [START ON 12/24/2017] sodium chloride     Followed by  . [START ON 12/24/2017] sodium chloride    . heparin Stopped (12/23/17 1156)   . [MAR Hold] amLODipine  5 mg Oral Daily  . [START ON 12/24/2017] aspirin  81 mg Oral Daily  . [MAR Hold] aspirin EC  325 mg Oral Daily  . [MAR Hold] atorvastatin  80 mg Oral q1800  . [MAR Hold] calcitRIOL  0.25 mcg Oral QHS  . [MAR Hold] docusate sodium  200 mg Oral BID  . heparin  5,000 Units Subcutaneous Q8H  . [MAR Hold] metoprolol succinate  25 mg Oral Daily  . [MAR Hold] multivitamin with minerals   Oral Daily  . sodium chloride  flush  3 mL Intravenous Q12H  . sodium chloride flush  3 mL Intravenous Q12H  . [START ON 12/24/2017] ticagrelor  90 mg Oral BID  . traZODone  100 mg Oral QHS  . [MAR Hold] cyanocobalamin  1,000 mcg Oral Daily   sodium chloride, sodium chloride, [MAR Hold] docusate sodium, hydrALAZINE, labetalol, [MAR Hold] nitroGLYCERIN, sodium chloride flush, sodium chloride flush  Assessment/ Plan:  72 y.o. male with past medical history of hypertension, de Quervain's tenosynovitis, BPH status post prostate biopsy and greenlight surgery  1.  Acute renal failure/chronic kidney disease stage III baseline EGFR 47.  Patient appears to have acute renal failure which is likely related to his myocardial infarction.  He is undergoing cardiac catheterization and will be receiving IV contrast.  As such she is at risk for contrast-induced nephropathy and this was discussed in depth with the patient today.  He has already been started on pre-contrast exposure IV fluids with 0.9 normal saline which will be continued.  Hopefully contrast exposure can be minimized.  Monitor renal parameters daily.  Currently off of losartan.  2.  Hypertension.  Patient to be maintained on amlodipine and metoprolol.  3.  Secondary hyperparathyroidism.  Maintain the patient on Calcitrol 0.25 mcg p.o. daily.  LOS: 1 Lerin Jech 7/10/20193:25 PM

## 2017-12-23 NOTE — Brief Op Note (Signed)
BRIEF CARDIAC CATHETERIZATION NOTE  DATE: 12/23/2017 TIME: 1:38 PM  PATIENT:  Jesus Bolton  72 y.o. male  PRE-OPERATIVE DIAGNOSIS:  NSTEMI  POST-OPERATIVE DIAGNOSIS:  NSTEMI  PROCEDURE:  Procedure(s): LEFT HEART CATH AND CORONARY ANGIOGRAPHY (N/A)  SURGEON:  Surgeon(s) and Role:    * Tabria Steines, Cristal Deer, MD - Primary  FINDINGS: 1. 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 2. Moderate, non-obstructive CAD involving the proximal and mid LAD. 3. Low LVEDP. 4. Successful PCI to mid RCA using Xience Sierra 3.25 x 12 mm drug-eluting stent with 0% residual stenosis and TIMI-3 flow.  RECOMMENDATIONS: 1. DAPT with ASA and ticagrelor for at least 12 months. 2. Aggressive secondary prevention. 3. Gentle post-catheterization hydration.  Yvonne Kendall, MD [facility] Pager: [phone]

## 2017-12-23 NOTE — Consult Note (Signed)
Cardiology Consultation:   Patient ID: Jesus Bolton; [id number]; 12/04/1945   Admit date: 12/22/2017 Date of Consult: 12/23/2017  Primary Care Provider: Dione Housekeeper, MD Primary Cardiologist: New to [facility] - consult by End   Patient Profile:   Jesus Bolton is a 72 y.o. male with a hx of CKD stage II with a baseline SCr of 1.7-1.8 per patient, HTN, BPH, and recent short-term memory loss who is being seen today for the evaluation of elevated troponin at the request of Dr. Elisabeth Pigeon.  History of Present Illness:   Jesus Bolton has no previously known cardiac history. Patient reports a generalized fatigue that dates back to approximately March/April of 2019 when he had a "head cold" that lasted for 1 month. This was followed by some short term memory loss and generalized fatigue. Work up of this has included CT head, MRI brain, and carotid ultrasound; all of which have been unrevealing. Patient was pressure washing his house on 7/9 when he developed upper chest pressure that radiated to his bilateral shoulders and down into his left bicep. Pain was described as a "tightness" that lasted for ~ 45 minutes and improved with rest and taking 4 ASA 81 mg. Pain was rated a 5-6/10. There was no associated SOB, nausea, vomiting, diaphoresis, dizziness, presyncope, or syncope. Patient was brought to the ED by his wife in a private vehicle, with chest pain resolving while en route. Patient indicated over the past several weeks his short term memory has been improving. He was never a smoker, though grew up in a house that his mother smoked in. He has no known DM. His BP has been fairly difficult to control lately.   Upon the patient's arrival to [facility] they were found to have stable vitals with BP trending up into the 150s to 160s systolic. EKG on 7/9, 14:50 - NSR, 84 bpm, left axis deviation, LVH, poor R wave progression, nonspecific inferolateral st elevation not meeting stemi criteria. Repeat EKG 7/9,  15:48 - NSR, 71 bpm, left axis deviation, inferior st elevation without reciprocal changes. Stat EKG this morning continues to be pending. CXR showed no acute cardiopulmonary process. Labs showed troponin 0.24-->0.40-->2.91-->7.76-->10.66-->9.10, SCr 2.30-->2.12 (baseline ~ 1.7-1.8 per patient, followed by nephrology), K+ 4.4-->4.3, glucose 117, CBC unremarkable x 2, LDL 95, A1c pending. Patient had already taken ASA 81 mg x 4 at home prior to arrival in the ED. He was placed on a heparin gtt in the ED, given SL NTG x 1 and continued on home medications. He has remained chest pain free since his arrival to [facility].   Past Medical History:  Diagnosis Date  . BPH (benign prostatic hyperplasia)   . Elevated PSA   . Hypertension   . Indigestion   . Renal disorder   . Thyroid disease     Past Surgical History:  Procedure Laterality Date  . GREEN LIGHT LASER TURP (TRANSURETHRAL RESECTION OF PROSTATE N/A 01/02/2015   Procedure: GREEN LIGHT LASER TURP (TRANSURETHRAL RESECTION OF PROSTATE;  Surgeon: Orson Ape, MD;  Location: [facility] ORS;  Service: Urology;  Laterality: N/A;     Home Meds: Prior to Admission medications   Medication Sig Start Date End Date Taking? Authorizing Provider  amLODipine (NORVASC) 2.5 MG tablet Take 2.5 mg by mouth daily.    Yes [provider]  calcitRIOL (ROCALTROL) 0.25 MCG capsule Take 0.25 mcg by mouth daily. 12/09/17  Yes [provider]  cyanocobalamin 1000 MCG tablet Take 1,000 mcg  by mouth daily.   Yes [provider]  docusate sodium (COLACE) 100 MG capsule Take 2 capsules (200 mg total) by mouth 2 (two) times daily. 01/02/15  Yes Orson Ape, MD  losartan (COZAAR) 25 MG tablet Take 25 mg by mouth at bedtime. 12/09/17  Yes [provider]  metoprolol succinate (TOPROL-XL) 50 MG 24 hr tablet Take 25 mg by mouth daily. 12/09/17  Yes [provider]  Multiple Vitamins-Minerals (CENTRUM SILVER 50+MEN) TABS Take 1 tablet by  mouth daily.   Yes [provider]  traZODone (DESYREL) 50 MG tablet Take 50 mg by mouth at bedtime. 12/03/17  Yes [provider]  guaiFENesin-codeine 100-10 MG/5ML syrup 10 ml po 8 hours prn cough Patient not taking: Reported on 12/22/2017 07/29/15   Payton Mccallum, MD  Meth-Hyo-M Bl-Na Phos-Ph Sal (URIBEL) 118 MG CAPS Take 1 capsule (118 mg total) by mouth every 6 (six) hours as needed. Patient not taking: Reported on 12/22/2017 01/02/15   Orson Ape, MD  NUCYNTA 50 MG TABS tablet Take 1 tablet (50 mg total) by mouth every 6 (six) hours as needed. 1 TO 2 TABS Q 6 HOURS PRN PAIN Patient not taking: Reported on 12/22/2017 01/02/15   Orson Ape, MD  ondansetron (ZOFRAN ODT) 8 MG disintegrating tablet Take 1 tablet (8 mg total) by mouth every 6 (six) hours as needed for nausea or vomiting. Patient not taking: Reported on 12/22/2017 01/02/15   Orson Ape, MD    Inpatient Medications: Scheduled Meds: . amLODipine  5 mg Oral Daily  . aspirin EC  325 mg Oral Daily  . calcitRIOL  0.25 mcg Oral QHS  . docusate sodium  200 mg Oral BID  . metoprolol succinate  25 mg Oral Daily  . multivitamin with minerals   Oral Daily  . traZODone  50 mg Oral QHS  . cyanocobalamin  1,000 mcg Oral Daily   Continuous Infusions: . sodium chloride    . heparin 950 Units/hr (12/22/17 1734)   PRN Meds: docusate sodium, nitroGLYCERIN  Allergies:   Allergies  Allergen Reactions  . Sulfa Antibiotics Rash    Social History:   Social History   Socioeconomic History  . Marital status: Married    Spouse name: Not on file  . Number of children: Not on file  . Years of education: Not on file  . Highest education level: Not on file  Occupational History  . Not on file  Social Needs  . Financial resource strain: Not on file  . Food insecurity:    Worry: Not on file    Inability: Not on file  . Transportation needs:    Medical: Not on file    Non-medical: Not on file  Tobacco Use    . Smoking status: Never Smoker  . Smokeless tobacco: Never Used  Substance and Sexual Activity  . Alcohol use: No  . Drug use: No  . Sexual activity: Not on file  Lifestyle  . Physical activity:    Days per week: Not on file    Minutes per session: Not on file  . Stress: Not on file  Relationships  . Social connections:    Talks on phone: Not on file    Gets together: Not on file    Attends religious service: Not on file    Active member of club or organization: Not on file    Attends meetings of clubs or organizations: Not on file    Relationship status:  Not on file  . Intimate partner violence:    Fear of current or ex partner: Not on file    Emotionally abused: Not on file    Physically abused: Not on file    Forced sexual activity: Not on file  Other Topics Concern  . Not on file  Social History Narrative  . Not on file     Family History:   Family History  Problem Relation Age of Onset  . Cancer Mother        a. cardiac tumor    ROS:  Review of Systems  Constitutional: Positive for malaise/fatigue. Negative for chills, diaphoresis, fever and weight loss.  HENT: Negative for congestion.   Eyes: Negative for discharge and redness.  Respiratory: Negative for cough, hemoptysis, sputum production, shortness of breath and wheezing.   Cardiovascular: Positive for chest pain. Negative for palpitations, orthopnea, claudication, leg swelling and PND.  Gastrointestinal: Negative for abdominal pain, blood in stool, heartburn, melena, nausea and vomiting.  Genitourinary: Negative for hematuria.  Musculoskeletal: Negative for falls and myalgias.  Skin: Negative for rash.  Neurological: Positive for weakness. Negative for dizziness, tingling, tremors, sensory change, speech change, focal weakness and loss of consciousness.  Endo/Heme/Allergies: Does not bruise/bleed easily.  Psychiatric/Behavioral: Negative for substance abuse. The patient is not nervous/anxious.   All  other systems reviewed and are negative.     Physical Exam/Data:   Vitals:   12/22/17 1815 12/22/17 1830 12/22/17 2020 12/23/17 0512  BP: (!) 160/104 (!) 168/87 (!) 167/81 (!) 158/88  Pulse: 68 69 60 62  Resp: 17 19 17 17   Temp:   98.4 F (36.9 C) 97.9 F (36.6 C)  TempSrc:   Oral Oral  SpO2: 100% 98% 100% 100%  Weight:   174 lb 3.2 oz (79 kg) 172 lb 9.6 oz (78.3 kg)  Height:   6' (1.829 m)     Intake/Output Summary (Last 24 hours) at 12/23/2017 0759 Last data filed at 12/23/2017 0514 Gross per 24 hour  Intake 108.62 ml  Output 700 ml  Net -591.38 ml   Filed Weights   12/22/17 1454 12/22/17 2020 12/23/17 0512  Weight: 173 lb (78.5 kg) 174 lb 3.2 oz (79 kg) 172 lb 9.6 oz (78.3 kg)   Body mass index is 23.41 kg/m.   Physical Exam: General: Well developed, well nourished, in no acute distress. Head: Normocephalic, atraumatic, sclera non-icteric, no xanthomas, nares without discharge.  Neck: Negative for carotid bruits. JVD not elevated. Lungs: Clear bilaterally to auscultation without wheezes, rales, or rhonchi. Breathing is unlabored. Heart: RRR with S1 S2. No murmurs, rubs, or gallops appreciated. Abdomen: Soft, non-tender, non-distended with normoactive bowel sounds. No hepatomegaly. No rebound/guarding. No obvious abdominal masses. Msk:  Strength and tone appear normal for age. Extremities: No clubbing or cyanosis. No edema. Distal pedal pulses are 2+ and equal bilaterally. Neuro: Alert and oriented X 3. No facial asymmetry. No focal deficit. Moves all extremities spontaneously. Psych:  Responds to questions appropriately with a normal affect.   EKG:  The EKG was personally reviewed and demonstrates: 7/9, 14:50 - NSR, 84 bpm, left axis deviation, LVH, poor R wave progression, nonspecific inferolateral st elevation not meeting stemi criteria. Repeat EKG 7/9, 15:48 - NSR, 71 bpm, left axis deviation, inferior st elevation without reciprocal changes. Stat EKG this morning  pending.  Telemetry:  Telemetry was personally reviewed and demonstrates: NSR, occasional PVCs  Weights: Filed Weights   12/22/17 1454 12/22/17 2020 12/23/17 0512  Weight: 173 lb (  78.5 kg) 174 lb 3.2 oz (79 kg) 172 lb 9.6 oz (78.3 kg)    Relevant CV Studies: none  Laboratory Data:  Chemistry Recent Labs  Lab 12/22/17 1456 12/23/17 0211  NA 137 141  K 4.4 4.3  CL 105 109  CO2 25 24  GLUCOSE 117* 82  BUN 35* 35*  CREATININE 2.30* 2.12*  CALCIUM 9.6 9.2  GFRNONAA 27* 29*  GFRAA 31* 34*  ANIONGAP 7 8    No results for input(s): PROT, ALBUMIN, AST, ALT, ALKPHOS, BILITOT in the last 168 hours. Hematology Recent Labs  Lab 12/22/17 1456 12/23/17 0211  WBC 9.8 7.9  RBC 4.54 4.41  HGB 14.4 13.9  HCT 41.4 40.6  MCV 91.2 92.0  MCH 31.7 31.5  MCHC 34.7 34.3  RDW 13.3 13.4  PLT 348 321   Cardiac Enzymes Recent Labs  Lab 12/22/17 1456 12/22/17 1624 12/22/17 1920 12/22/17 2230 12/23/17 0211 12/23/17 0532  TROPONINI 0.24* 0.40* 2.91* 7.76* 10.66* 9.10*   No results for input(s): TROPIPOC in the last 168 hours.  BNPNo results for input(s): BNP, PROBNP in the last 168 hours.  DDimer No results for input(s): DDIMER in the last 168 hours.  Radiology/Studies:  Dg Chest 2 View  Result Date: 12/22/2017 IMPRESSION: No acute cardiopulmonary process. Electronically Signed   By: Genevive Bi M.D.   On: 12/22/2017 15:18    Assessment and Plan:   1. NSTEMI: -Currently chest pain free -Troponin has peaked at 10.66, now down trending -ASA -Heparin gtt -Echo pending -Needs a LHC, though his renal function is elevated from his baseline of ~ 1.7-1.8 (per his report). He is not currently in decompensated heart failure. Will give a 250 mL normal saline bolus followed by IV fluid hydration this morning at 125 mL/hr in preparation of LHC to be performed later this afternoon or in the morning of 7/11 -Plan for LHC later this morning, discussed with Dr. Okey Dupre -NPO  currently -Stat repeat EKG is pending at this time -Will need to limit IV contrast and possibly even perform a staged procedure -Will need IV fluids following the case in an effort to decrease contrast-induced nephropathy  -Risks and benefits of cardiac catheterization have been discussed with the patient including risks of bleeding, bruising, infection, kidney damage, stroke, heart attack, and death. The patient understands these risks and is willing to proceed with the procedure. All questions have been answered and concerns listened to  2. Acute on CKD stage II: -Nephrology has been consulted -IV hydration as above -Hold losartan this morning  3. HTN: -BP remained elevated this morning -Losartan held given his acute on CKD as above -Increase amlodipine to 5 mg daily -Continue Toprol XL 25 mg daily  4. HLD: -Start Lipitor 80 mg daily -Check LFT    For questions or updates, please contact [facility] Please consult [email or web address] for contact info under Cardiology/STEMI.   Signed, Eula Listen, PA-C [facility] Pager: [phone] 12/23/2017, 7:59 AM

## 2017-12-23 NOTE — Progress Notes (Signed)
MD Made aware of new troponin level of 10.66, no c/o chest pain, will continue to monitor.

## 2017-12-23 NOTE — Plan of Care (Signed)
  Problem: Health Behavior/Discharge Planning: Goal: Ability to manage health-related needs will improve Outcome: Progressing Note:  Patient agreeable to a L.H.C. this afternoon w/ Dr. Okey Dupre. In procedure area now. Will monitor for final findings. Jari Favre [facility]

## 2017-12-23 NOTE — Interval H&P Note (Signed)
History and Physical Interval Note:  12/23/2017 12:04 PM  Jesus Bolton  has presented today for cardiac catheterization, with the diagnosis of NSTEMI  The various methods of treatment have been discussed with the patient and family. After consideration of risks, benefits and other options for treatment, the patient has consented to  Procedure(s): LEFT HEART CATH AND CORONARY ANGIOGRAPHY (N/A) as a surgical intervention .  The patient's history has been reviewed, patient examined, no change in status, stable for surgery.  I have reviewed the patient's chart and labs.  Questions were answered to the patient's satisfaction.    Cath Lab Visit (complete for each Cath Lab visit)  Clinical Evaluation Leading to the Procedure:   ACS: Yes.    Non-ACS:  N/A  [name]

## 2017-12-23 NOTE — H&P (View-Only) (Signed)
   Cardiology Consultation:   Patient ID: Jesus Bolton; [id number]; 12/20/1945   Admit date: 12/22/2017 Date of Consult: 12/23/2017  Primary Care Provider: Olmedo, Mario Ernesto, MD Primary Cardiologist: New to [facility] - consult by End   Patient Profile:   Jesus Bolton is a 72 y.o. male with a hx of CKD stage II with a baseline SCr of 1.7-1.8 per patient, HTN, BPH, and recent short-term memory loss who is being seen today for the evaluation of elevated troponin at the request of Dr. Vachhani.  History of Present Illness:   Mr. Kober has no previously known cardiac history. Patient reports a generalized fatigue that dates back to approximately March/April of 2019 when he had a "head cold" that lasted for 1 month. This was followed by some short term memory loss and generalized fatigue. Work up of this has included CT head, MRI brain, and carotid ultrasound; all of which have been unrevealing. Patient was pressure washing his house on 7/9 when he developed upper chest pressure that radiated to his bilateral shoulders and down into his left bicep. Pain was described as a "tightness" that lasted for ~ 45 minutes and improved with rest and taking 4 ASA 81 mg. Pain was rated a 5-6/10. There was no associated SOB, nausea, vomiting, diaphoresis, dizziness, presyncope, or syncope. Patient was brought to the ED by his wife in a private vehicle, with chest pain resolving while en route. Patient indicated over the past several weeks his short term memory has been improving. He was never a smoker, though grew up in a house that his mother smoked in. He has no known DM. His BP has been fairly difficult to control lately.   Upon the patient's arrival to [facility] they were found to have stable vitals with BP trending up into the 150s to 160s systolic. EKG on 7/9, 14:50 - NSR, 84 bpm, left axis deviation, LVH, poor R wave progression, nonspecific inferolateral st elevation not meeting stemi criteria. Repeat EKG 7/9,  15:48 - NSR, 71 bpm, left axis deviation, inferior st elevation without reciprocal changes. Stat EKG this morning continues to be pending. CXR showed no acute cardiopulmonary process. Labs showed troponin 0.24-->0.40-->2.91-->7.76-->10.66-->9.10, SCr 2.30-->2.12 (baseline ~ 1.7-1.8 per patient, followed by nephrology), K+ 4.4-->4.3, glucose 117, CBC unremarkable x 2, LDL 95, A1c pending. Patient had already taken ASA 81 mg x 4 at home prior to arrival in the ED. He was placed on a heparin gtt in the ED, given SL NTG x 1 and continued on home medications. He has remained chest pain free since his arrival to [facility].   Past Medical History:  Diagnosis Date  . BPH (benign prostatic hyperplasia)   . Elevated PSA   . Hypertension   . Indigestion   . Renal disorder   . Thyroid disease     Past Surgical History:  Procedure Laterality Date  . GREEN LIGHT LASER TURP (TRANSURETHRAL RESECTION OF PROSTATE N/A 01/02/2015   Procedure: GREEN LIGHT LASER TURP (TRANSURETHRAL RESECTION OF PROSTATE;  Surgeon: Michael R Wolff, MD;  Location: [facility] ORS;  Service: Urology;  Laterality: N/A;     Home Meds: Prior to Admission medications   Medication Sig Start Date End Date Taking? Authorizing Provider  amLODipine (NORVASC) 2.5 MG tablet Take 2.5 mg by mouth daily.    Yes [provider]  calcitRIOL (ROCALTROL) 0.25 MCG capsule Take 0.25 mcg by mouth daily. 12/09/17  Yes [provider]  cyanocobalamin 1000 MCG tablet Take 1,000 mcg   by mouth daily.   Yes [provider]  docusate sodium (COLACE) 100 MG capsule Take 2 capsules (200 mg total) by mouth 2 (two) times daily. 01/02/15  Yes Wolff, Michael R, MD  losartan (COZAAR) 25 MG tablet Take 25 mg by mouth at bedtime. 12/09/17  Yes [provider]  metoprolol succinate (TOPROL-XL) 50 MG 24 hr tablet Take 25 mg by mouth daily. 12/09/17  Yes [provider]  Multiple Vitamins-Minerals (CENTRUM SILVER 50+MEN) TABS Take 1 tablet by  mouth daily.   Yes [provider]  traZODone (DESYREL) 50 MG tablet Take 50 mg by mouth at bedtime. 12/03/17  Yes [provider]  guaiFENesin-codeine 100-10 MG/5ML syrup 10 ml po 8 hours prn cough Patient not taking: Reported on 12/22/2017 07/29/15   Conty, Orlando, MD  Meth-Hyo-M Bl-Na Phos-Ph Sal (URIBEL) 118 MG CAPS Take 1 capsule (118 mg total) by mouth every 6 (six) hours as needed. Patient not taking: Reported on 12/22/2017 01/02/15   Wolff, Michael R, MD  NUCYNTA 50 MG TABS tablet Take 1 tablet (50 mg total) by mouth every 6 (six) hours as needed. 1 TO 2 TABS Q 6 HOURS PRN PAIN Patient not taking: Reported on 12/22/2017 01/02/15   Wolff, Michael R, MD  ondansetron (ZOFRAN ODT) 8 MG disintegrating tablet Take 1 tablet (8 mg total) by mouth every 6 (six) hours as needed for nausea or vomiting. Patient not taking: Reported on 12/22/2017 01/02/15   Wolff, Michael R, MD    Inpatient Medications: Scheduled Meds: . amLODipine  5 mg Oral Daily  . aspirin EC  325 mg Oral Daily  . calcitRIOL  0.25 mcg Oral QHS  . docusate sodium  200 mg Oral BID  . metoprolol succinate  25 mg Oral Daily  . multivitamin with minerals   Oral Daily  . traZODone  50 mg Oral QHS  . cyanocobalamin  1,000 mcg Oral Daily   Continuous Infusions: . sodium chloride    . heparin 950 Units/hr (12/22/17 1734)   PRN Meds: docusate sodium, nitroGLYCERIN  Allergies:   Allergies  Allergen Reactions  . Sulfa Antibiotics Rash    Social History:   Social History   Socioeconomic History  . Marital status: Married    Spouse name: Not on file  . Number of children: Not on file  . Years of education: Not on file  . Highest education level: Not on file  Occupational History  . Not on file  Social Needs  . Financial resource strain: Not on file  . Food insecurity:    Worry: Not on file    Inability: Not on file  . Transportation needs:    Medical: Not on file    Non-medical: Not on file  Tobacco Use    . Smoking status: Never Smoker  . Smokeless tobacco: Never Used  Substance and Sexual Activity  . Alcohol use: No  . Drug use: No  . Sexual activity: Not on file  Lifestyle  . Physical activity:    Days per week: Not on file    Minutes per session: Not on file  . Stress: Not on file  Relationships  . Social connections:    Talks on phone: Not on file    Gets together: Not on file    Attends religious service: Not on file    Active member of club or organization: Not on file    Attends meetings of clubs or organizations: Not on file    Relationship status:   Not on file  . Intimate partner violence:    Fear of current or ex partner: Not on file    Emotionally abused: Not on file    Physically abused: Not on file    Forced sexual activity: Not on file  Other Topics Concern  . Not on file  Social History Narrative  . Not on file     Family History:   Family History  Problem Relation Age of Onset  . Cancer Mother        a. cardiac tumor    ROS:  Review of Systems  Constitutional: Positive for malaise/fatigue. Negative for chills, diaphoresis, fever and weight loss.  HENT: Negative for congestion.   Eyes: Negative for discharge and redness.  Respiratory: Negative for cough, hemoptysis, sputum production, shortness of breath and wheezing.   Cardiovascular: Positive for chest pain. Negative for palpitations, orthopnea, claudication, leg swelling and PND.  Gastrointestinal: Negative for abdominal pain, blood in stool, heartburn, melena, nausea and vomiting.  Genitourinary: Negative for hematuria.  Musculoskeletal: Negative for falls and myalgias.  Skin: Negative for rash.  Neurological: Positive for weakness. Negative for dizziness, tingling, tremors, sensory change, speech change, focal weakness and loss of consciousness.  Endo/Heme/Allergies: Does not bruise/bleed easily.  Psychiatric/Behavioral: Negative for substance abuse. The patient is not nervous/anxious.   All  other systems reviewed and are negative.     Physical Exam/Data:   Vitals:   12/22/17 1815 12/22/17 1830 12/22/17 2020 12/23/17 0512  BP: (!) 160/104 (!) 168/87 (!) 167/81 (!) 158/88  Pulse: 68 69 60 62  Resp: 17 19 17 17  Temp:   98.4 F (36.9 C) 97.9 F (36.6 C)  TempSrc:   Oral Oral  SpO2: 100% 98% 100% 100%  Weight:   174 lb 3.2 oz (79 kg) 172 lb 9.6 oz (78.3 kg)  Height:   6' (1.829 m)     Intake/Output Summary (Last 24 hours) at 12/23/2017 0759 Last data filed at 12/23/2017 0514 Gross per 24 hour  Intake 108.62 ml  Output 700 ml  Net -591.38 ml   Filed Weights   12/22/17 1454 12/22/17 2020 12/23/17 0512  Weight: 173 lb (78.5 kg) 174 lb 3.2 oz (79 kg) 172 lb 9.6 oz (78.3 kg)   Body mass index is 23.41 kg/m.   Physical Exam: General: Well developed, well nourished, in no acute distress. Head: Normocephalic, atraumatic, sclera non-icteric, no xanthomas, nares without discharge.  Neck: Negative for carotid bruits. JVD not elevated. Lungs: Clear bilaterally to auscultation without wheezes, rales, or rhonchi. Breathing is unlabored. Heart: RRR with S1 S2. No murmurs, rubs, or gallops appreciated. Abdomen: Soft, non-tender, non-distended with normoactive bowel sounds. No hepatomegaly. No rebound/guarding. No obvious abdominal masses. Msk:  Strength and tone appear normal for age. Extremities: No clubbing or cyanosis. No edema. Distal pedal pulses are 2+ and equal bilaterally. Neuro: Alert and oriented X 3. No facial asymmetry. No focal deficit. Moves all extremities spontaneously. Psych:  Responds to questions appropriately with a normal affect.   EKG:  The EKG was personally reviewed and demonstrates: 7/9, 14:50 - NSR, 84 bpm, left axis deviation, LVH, poor R wave progression, nonspecific inferolateral st elevation not meeting stemi criteria. Repeat EKG 7/9, 15:48 - NSR, 71 bpm, left axis deviation, inferior st elevation without reciprocal changes. Stat EKG this morning  pending.  Telemetry:  Telemetry was personally reviewed and demonstrates: NSR, occasional PVCs  Weights: Filed Weights   12/22/17 1454 12/22/17 2020 12/23/17 0512  Weight: 173 lb (  78.5 kg) 174 lb 3.2 oz (79 kg) 172 lb 9.6 oz (78.3 kg)    Relevant CV Studies: none  Laboratory Data:  Chemistry Recent Labs  Lab 12/22/17 1456 12/23/17 0211  NA 137 141  K 4.4 4.3  CL 105 109  CO2 25 24  GLUCOSE 117* 82  BUN 35* 35*  CREATININE 2.30* 2.12*  CALCIUM 9.6 9.2  GFRNONAA 27* 29*  GFRAA 31* 34*  ANIONGAP 7 8    No results for input(s): PROT, ALBUMIN, AST, ALT, ALKPHOS, BILITOT in the last 168 hours. Hematology Recent Labs  Lab 12/22/17 1456 12/23/17 0211  WBC 9.8 7.9  RBC 4.54 4.41  HGB 14.4 13.9  HCT 41.4 40.6  MCV 91.2 92.0  MCH 31.7 31.5  MCHC 34.7 34.3  RDW 13.3 13.4  PLT 348 321   Cardiac Enzymes Recent Labs  Lab 12/22/17 1456 12/22/17 1624 12/22/17 1920 12/22/17 2230 12/23/17 0211 12/23/17 0532  TROPONINI 0.24* 0.40* 2.91* 7.76* 10.66* 9.10*   No results for input(s): TROPIPOC in the last 168 hours.  BNPNo results for input(s): BNP, PROBNP in the last 168 hours.  DDimer No results for input(s): DDIMER in the last 168 hours.  Radiology/Studies:  Dg Chest 2 View  Result Date: 12/22/2017 IMPRESSION: No acute cardiopulmonary process. Electronically Signed   By: Stewart  Edmunds M.D.   On: 12/22/2017 15:18    Assessment and Plan:   1. NSTEMI: -Currently chest pain free -Troponin has peaked at 10.66, now down trending -ASA -Heparin gtt -Echo pending -Needs a LHC, though his renal function is elevated from his baseline of ~ 1.7-1.8 (per his report). He is not currently in decompensated heart failure. Will give a 250 mL normal saline bolus followed by IV fluid hydration this morning at 125 mL/hr in preparation of LHC to be performed later this afternoon or in the morning of 7/11 -Plan for LHC later this morning, discussed with Dr. End -NPO  currently -Stat repeat EKG is pending at this time -Will need to limit IV contrast and possibly even perform a staged procedure -Will need IV fluids following the case in an effort to decrease contrast-induced nephropathy  -Risks and benefits of cardiac catheterization have been discussed with the patient including risks of bleeding, bruising, infection, kidney damage, stroke, heart attack, and death. The patient understands these risks and is willing to proceed with the procedure. All questions have been answered and concerns listened to  2. Acute on CKD stage II: -Nephrology has been consulted -IV hydration as above -Hold losartan this morning  3. HTN: -BP remained elevated this morning -Losartan held given his acute on CKD as above -Increase amlodipine to 5 mg daily -Continue Toprol XL 25 mg daily  4. HLD: -Start Lipitor 80 mg daily -Check LFT    For questions or updates, please contact [facility] Please consult [email or web address] for contact info under Cardiology/STEMI.   Signed, Nitzia Perren, PA-C [facility] Pager: [phone] 12/23/2017, 7:59 AM    

## 2017-12-23 NOTE — Progress Notes (Signed)
Patient remains clinically stable post heart cath, family at bedside. Vitals stable. After first 29ml air removed from TR band, began to bleed at site, thus at 1510 began and held pressure at site for 56min's, no further bleeding, will hold off on removing air until 1600 after paging and consulting with DR End. Vitals stable during this time.

## 2017-12-23 NOTE — Progress Notes (Signed)
ANTICOAGULATION CONSULT NOTE - Follow Up Consult  Pharmacy Consult for Heparin  Indication: chest pain/ACS  Allergies  Allergen Reactions  . Sulfa Antibiotics Rash    Patient Measurements: Height: 6' (182.9 cm) Weight: 174 lb 3.2 oz (79 kg) IBW/kg (Calculated) : 77.6 Heparin Dosing Weight:   Vital Signs: Temp: 98.4 F (36.9 C) (07/09 2020) Temp Source: Oral (07/09 2020) BP: 167/81 (07/09 2020) Pulse Rate: 60 (07/09 2020)  Labs: Recent Labs    12/22/17 1456 12/22/17 1624 12/22/17 1920 12/22/17 2230 12/23/17 0211  HGB 14.4  --   --   --  13.9  HCT 41.4  --   --   --  40.6  PLT 348  --   --   --  321  APTT  --  29  --   --   --   LABPROT  --  13.7  --   --   --   INR  --  1.06  --   --   --   HEPARINUNFRC  --   --   --   --  0.32  CREATININE 2.30*  --   --   --  2.12*  TROPONINI 0.24* 0.40* 2.91* 7.76* 10.66*    Estimated Creatinine Clearance: 34.6 mL/min (A) (by C-G formula based on SCr of 2.12 mg/dL (H)).   Medications:  Medications Prior to Admission  Medication Sig Dispense Refill Last Dose  . amLODipine (NORVASC) 2.5 MG tablet Take 2.5 mg by mouth daily.    12/22/2017 at 1000  . calcitRIOL (ROCALTROL) 0.25 MCG capsule Take 0.25 mcg by mouth daily.   12/21/2017 at 2100  . cyanocobalamin 1000 MCG tablet Take 1,000 mcg by mouth daily.   12/22/2017 at 1000  . docusate sodium (COLACE) 100 MG capsule Take 2 capsules (200 mg total) by mouth 2 (two) times daily. 120 capsule 3 PRN at PRN  . losartan (COZAAR) 25 MG tablet Take 25 mg by mouth at bedtime.   12/21/2017 at 2100  . metoprolol succinate (TOPROL-XL) 50 MG 24 hr tablet Take 25 mg by mouth daily.   12/22/2017 at 1000  . Multiple Vitamins-Minerals (CENTRUM SILVER 50+MEN) TABS Take 1 tablet by mouth daily.   12/21/2017 at 1000  . traZODone (DESYREL) 50 MG tablet Take 50 mg by mouth at bedtime.   12/21/2017 at 2100  . guaiFENesin-codeine 100-10 MG/5ML syrup 10 ml po 8 hours prn cough (Patient not taking: Reported on 12/22/2017)  120 mL 0 Not Taking at Unknown time  . Meth-Hyo-M Bl-Na Phos-Ph Sal (URIBEL) 118 MG CAPS Take 1 capsule (118 mg total) by mouth every 6 (six) hours as needed. (Patient not taking: Reported on 12/22/2017) 40 capsule 3 Not Taking at Unknown time  . NUCYNTA 50 MG TABS tablet Take 1 tablet (50 mg total) by mouth every 6 (six) hours as needed. 1 TO 2 TABS Q 6 HOURS PRN PAIN (Patient not taking: Reported on 12/22/2017) 30 tablet 0 Not Taking at Unknown time  . ondansetron (ZOFRAN ODT) 8 MG disintegrating tablet Take 1 tablet (8 mg total) by mouth every 6 (six) hours as needed for nausea or vomiting. (Patient not taking: Reported on 12/22/2017) 10 tablet 3 Not Taking at Unknown time    Assessment: Pharmacy consulted to dose heparin in this 72 year old male with ACS/NSTEMI . CrCl = 34.6 ml/min  Goal of Therapy:  Heparin level 0.3-0.7 units/ml Monitor platelets by anticoagulation protocol: Yes   Plan:  Baseline labs ordered Give 4000 units  bolus x 1 Start heparin infusion at 950 units/hr Check anti-Xa level in 8 hours and daily while on heparin Continue to monitor H&H and platelets  7/10:  HL @ 0211 = 0.32 Will continue pt on current rate and recheck HL in 8 hrs .     Alegandro Macnaughton D 12/23/2017,3:35 AM

## 2017-12-23 NOTE — Progress Notes (Signed)
Patient remains stable although having difficulty removing air from TR band,secondarily to oozing, consulted with DR End, have waited to restart releasing air again, Dr End here to see patient, no bleeding visible at this time. Denies complaints.; vitals have been stable. j

## 2017-12-23 NOTE — Progress Notes (Signed)
Patient ID: Jesus Bolton, male   DOB: 1946-03-02, 72 y.o.   MRN: [id number]  [employer] PROGRESS NOTE  Jesus Bolton DOB: 1945/10/16 DOA: 12/22/2017 PCP: Dione Housekeeper, MD  HPI/Subjective: Patient was seen this morning prior to cardiac catheterization.  He was feeling better.  Had no complaints of chest pain or shortness of breath.  He has been having weakness and tiredness and difficulty sleeping and forgetfulness for good period of time.  Objective: Vitals:   12/23/17 1228 12/23/17 1355  BP:  (!) 131/96  Pulse:  68  Resp:  15  Temp:    SpO2: 96% 100%    Filed Weights   12/22/17 2020 12/23/17 0512 12/23/17 1157  Weight: 79 kg (174 lb 3.2 oz) 78.3 kg (172 lb 9.6 oz) 78 kg (172 lb)    ROS: Review of Systems  Constitutional: Positive for malaise/fatigue. Negative for chills and fever.  Eyes: Negative for blurred vision.  Respiratory: Negative for cough and shortness of breath.   Cardiovascular: Negative for chest pain.  Gastrointestinal: Negative for abdominal pain, constipation, diarrhea, nausea and vomiting.  Genitourinary: Negative for dysuria.  Musculoskeletal: Negative for joint pain.  Neurological: Negative for dizziness and headaches.   Exam: Physical Exam  Constitutional: He is oriented to person, place, and time.  HENT:  Nose: No mucosal edema.  Mouth/Throat: No oropharyngeal exudate or posterior oropharyngeal edema.  Eyes: Pupils are equal, round, and reactive to light. Conjunctivae, EOM and lids are normal.  Neck: No JVD present. Carotid bruit is not present. No edema present. No thyroid mass and no thyromegaly present.  Cardiovascular: S1 normal and S2 normal. Exam reveals no gallop.  No murmur heard. Pulses:      Dorsalis pedis pulses are 2+ on the right side, and 2+ on the left side.  Respiratory: No respiratory distress. He has no wheezes. He has no rhonchi. He has no rales.  GI: Soft. Bowel sounds are normal. There is no  tenderness.  Musculoskeletal:       Right ankle: He exhibits no swelling.       Left ankle: He exhibits no swelling.  Lymphadenopathy:    He has no cervical adenopathy.  Neurological: He is alert and oriented to person, place, and time. No cranial nerve deficit.  Skin: Skin is warm. No rash noted. Nails show no clubbing.  Psychiatric: He has a normal mood and affect.      Data Reviewed: Basic Metabolic Panel: Recent Labs  Lab 12/22/17 1456 12/23/17 0211 12/23/17 0527  NA 137 141  --   K 4.4 4.3  --   CL 105 109  --   CO2 25 24  --   GLUCOSE 117* 82  --   BUN 35* 35*  --   CREATININE 2.30* 2.12* 1.95*  CALCIUM 9.6 9.2  --    Liver Function Tests: Recent Labs  Lab 12/23/17 0527  AST 47*  ALT 17  ALKPHOS 71  BILITOT 0.9  PROT 6.5  ALBUMIN 3.8   CBC: Recent Labs  Lab 12/22/17 1456 12/23/17 0211  WBC 9.8 7.9  HGB 14.4 13.9  HCT 41.4 40.6  MCV 91.2 92.0  PLT 348 321   Cardiac Enzymes: Recent Labs  Lab 12/22/17 1624 12/22/17 1920 12/22/17 2230 12/23/17 0211 12/23/17 0532  TROPONINI 0.40* 2.91* 7.76* 10.66* 9.10*     Studies: Dg Chest 2 View  Result Date: 12/22/2017 CLINICAL DATA:  Upper chest pain EXAM: CHEST - 2 VIEW COMPARISON:  None.  FINDINGS: Normal mediastinum and cardiac silhouette. Normal pulmonary vasculature. No evidence of effusion, infiltrate, or pneumothorax. No acute bony abnormality. Degenerative osteophytosis of the spine. IMPRESSION: No acute cardiopulmonary process. Electronically Signed   By: Genevive Bi M.D.   On: 12/22/2017 15:18    Scheduled Meds: . [MAR Hold] amLODipine  5 mg Oral Daily  . [START ON 12/24/2017] aspirin  81 mg Oral Daily  . [MAR Hold] aspirin EC  325 mg Oral Daily  . [MAR Hold] atorvastatin  80 mg Oral q1800  . [MAR Hold] calcitRIOL  0.25 mcg Oral QHS  . [MAR Hold] docusate sodium  200 mg Oral BID  . heparin  5,000 Units Subcutaneous Q8H  . [MAR Hold] metoprolol succinate  25 mg Oral Daily  . [MAR Hold]  multivitamin with minerals   Oral Daily  . sodium chloride flush  3 mL Intravenous Q12H  . sodium chloride flush  3 mL Intravenous Q12H  . [START ON 12/24/2017] ticagrelor  90 mg Oral BID  . [MAR Hold] traZODone  50 mg Oral QHS  . [MAR Hold] cyanocobalamin  1,000 mcg Oral Daily   Continuous Infusions: . sodium chloride 125 mL/hr at 12/23/17 0920  . sodium chloride    . sodium chloride    . sodium chloride    . [START ON 12/24/2017] sodium chloride     Followed by  . [START ON 12/24/2017] sodium chloride    . heparin Stopped (12/23/17 1156)    Assessment/Plan:  1. NSTEMI.  Cardiac catheterization shows single-vessel coronary artery disease status post stent placement.  Patient on aspirin and metoprolol.  Started on Brilinta and atorvastatin.  2. Acute kidney injury on chronic kidney disease stage III.  Gentle IV fluid hydration.  Monitor creatinine after cardiac catheterization. 3. Insomnia and memory loss.  Increase trazodone 100 mg nightly.  I have seen some people on Norvasc with memory loss can consider discontinuing this medication. 4. Essential hypertension continue usual medications for now. 5. Hyperlipidemia unspecified.  LDL 95 goal less than 70 with an STEMI.  On Lipitor.  Code Status:     Code Status Orders  (From admission, onward)        Start     Ordered   12/23/17 0906  Full code  Continuous     12/23/17 0906    Code Status History    Date Active Date Inactive Code Status Order ID Comments User Context   12/22/2017 2013 12/23/2017 1610 DNR [id number]  Altamese Dilling, MD Inpatient    Advance Directive Documentation     Most Recent Value  Type of Advance Directive  Living will, Healthcare Power of Attorney  Pre-existing out of facility DNR order (yellow form or pink MOST form)  -  "MOST" Form in Place?  -     Family Communication: Spoke with wife this morning. Disposition Plan: Potential discharge home tomorrow if doing  well.  Consultants:  Cardiology  Nephrology  Time spent:  28 minutes  Twanna Resh [employer]

## 2017-12-24 ENCOUNTER — Encounter: Payer: Self-pay | Admitting: Internal Medicine

## 2017-12-24 ENCOUNTER — Telehealth: Payer: Self-pay | Admitting: Internal Medicine

## 2017-12-24 DIAGNOSIS — Z79899 Other long term (current) drug therapy: Secondary | ICD-10-CM

## 2017-12-24 DIAGNOSIS — I214 Non-ST elevation (NSTEMI) myocardial infarction: Secondary | ICD-10-CM

## 2017-12-24 LAB — BASIC METABOLIC PANEL
ANION GAP: 8 (ref 5–15)
BUN: 26 mg/dL — ABNORMAL HIGH (ref 8–23)
CALCIUM: 9.2 mg/dL (ref 8.9–10.3)
CO2: 20 mmol/L — AB (ref 22–32)
CREATININE: 1.58 mg/dL — AB (ref 0.61–1.24)
Chloride: 112 mmol/L — ABNORMAL HIGH (ref 98–111)
GFR, EST AFRICAN AMERICAN: 49 mL/min — AB (ref >60)
GFR, EST NON AFRICAN AMERICAN: 42 mL/min — AB (ref >60)
Glucose, Bld: 88 mg/dL (ref 70–99)
Potassium: 4.2 mmol/L (ref 3.5–5.1)
SODIUM: 140 mmol/L (ref 135–145)

## 2017-12-24 LAB — CBC
HCT: 39.4 % — ABNORMAL LOW (ref 40.0–52.0)
HEMOGLOBIN: 13.5 g/dL (ref 13.0–18.0)
MCH: 31.6 pg (ref 26.0–34.0)
MCHC: 34.4 g/dL (ref 32.0–36.0)
MCV: 92 fL (ref 80.0–100.0)
PLATELETS: 302 10*3/uL (ref 150–440)
RBC: 4.28 MIL/uL — AB (ref 4.40–5.90)
RDW: 13.2 % (ref 11.5–14.5)
WBC: 7.1 10*3/uL (ref 3.8–10.6)

## 2017-12-24 LAB — HEMOGLOBIN A1C
Hgb A1c MFr Bld: 5.2 % (ref 4.8–5.6)
Mean Plasma Glucose: 103 mg/dL

## 2017-12-24 MED ORDER — LOSARTAN POTASSIUM 25 MG PO TABS
25.0000 mg | ORAL_TABLET | Freq: Every day | ORAL | Status: DC
  Administered 2017-12-24: 25 mg via ORAL
  Filled 2017-12-24: qty 1

## 2017-12-24 MED ORDER — METOPROLOL SUCCINATE ER 50 MG PO TB24: 50.0000 mg | ORAL_TABLET | Freq: Every day | ORAL | 0 refills | Status: DC

## 2017-12-24 MED ORDER — TICAGRELOR 90 MG PO TABS: 90.0000 mg | ORAL_TABLET | Freq: Two times a day (BID) | ORAL | 0 refills | Status: DC

## 2017-12-24 MED ORDER — NITROGLYCERIN 0.4 MG SL SUBL: 0.4000 mg | SUBLINGUAL_TABLET | SUBLINGUAL | 0 refills | Status: DC | PRN

## 2017-12-24 MED ORDER — ATORVASTATIN CALCIUM 80 MG PO TABS: 80.0000 mg | ORAL_TABLET | Freq: Every day | ORAL | 0 refills | Status: DC

## 2017-12-24 MED ORDER — LOSARTAN POTASSIUM 25 MG PO TABS: 12.5000 mg | ORAL_TABLET | Freq: Every day | ORAL | 0 refills | Status: DC

## 2017-12-24 MED ORDER — METOPROLOL SUCCINATE ER 50 MG PO TB24
50.0000 mg | ORAL_TABLET | Freq: Every day | ORAL | Status: DC
  Administered 2017-12-24: 50 mg via ORAL
  Filled 2017-12-24: qty 1

## 2017-12-24 MED ORDER — LOSARTAN POTASSIUM 25 MG PO TABS: 12.5000 mg | ORAL_TABLET | Freq: Every day | ORAL | Status: DC

## 2017-12-24 MED ORDER — ASPIRIN 81 MG PO CHEW: 81.0000 mg | CHEWABLE_TABLET | Freq: Every day | ORAL | 0 refills | Status: AC

## 2017-12-24 NOTE — Progress Notes (Signed)
Nutrition Brief Note  Patient identified on the Malnutrition Screening Tool (MST) Report  Wt Readings from Last 15 Encounters:  12/24/17 173 lb (78.5 kg)  07/29/15 190 lb (86.2 kg)  01/02/15 194 lb (88 kg)    Body mass index is 23.46 kg/m. Patient meets criteria for normal weight based on current BMI.   Current diet order is HH, patient is consuming approximately 100% of meals at this time. Labs and medications reviewed. Per chart pt weighed 179lbs in April, 179lbs in May and appears to be weight stable.   No nutrition interventions warranted at this time. If nutrition issues arise, please consult RD.   Pt to discharge today  Betsey Holiday MS, RD, LDN Pager #[phone] Office#- [phone] After Hours Pager: [phone]

## 2017-12-24 NOTE — Discharge Summary (Signed)
[employer] [facility] -  at [facility]   PATIENT NAME: Jesus Bolton    MR#:  [id number]  DATE OF BIRTH:  07/21/1945  DATE OF ADMISSION:  12/22/2017 ADMITTING PHYSICIAN: Altamese Dilling, MD  DATE OF DISCHARGE: 12/24/2017 11:40 AM  PRIMARY CARE PHYSICIAN: Dione Housekeeper, MD    ADMISSION DIAGNOSIS:  NSTEMI (non-ST elevated myocardial infarction) (HCC) [I21.4]  DISCHARGE DIAGNOSIS:  Principal Problem:   NSTEMI (non-ST elevated myocardial infarction) (HCC)   SECONDARY DIAGNOSIS:   Past Medical History:  Diagnosis Date  . BPH (benign prostatic hyperplasia)   . Elevated PSA   . Hypertension   . Indigestion   . Renal disorder   . Thyroid disease     HOSPITAL COURSE:   1.  NSTEMI.  Cardiac catheterization showed single-vessel coronary artery disease.  Patient had a stent placed the day prior of discharge.  Patient prescribed aspirin, Brilinta, atorvastatin.  His metoprolol dose was increased.  Cardiac rehab ordered.   Troponin peaked at 10.66. 2.  Acute kidney injury on chronic kidney disease stage III.  The patient was given IV fluid hydration.  Patient's creatinine was 2.  3 0 on presentation and 1.58 upon going home.  Baseline creatinine as per nephrologist around 1.4.  Patient will follow up with cardiology on Monday to check his kidney function. 3.  Insomnia and memory loss.  Continue trazodone.  Stop Norvasc.  I have seen people with memory loss on this medication. 4.  Essential hypertension.  Continue Toprol and losartan 5.  Hyperlipidemia unspecified.  LDL 95.  Goal less than 70 with NSTEMI.  Patient on Lipitor.  DISCHARGE CONDITIONS:   Satisfactory  CONSULTS OBTAINED:  Treatment Team:  Antonieta Iba, MD Iran Ouch, MD End, Cristal Deer, MD Mady Haagensen, MD  DRUG ALLERGIES:   Allergies  Allergen Reactions  . Sulfa Antibiotics Rash    DISCHARGE MEDICATIONS:   Allergies as of 12/24/2017      Reactions   Sulfa Antibiotics  Rash      Medication List    STOP taking these medications   amLODipine 2.5 MG tablet Commonly known as:  NORVASC   guaiFENesin-codeine 100-10 MG/5ML syrup   NUCYNTA 50 MG tablet Generic drug:  tapentadol   ondansetron 8 MG disintegrating tablet Commonly known as:  ZOFRAN ODT   URIBEL 118 MG Caps     TAKE these medications   aspirin 81 MG chewable tablet Chew 1 tablet (81 mg total) by mouth daily.   atorvastatin 80 MG tablet Commonly known as:  LIPITOR Take 1 tablet (80 mg total) by mouth daily at 6 PM. Notes to patient:  For cholesterol   calcitRIOL 0.25 MCG capsule Commonly known as:  ROCALTROL Take 0.25 mcg by mouth daily.   CENTRUM SILVER 50+MEN Tabs Take 1 tablet by mouth daily.   cyanocobalamin 1000 MCG tablet Take 1,000 mcg by mouth daily.   docusate sodium 100 MG capsule Commonly known as:  COLACE Take 2 capsules (200 mg total) by mouth 2 (two) times daily.   losartan 25 MG tablet Commonly known as:  COZAAR Take 25 mg by mouth at bedtime.   metoprolol succinate 50 MG 24 hr tablet Commonly known as:  TOPROL-XL Take 1 tablet (50 mg total) by mouth daily. Take with or immediately following a meal. What changed:    how much to take  additional instructions   nitroGLYCERIN 0.4 MG SL tablet Commonly known as:  NITROSTAT Place 1 tablet (0.4 mg total) under the tongue  every 5 (five) minutes as needed for chest pain.   ticagrelor 90 MG Tabs tablet Commonly known as:  BRILINTA Take 1 tablet (90 mg total) by mouth 2 (two) times daily.   traZODone 50 MG tablet Commonly known as:  DESYREL Take 50 mg by mouth at bedtime.        DISCHARGE INSTRUCTIONS:    Follow-up PMD 6 days Follow-up cardiology on Monday for creatinine check  If you experience worsening of your admission symptoms, develop shortness of breath, life threatening emergency, suicidal or homicidal thoughts you must seek medical attention immediately by calling 911 or calling your MD  immediately  if symptoms less severe.  You Must read complete instructions/literature along with all the possible adverse reactions/side effects for all the Medicines you take and that have been prescribed to you. Take any new Medicines after you have completely understood and accept all the possible adverse reactions/side effects.   Please note  You were cared for by a hospitalist during your hospital stay. If you have any questions about your discharge medications or the care you received while you were in the hospital after you are discharged, you can call the unit and asked to speak with the hospitalist on call if the hospitalist that took care of you is not available. Once you are discharged, your primary care physician will handle any further medical issues. Please note that NO REFILLS for any discharge medications will be authorized once you are discharged, as it is imperative that you return to your primary care physician (or establish a relationship with a primary care physician if you do not have one) for your aftercare needs so that they can reassess your need for medications and monitor your lab values.    Today   CHIEF COMPLAINT:   Chief Complaint  Patient presents with  . Chest Pain    HISTORY OF PRESENT ILLNESS:  Jesus Bolton  is a 72 y.o. male presented with chest pain and found to have an NSTEMI   VITAL SIGNS:  Blood pressure (!) 157/75, pulse 74, temperature 98.3 F (36.8 C), temperature source Oral, resp. rate 17, height 6' (1.829 m), weight 78.5 kg (173 lb), SpO2 100 %.  I/O:    Intake/Output Summary (Last 24 hours) at 12/24/2017 1444 Last data filed at 12/24/2017 1034 Gross per 24 hour  Intake 920 ml  Output 900 ml  Net 20 ml    PHYSICAL EXAMINATION:  GENERAL:  72 y.o.-year-old patient lying in the bed with no acute distress.  EYES: Pupils equal, round, reactive to light and accommodation. No scleral icterus. Extraocular muscles intact.  HEENT: Head  atraumatic, normocephalic. Oropharynx and nasopharynx clear.  NECK:  Supple, no jugular venous distention. No thyroid enlargement, no tenderness.  LUNGS: Normal breath sounds bilaterally, no wheezing, rales,rhonchi or crepitation. No use of accessory muscles of respiration.  CARDIOVASCULAR: S1, S2 normal. No murmurs, rubs, or gallops.  ABDOMEN: Soft, non-tender, non-distended. Bowel sounds present. No organomegaly or mass.  EXTREMITIES: No pedal edema, cyanosis, or clubbing.  NEUROLOGIC: Cranial nerves II through XII are intact. Muscle strength 5/5 in all extremities. Sensation intact. Gait not checked.  PSYCHIATRIC: The patient is alert and oriented x 3.  SKIN: No obvious rash, lesion, or ulcer.   DATA REVIEW:   CBC Recent Labs  Lab 12/24/17 0633  WBC 7.1  HGB 13.5  HCT 39.4*  PLT 302    Chemistries  Recent Labs  Lab 12/23/17 0527 12/24/17 0633  NA  --  140  K  --  4.2  CL  --  112*  CO2  --  20*  GLUCOSE  --  88  BUN  --  26*  CREATININE 1.95* 1.58*  CALCIUM  --  9.2  AST 47*  --   ALT 17  --   ALKPHOS 71  --   BILITOT 0.9  --     Cardiac Enzymes Recent Labs  Lab 12/23/17 0532  TROPONINI 9.10*      RADIOLOGY:  Dg Chest 2 View  Result Date: 12/22/2017 CLINICAL DATA:  Upper chest pain EXAM: CHEST - 2 VIEW COMPARISON:  None. FINDINGS: Normal mediastinum and cardiac silhouette. Normal pulmonary vasculature. No evidence of effusion, infiltrate, or pneumothorax. No acute bony abnormality. Degenerative osteophytosis of the spine. IMPRESSION: No acute cardiopulmonary process. Electronically Signed   By: Genevive Bi M.D.   On: 12/22/2017 15:18      Management plans discussed with the patient, family and they are in agreement.  CODE STATUS:     Code Status Orders  (From admission, onward)        Start     Ordered   12/23/17 0906  Full code  Continuous     12/23/17 0906    Code Status History    Date Active Date Inactive Code Status Order ID  Comments User Context   12/22/2017 2013 12/23/2017 1610 DNR [id number]  Altamese Dilling, MD Inpatient    Advance Directive Documentation     Most Recent Value  Type of Advance Directive  Living will, Healthcare Power of Attorney  Pre-existing out of facility DNR order (yellow form or pink MOST form)  -  "MOST" Form in Place?  -      TOTAL TIME TAKING CARE OF THIS PATIENT: 35 minutes.    Alford Highland M.D on 12/24/2017 at 2:44 PM  Between 7am to 6pm - Pager - [phone]  After 6pm go to [email or web address] - password [email or web address]  [employer] Office  [phone]  CC: Primary care physician; Dione Housekeeper, MD

## 2017-12-24 NOTE — Progress Notes (Signed)
Cardiovascular and Pulmonary Nurse Navigator Note:  72 year old male with history of CKD Stage II, HTN, BPH, and recent short term memory loss who was admitted with NSTEMI.  Troponin peaked at 10.66.  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    Patient on ASA, Brilinta, Metoprolol, Losartan, and Lipitor.  Patient has Brilinta - 30 day coupon.    Rounded on patient.  Patient lying in bed with [facility] elevated visiting with wife and son.  Patient gave permission for this RN to speak in front of his wife and son.    "Heart Attack Bouncing Back" booklet given and reviewed with patient. Discussed the definition of CAD. Reviewed the location of CAD and where his stent was placed. Informed patient he will be given a stent card. Explained the purpose of the stent card. Instructed patient to keep stent card in his wallet.  ? Discussed modifiable risk factors including controlling blood pressure, cholesterol, and blood sugar; following heart healthy diet; maintaining healthy weight; exercise; and smoking cessation, if applicable.   ? Discussed cardiac medications including rationale for taking, mechanisms of action, and side effects. Stressed the importance of taking medications as prescribed.  ? Discussed emergency plan for heart attack symptoms. Patient verbalized understanding of need to call 911 and not to drive himself to ER if having cardiac symptoms / chest pain.  ? Heart healthy diet of low sodium, low fat, low cholesterol heart healthy diet discussed. Information on diet provided. Patient's wife reported that patient does not use a lot of condiments when eating.  She gave the example of patient eating salad with no salad dressing.    Smoking Cessation - Patient is a NEVER smoker.   ? Exercise - Benefits of exercised discussed. Patient does not currently exercise.  Patient's wife reported that patient has been sick with various health issues since March.  Informed patient that  his cardiologist has referred him to outpatient Cardiac Rehab. An overview of the program was provided. Informational letter, class and orientation times, and CPT billing codes given to patient. Patient plans to check with his insurance company to see what his out-of-pocket expenses will be. Patient and family reporting that Dr. Okey Dupre instructed patient not to start Cardiac Rehab for two to three weeks.    Patient / family appreciative of the information.  ? Army Melia, RN, BSN, [facility]  [place]  [facility] Cardiac & Pulmonary Rehab  Cardiovascular & Pulmonary Nurse Navigator  Direct Line: [phone]  Department Phone #: [phone] Fax: [phone]  Email Address: [email or web address]

## 2017-12-24 NOTE — Progress Notes (Signed)
Free 30 day coupon card for Brilinta given to pt wife. Educated about dosing, use and side effects.   Olene Floss, Pharm.D, BCPS Clinical Pharmacist

## 2017-12-24 NOTE — Progress Notes (Signed)
Pt discharged home today with family, no complaints at this time pt medication regimen and prescriptions provided, and education received by patient and family. Follow up appt information provided, VSS.

## 2017-12-24 NOTE — Telephone Encounter (Signed)
TCM....  Patient is being discharged   They saw R. Dunn  They are scheduled to see Dr. Okey Dupre on 7/31  They were seen for NSTEMI  They need to be seen within 4 days   Pt added to wait list   Please call

## 2017-12-24 NOTE — Plan of Care (Signed)
  Problem: Health Behavior/Discharge Planning: Goal: Ability to manage health-related needs will improve Outcome: Progressing   Problem: Clinical Measurements: Goal: Ability to maintain clinical measurements within normal limits will improve Outcome: Progressing   Problem: Activity: Goal: Risk for activity intolerance will decrease Outcome: Progressing   Problem: Pain Managment: Goal: General experience of comfort will improve Outcome: Progressing   Problem: Cardiac: Goal: Ability to achieve and maintain adequate cardiovascular perfusion will improve Outcome: Progressing

## 2017-12-24 NOTE — Progress Notes (Signed)
[facility]  ROUNDING NOTE   Subjective:  Late entry. Patient was seen prior to discharge. Creatinine down to 1.5. Patient did have stenting of the right coronary artery.   Objective:  Vital signs in last 24 hours:  Temp:  [97.6 F (36.4 C)-98.8 F (37.1 C)] 98.3 F (36.8 C) (07/11 0804) Pulse Rate:  [67-95] 74 (07/11 0804) Resp:  [13-26] 17 (07/11 0804) BP: (141-171)/(74-95) 157/75 (07/11 0804) SpO2:  [97 %-100 %] 100 % (07/11 0804) Weight:  [78.5 kg (173 lb)] 78.5 kg (173 lb) (07/11 0414)  Weight change: -0.454 kg (-1 lb) Filed Weights   12/23/17 0512 12/23/17 1157 12/24/17 0414  Weight: 78.3 kg (172 lb 9.6 oz) 78 kg (172 lb) 78.5 kg (173 lb)    Intake/Output: I/O last 3 completed shifts: In: 548.6 [I.V.:548.6] Out: 1600 [Urine:1600]   Intake/Output this shift:  Total I/O In: 480 [P.O.:480] Out: -   Physical Exam: General: No acute distress  Head: Normocephalic, atraumatic. Moist oral mucosal membranes  Eyes: Anicteric  Neck: Supple, trachea midline  Lungs:  Clear to auscultation, normal effort  Heart: S1S2 no rubs  Abdomen:  Soft, nontender, bowel sounds present  Extremities: No peripheral edema.  Neurologic: Awake, alert, following commands  Skin: No lesions       Basic Metabolic Panel: Recent Labs  Lab 12/22/17 1456 12/23/17 0211 12/23/17 0527 12/24/17 0633  NA 137 141  --  140  K 4.4 4.3  --  4.2  CL 105 109  --  112*  CO2 25 24  --  20*  GLUCOSE 117* 82  --  88  BUN 35* 35*  --  26*  CREATININE 2.30* 2.12* 1.95* 1.58*  CALCIUM 9.6 9.2  --  9.2    Liver Function Tests: Recent Labs  Lab 12/23/17 0527  AST 47*  ALT 17  ALKPHOS 71  BILITOT 0.9  PROT 6.5  ALBUMIN 3.8   No results for input(s): LIPASE, AMYLASE in the last 168 hours. No results for input(s): AMMONIA in the last 168 hours.  CBC: Recent Labs  Lab 12/22/17 1456 12/23/17 0211 12/24/17 0633  WBC 9.8 7.9 7.1  HGB 14.4 13.9 13.5  HCT 41.4 40.6 39.4*   MCV 91.2 92.0 92.0  PLT 348 321 302    Cardiac Enzymes: Recent Labs  Lab 12/22/17 1624 12/22/17 1920 12/22/17 2230 12/23/17 0211 12/23/17 0532  TROPONINI 0.40* 2.91* 7.76* 10.66* 9.10*    BNP: Invalid input(s): POCBNP  CBG: No results for input(s): GLUCAP in the last 168 hours.  Microbiology: No results found for this or any previous visit.  Coagulation Studies: Recent Labs    12/22/17 1624  LABPROT 13.7  INR 1.06    Urinalysis: No results for input(s): COLORURINE, LABSPEC, PHURINE, GLUCOSEU, HGBUR, BILIRUBINUR, KETONESUR, PROTEINUR, UROBILINOGEN, NITRITE, LEUKOCYTESUR in the last 72 hours.  Invalid input(s): APPERANCEUR    Imaging: Dg Chest 2 View  Result Date: 12/22/2017 CLINICAL DATA:  Upper chest pain EXAM: CHEST - 2 VIEW COMPARISON:  None. FINDINGS: Normal mediastinum and cardiac silhouette. Normal pulmonary vasculature. No evidence of effusion, infiltrate, or pneumothorax. No acute bony abnormality. Degenerative osteophytosis of the spine. IMPRESSION: No acute cardiopulmonary process. Electronically Signed   By: Suzy Bouchard M.D.   On: 12/22/2017 15:18     Medications:   . sodium chloride     . aspirin  81 mg Oral Daily  . atorvastatin  80 mg Oral q1800  . calcitRIOL  0.25 mcg Oral QHS  .  docusate sodium  200 mg Oral BID  . heparin  5,000 Units Subcutaneous Q8H  . losartan  25 mg Oral Daily  . metoprolol succinate  50 mg Oral Daily  . multivitamin with minerals   Oral Daily  . sodium chloride flush  3 mL Intravenous Q12H  . ticagrelor  90 mg Oral BID  . traZODone  100 mg Oral QHS  . cyanocobalamin  1,000 mcg Oral Daily   sodium chloride, docusate sodium, nitroGLYCERIN, sodium chloride flush  Assessment/ Plan:  72 y.o. male with past medical history of hypertension, de Quervain's tenosynovitis, BPH status post prostate biopsy and greenlight surgery  1.  Acute renal failure/chronic kidney disease stage III baseline EGFR 47.  The patient's  chronic kidney disease has improved.  Creatinine down to 1.58 with an EGFR of 42.  Encourage the patient to maintain adequate fluid hydration status after he goes home.  Agree with restarting losartan.  2.  Hypertension.  To new amlodipine, metoprolol, and losartan.  3.  Secondary hyperparathyroidism.  We plan to follow-up bone mineral metabolism parameters as an outpatient.  We will also maintain the patient on calcitriol.    LOS: 2 Lyla Jasek 7/11/20193:00 PM

## 2017-12-24 NOTE — Progress Notes (Signed)
Progress Note  Patient Name: Jesus Bolton Date of Encounter: 12/24/2017  Primary Cardiologist: New to [facility] - consult by End  Subjective   No further chest pain. No SOB. Status post PCI/DES to the mid RCA as detailed below. He did cut himself shaving prior to his admission and has noted some oozing of this wound since starting heparin/Brilinta, stable currently. BP remains mildly elevated in the 140s systolic. SCr 1.58.   Inpatient Medications    Scheduled Meds: . amLODipine  5 mg Oral Daily  . aspirin  81 mg Oral Daily  . atorvastatin  80 mg Oral q1800  . calcitRIOL  0.25 mcg Oral QHS  . docusate sodium  200 mg Oral BID  . heparin  5,000 Units Subcutaneous Q8H  . metoprolol succinate  25 mg Oral Daily  . multivitamin with minerals   Oral Daily  . sodium chloride flush  3 mL Intravenous Q12H  . ticagrelor  90 mg Oral BID  . traZODone  100 mg Oral QHS  . cyanocobalamin  1,000 mcg Oral Daily   Continuous Infusions: . sodium chloride     PRN Meds: sodium chloride, docusate sodium, nitroGLYCERIN, sodium chloride flush   Vital Signs    Vitals:   12/23/17 2018 12/23/17 2134 12/24/17 0029 12/24/17 0414  BP: (!) 161/82 (!) 143/78 (!) 165/85 (!) 144/80  Pulse: 80 74 77 83  Resp:   20   Temp: 98.8 F (37.1 C)  97.8 F (36.6 C) 97.6 F (36.4 C)  TempSrc: Oral  Oral Oral  SpO2: 99% 100% 100% 97%  Weight:    173 lb (78.5 kg)  Height:        Intake/Output Summary (Last 24 hours) at 12/24/2017 0726 Last data filed at 12/24/2017 0100 Gross per 24 hour  Intake 440 ml  Output 900 ml  Net -460 ml   Filed Weights   12/23/17 0512 12/23/17 1157 12/24/17 0414  Weight: 172 lb 9.6 oz (78.3 kg) 172 lb (78 kg) 173 lb (78.5 kg)    Telemetry    NSR, occasional atrial tachycardic runs in the 120s bpm, rare PVC - Personally Reviewed  ECG    n/a - Personally Reviewed  Physical Exam   GEN: No acute distress.   Neck: No JVD. Cardiac: RRR, no murmurs, rubs, or gallops. Left  radial cardiac cath site with mild bruising and slight TTP along the proximal aspect. No active bleeding erythema, or warmth. Radial pulse 2+. Respiratory: Clear to auscultation bilaterally.  GI: Soft, nontender, non-distended.   MS: No edema; No deformity. Neuro:  Alert and oriented x 3; Nonfocal.  Psych: Normal affect.  Labs    Chemistry Recent Labs  Lab 12/22/17 1456 12/23/17 0211 12/23/17 0527  NA 137 141  --   K 4.4 4.3  --   CL 105 109  --   CO2 25 24  --   GLUCOSE 117* 82  --   BUN 35* 35*  --   CREATININE 2.30* 2.12* 1.95*  CALCIUM 9.6 9.2  --   PROT  --   --  6.5  ALBUMIN  --   --  3.8  AST  --   --  47*  ALT  --   --  17  ALKPHOS  --   --  71  BILITOT  --   --  0.9  GFRNONAA 27* 29* 33*  GFRAA 31* 34* 38*  ANIONGAP 7 8  --      Hematology  Recent Labs  Lab 12/22/17 1456 12/23/17 0211 12/24/17 0633  WBC 9.8 7.9 7.1  RBC 4.54 4.41 4.28*  HGB 14.4 13.9 13.5  HCT 41.4 40.6 39.4*  MCV 91.2 92.0 92.0  MCH 31.7 31.5 31.6  MCHC 34.7 34.3 34.4  RDW 13.3 13.4 13.2  PLT 348 321 302    Cardiac Enzymes Recent Labs  Lab 12/22/17 1920 12/22/17 2230 12/23/17 0211 12/23/17 0532  TROPONINI 2.91* 7.76* 10.66* 9.10*   No results for input(s): TROPIPOC in the last 168 hours.   BNPNo results for input(s): BNP, PROBNP in the last 168 hours.   DDimer No results for input(s): DDIMER in the last 168 hours.   Radiology    Dg Chest 2 View  Result Date: 12/22/2017 IMPRESSION: No acute cardiopulmonary process. Electronically Signed   By: Genevive Bi M.D.   On: 12/22/2017 15:18    Cardiac Studies   LHC 12/23/2017: Diagnostic  Dominance: Right  Left Main  Vessel is large. Vessel is angiographically normal.  Left Anterior Descending  Vessel is large.  Prox LAD lesion 45% stenosed  Prox LAD lesion is 45% stenosed.  Mid LAD lesion 25% stenosed  Mid LAD lesion is 25% stenosed.  First Diagonal Branch  Vessel is large in size.  Second Diagonal Branch    Vessel is moderate in size.  Left Circumflex  Vessel is large. Vessel is angiographically normal.  First Obtuse Marginal Branch  Vessel is small in size.  Second Obtuse Marginal Branch  Vessel is large in size.  Third Obtuse Marginal Branch  Vessel is small in size.  Right Coronary Artery  Vessel is large. The vessel is moderately tortuous.  Ost RCA to Prox RCA lesion 25% stenosed  Ost RCA to Prox RCA lesion is 25% stenosed.  Mid RCA lesion 80% stenosed  Mid RCA lesion is 80% stenosed. The lesion is focal and ulcerative.  Dist RCA lesion 20% stenosed  Dist RCA lesion is 20% stenosed.  Intervention   Mid RCA lesion  Stent  Lesion crossed with guidewire. A drug-eluting stent was successfully placed using a STENT SIERRA 3.25 X 12 MM. Stent strut is well apposed. See technique section for details of intervention.  Post-Intervention Lesion Assessment  The intervention was successful. Pre-interventional TIMI flow is 3. Post-intervention TIMI flow is 3. No complications occurred at this lesion.  There is no residual stenosis post intervention.  Left Heart   Left Ventricle LV end diastolic pressure is low. LVEDP 5 mmHg.  Aortic Valve There is no aortic valve stenosis.  Coronary Diagrams   Diagnostic Diagram       Post-Intervention Diagram        Conclusions: 1. Severe single-vessel coronary artery disease with ulcerated mid RCA stenosis, which likely represents the culprit vessel for the patient's NSTEMI. 2. Mild to moderate, nonobstructive CAD involving the LAD as well as the ostial and distal RCA. 3. Low normal left ventricular filling pressure (LVEDP 5 mmHg). 4. Successful PCI to mid RCA using Xience Sierra 3.25 x 12 mm drug-eluting stent (postdilated to 3.9 mm) with 0% residual stenosis and TIMI-3 flow.  Recommendations: 1. Aggressive secondary prevention including high intensity statin therapy. 2. Continue overnight hydration given chronic kidney disease and a low  LVEDP.  Renal function will need to be closely monitored during and after hospitalization. 3. Optimize evidence-based heart failure therapy given mildly to moderately reduced LVEF by echocardiogram.  Recommend uninterrupted dual antiplatelet therapy with Aspirin 81mg  daily and Ticagrelor 90mg  twice daily for a  minimum of 12 months (ACS - Class I recommendation).     Echo 12/23/2017: Study Conclusions  - Left ventricle: The cavity size was normal. Wall thickness was   increased in a pattern of mild LVH. Systolic function was mildly   to moderately reduced. The estimated ejection fraction was in the   range of 40% to 45%. Diffuse hypokinesis. Regional wall motion   abnormalities cannot be excluded. Doppler parameters are   consistent with abnormal left ventricular relaxation (grade 1   diastolic dysfunction). - Aortic valve: Trileaflet; mildly thickened, mildly calcified   leaflets. Transvalvular velocity was within the normal range.   There was no stenosis. There was mild regurgitation. - Left atrium: The atrium was mildly dilated. - Right ventricle: The cavity size was normal. Wall thickness was   normal. Systolic function was normal. - Pulmonary arteries: Systolic pressure was mildly to moderately   increased, in the range of 40 mm Hg to 45 mm Hg. - Inferior vena cava: The vessel was dilated. The respirophasic   diameter changes were blunted (< 50%), consistent with elevated   central venous pressure.  Patient Profile     72 y.o. male with history of CKD stage II with a baseline SCr of 1.7-1.8 per patient, HTN, BPH, and recent short-term memory loss who was admitted with a NSTEMI.   Assessment & Plan    1. NSTEMI: -Currently chest pain free -Troponin has peaked at 10.66, now down trending -Continue DAPT with ASA and Brilinta without interruption for at least the next 12 months -Importance of DAPT was discussed in detail -Will consult case manager for Brilinta card -Post  cath instructions -Cardiac rehab -Aggressive secondary prevention   2. Acute systolic CHF due to ICM/pulmonary HTN: -He does not appear grossly volume up -Stop amlodipine given his cardiomyopathy  -Increase Toprol XL to 50 mg daily -Resume losartan 12.5 mg daily (SCr 1.58 this morning) -Would add spironolactone prior to discharge or in the office at follow up (pending CKD)  2. Acute on CKD stage II: -Nephrology has been consulted -SCr stable this morning -Will need close monitoring of renal function as an outpatient as well (will send office a message to schedule BMP on 7/15)  3. HTN: -BP remained elevated this morning -Losartan held given his acute on CKD as above, resume -Amlodipine held given CM -Increase Toprol XL to 50 mg daily  4. HLD: -Start Lipitor 80 mg daily -AST mildly elevated -Recheck lipids as an outpatient in ~ 8 weeks   For questions or updates, please contact [facility] Please consult [email or web address] for contact info under Cardiology/STEMI.    Signed, Eula Listen, PA-C [facility] Pager: [phone] 12/24/2017, 7:26 AM

## 2017-12-24 NOTE — Telephone Encounter (Signed)
Patient contacted regarding discharge from [facility] on 12/24/17.   Patient understands to follow up with provider ? On 01/13/18 at 8:20 at [facility].  Patient understands discharge instructions? Yes  Patient understands medications and regiment? Yes   Patient understands to bring all medications to this visit? Yes    Eula Listen M, PA-C  P [facility] Triage        Please schedule patient for BMP on 12/28/17. Dx: CKD stage II.    Patient is also aware to get the BMP on Monday at the [facility].

## 2017-12-28 ENCOUNTER — Other Ambulatory Visit
Admission: RE | Admit: 2017-12-28 | Discharge: 2017-12-28 | Disposition: A | Discharge status: Home / Self Care | Payer: Medicare Other | Source: Ambulatory Visit | Attending: Physician Assistant | Admitting: Physician Assistant

## 2017-12-28 DIAGNOSIS — Z79899 Other long term (current) drug therapy: Secondary | ICD-10-CM | POA: Diagnosis present

## 2017-12-28 DIAGNOSIS — I214 Non-ST elevation (NSTEMI) myocardial infarction: Secondary | ICD-10-CM | POA: Insufficient documentation

## 2017-12-28 LAB — BASIC METABOLIC PANEL
Anion gap: 10 (ref 5–15)
BUN: 24 mg/dL — ABNORMAL HIGH (ref 8–23)
CHLORIDE: 106 mmol/L (ref 98–111)
CO2: 24 mmol/L (ref 22–32)
Calcium: 9.8 mg/dL (ref 8.9–10.3)
Creatinine, Ser: 1.79 mg/dL — ABNORMAL HIGH (ref 0.61–1.24)
GFR, EST AFRICAN AMERICAN: 42 mL/min — AB (ref >60)
GFR, EST NON AFRICAN AMERICAN: 36 mL/min — AB (ref >60)
Glucose, Bld: 101 mg/dL — ABNORMAL HIGH (ref 70–99)
POTASSIUM: 4.5 mmol/L (ref 3.5–5.1)
SODIUM: 140 mmol/L (ref 135–145)

## 2018-01-11 ENCOUNTER — Encounter: Payer: Medicare Other | Attending: Internal Medicine | Admitting: *Deleted

## 2018-01-11 ENCOUNTER — Encounter: Payer: Self-pay | Admitting: *Deleted

## 2018-01-11 VITALS — Ht 71.6 in | Wt 169.6 lb

## 2018-01-11 DIAGNOSIS — Z955 Presence of coronary angioplasty implant and graft: Secondary | ICD-10-CM | POA: Diagnosis present

## 2018-01-11 DIAGNOSIS — I214 Non-ST elevation (NSTEMI) myocardial infarction: Secondary | ICD-10-CM | POA: Diagnosis not present

## 2018-01-11 NOTE — Patient Instructions (Signed)
Patient Instructions  Patient Details  Name: Jesus Bolton MRN: [id number] Date of Birth: 1946/03/22 Referring Provider:  Yvonne Kendall, MD  Below are your personal goals for exercise, nutrition, and risk factors. Our goal is to help you stay on track towards obtaining and maintaining these goals. We will be discussing your progress on these goals with you throughout the program.  Initial Exercise Prescription: Initial Exercise Prescription - 01/11/18 1500      Date of Initial Exercise RX and Referring Provider   Date  01/11/18    Referring Provider  End, Cristal Deer MD      Treadmill   MPH  2.4    Grade  1    Minutes  15    METs  3.17      Recumbant Bike   Level  2    RPM  50    Watts  28    Minutes  15    METs  3      NuStep   Level  2    SPM  80    Minutes  15    METs  3      Prescription Details   Frequency (times per week)  2    Duration  Progress to 30 minutes of continuous aerobic without signs/symptoms of physical distress      Intensity   THRR 40-80% of Max Heartrate  109-135    Ratings of Perceived Exertion  11-13    Perceived Dyspnea  0-4      Progression   Progression  Continue to progress workloads to maintain intensity without signs/symptoms of physical distress.      Resistance Training   Training Prescription  Yes    Weight  3 lbs    Reps  10-15       Exercise Goals: Frequency: Be able to perform aerobic exercise two to three times per week in program working toward 2-5 days per week of home exercise.  Intensity: Work with a perceived exertion of 11 (fairly light) - 15 (hard) while following your exercise prescription.  We will make changes to your prescription with you as you progress through the program.   Duration: Be able to do 30 to 45 minutes of continuous aerobic exercise in addition to a 5 minute warm-up and a 5 minute cool-down routine.   Nutrition Goals: Your personal nutrition goals will be established when you do your  nutrition analysis with the dietician.  The following are general nutrition guidelines to follow: Cholesterol < 200mg /day Sodium < 1500mg /day Fiber: Men over 50 yrs - 30 grams per day  Personal Goals: Personal Goals and Risk Factors at Admission - 01/11/18 1320      Core Components/Risk Factors/Patient Goals on Admission    Weight Management  Yes;Weight Gain    Intervention  Weight Management: Develop a combined nutrition and exercise program designed to reach desired caloric intake, while maintaining appropriate intake of nutrient and fiber, sodium and fats, and appropriate energy expenditure required for the weight goal.;Weight Management: Provide education and appropriate resources to help participant work on and attain dietary goals.    Admit Weight  169 lb 9.6 oz (76.9 kg)    Goal Weight: Short Term  174 lb (78.9 kg)    Goal Weight: Long Term  180 lb (81.6 kg)    Expected Outcomes  Short Term: Continue to assess and modify interventions until short term weight is achieved;Long Term: Adherence to nutrition and physical activity/exercise program aimed toward attainment  of established weight goal;Weight Gain: Understanding of general recommendations for a high calorie, high protein meal plan that promotes weight gain by distributing calorie intake throughout the day with the consumption for 4-5 meals, snacks, and/or supplements;Understanding of distribution of calorie intake throughout the day with the consumption of 4-5 meals/snacks;Understanding recommendations for meals to include 15-35% energy as protein, 25-35% energy from fat, 35-60% energy from carbohydrates, less than 200mg  of dietary cholesterol, 20-35 gm of total fiber daily    Hypertension  Yes    Intervention  Provide education on lifestyle modifcations including regular physical activity/exercise, weight management, moderate sodium restriction and increased consumption of fresh fruit, vegetables, and low fat dairy, alcohol  moderation, and smoking cessation.;Monitor prescription use compliance.    Expected Outcomes  Short Term: Continued assessment and intervention until BP is < 140/90mm HG in hypertensive participants. < 130/80mm HG in hypertensive participants with diabetes, heart failure or chronic kidney disease.;Long Term: Maintenance of blood pressure at goal levels.    Lipids  Yes    Intervention  Provide education and support for participant on nutrition & aerobic/resistive exercise along with prescribed medications to achieve LDL 70mg , HDL >40mg .    Expected Outcomes  Short Term: Participant states understanding of desired cholesterol values and is compliant with medications prescribed. Participant is following exercise prescription and nutrition guidelines.;Long Term: Cholesterol controlled with medications as prescribed, with individualized exercise RX and with personalized nutrition plan. Value goals: LDL < 70mg , HDL > 40 mg.       Tobacco Use Initial Evaluation: Social History   Tobacco Use  Smoking Status Never Smoker  Smokeless Tobacco Never Used    Exercise Goals and Review: Exercise Goals    Row Name 01/11/18 1504             Exercise Goals   Increase Physical Activity  Yes       Intervention  Provide advice, education, support and counseling about physical activity/exercise needs.;Develop an individualized exercise prescription for aerobic and resistive training based on initial evaluation findings, risk stratification, comorbidities and participant's personal goals.       Expected Outcomes  Short Term: Attend rehab on a regular basis to increase amount of physical activity.;Long Term: Add in home exercise to make exercise part of routine and to increase amount of physical activity.;Long Term: Exercising regularly at least 3-5 days a week.       Increase Strength and Stamina  Yes       Intervention  Provide advice, education, support and counseling about physical activity/exercise  needs.;Develop an individualized exercise prescription for aerobic and resistive training based on initial evaluation findings, risk stratification, comorbidities and participant's personal goals.       Expected Outcomes  Short Term: Increase workloads from initial exercise prescription for resistance, speed, and METs.;Short Term: Perform resistance training exercises routinely during rehab and add in resistance training at home;Long Term: Improve cardiorespiratory fitness, muscular endurance and strength as measured by increased METs and functional capacity ( EJ caEJ caEJacalyn Lefevree to understand and use rate of perceived exertion (RPE) scale  Yes       Intervention  Provide education and explanation on how to use RPE scale       Expected Outcomes  Short Term: Able to use RPE daily in rehab to express subjective intensity level;Long Term:  Able to use RPE to guide intensity level when exercising independently       Knowledge and understanding of Target Heart Rate Range (THRR)  Yes       Intervention  Provide education and explanation of THRR including how the numbers were predicted and where they are located for reference       Expected Outcomes  Short Term: Able to state/look up THRR;Short Term: Able to use daily as guideline for intensity in rehab;Long Term: Able to use THRR to govern intensity when exercising independently       Able to check pulse independently  Yes       Intervention  Provide education and demonstration on how to check pulse in carotid and radial arteries.;Review the importance of being able to check your own pulse for safety during independent exercise       Expected Outcomes  Short Term: Able to explain why pulse checking is important during independent exercise;Long Term: Able to check pulse independently and accurately       Understanding of Exercise Prescription  Yes       Intervention  Provide education, explanation, and written materials on patient's individual exercise prescription        Expected Outcomes  Short Term: Able to explain program exercise prescription;Long Term: Able to explain home exercise prescription to exercise independently          Copy of goals given to participant.

## 2018-01-11 NOTE — Progress Notes (Signed)
Cardiac Individual Treatment Plan  Patient Details  Name: Jesus Bolton MRN: [id number] Date of Birth: June 23, 1945 Referring Provider:     Cardiac Rehab from 01/11/2018 in [facility] Cardiac and Pulmonary Rehab  Referring Provider  End, Harrell Gave MD      Initial Encounter Date:    Cardiac Rehab from 01/11/2018 in [facility] Cardiac and Pulmonary Rehab  Date  01/11/18      Visit Diagnosis: NSTEMI (non-ST elevated myocardial infarction) [facility])  Status post coronary artery stent placement  Patient's Home Medications on Admission:  Current Outpatient Medications:  .  aspirin 81 MG chewable tablet, Chew 1 tablet (81 mg total) by mouth daily., Disp: 30 tablet, Rfl: 0 .  atorvastatin (LIPITOR) 80 MG tablet, Take 1 tablet (80 mg total) by mouth daily at 6 PM., Disp: 30 tablet, Rfl: 0 .  busPIRone (BUSPAR) 7.5 MG tablet, , Disp: , Rfl:  .  calcitRIOL (ROCALTROL) 0.25 MCG capsule, Take 0.25 mcg by mouth daily., Disp: , Rfl:  .  cyanocobalamin 1000 MCG tablet, Take 1,000 mcg by mouth daily., Disp: , Rfl:  .  losartan (COZAAR) 25 MG tablet, Take 25 mg by mouth at bedtime., Disp: , Rfl:  .  metoprolol succinate (TOPROL-XL) 50 MG 24 hr tablet, Take 1 tablet (50 mg total) by mouth daily. Take with or immediately following a meal., Disp: 30 tablet, Rfl: 0 .  Multiple Vitamins-Minerals (CENTRUM SILVER 50+MEN) TABS, Take 1 tablet by mouth daily., Disp: , Rfl:  .  nitroGLYCERIN (NITROSTAT) 0.4 MG SL tablet, Place 1 tablet (0.4 mg total) under the tongue every 5 (five) minutes as needed for chest pain., Disp: 30 tablet, Rfl: 0 .  ticagrelor (BRILINTA) 90 MG TABS tablet, Take 1 tablet (90 mg total) by mouth 2 (two) times daily., Disp: 60 tablet, Rfl: 0 .  docusate sodium (COLACE) 100 MG capsule, Take 2 capsules (200 mg total) by mouth 2 (two) times daily. (Patient not taking: Reported on 01/11/2018), Disp: 120 capsule, Rfl: 3 .  traZODone (DESYREL) 50 MG tablet, Take 50 mg by mouth at bedtime., Disp: , Rfl:   Past  Medical History: Past Medical History:  Diagnosis Date  . BPH (benign prostatic hyperplasia)   . Elevated PSA   . Hypertension   . Indigestion   . Renal disorder   . Thyroid disease     Tobacco Use: Social History   Tobacco Use  Smoking Status Never Smoker  Smokeless Tobacco Never Used    Labs: Recent Review Scientist, physiological    Labs for ITP Cardiac and Pulmonary Rehab Latest Ref Rng & Units 12/22/2017 12/23/2017   Cholestrol 0 - 200 mg/dL - 163   LDLCALC 0 - 99 mg/dL - 95   HDL >40 mg/dL - 32(L)   Trlycerides <150 mg/dL - 179(H)   Hemoglobin A1c 4.8 - 5.6 % 5.2 -       Exercise Target Goals: Date: 01/11/18  Exercise Program Goal: Individual exercise prescription set using results from initial 6 min walk test and THRR while considering  patient's activity barriers and safety.   Exercise Prescription Goal: Initial exercise prescription builds to 30-45 minutes a day of aerobic activity, 2-3 days per week.  Home exercise guidelines will be given to patient during program as part of exercise prescription that the participant will acknowledge.  Activity Barriers & Risk Stratification: Activity Barriers & Cardiac Risk Stratification - 01/11/18 1336      Activity Barriers & Cardiac Risk Stratification   Activity Barriers  Arthritis;Muscular Weakness;Shortness  of Breath;Deconditioning;Balance Concerns    Cardiac Risk Stratification  High       6 Minute Walk: 6 Minute Walk    Row Name 01/11/18 1448         6 Minute Walk   Phase  Initial     Distance  1325 feet     Walk Time  6 minutes     # of Rest Breaks  0     MPH  2.51     METS  3.16     RPE  13     VO2 Peak  11.04     Symptoms  No     Resting HR  83 bpm     Resting BP  128/70     Resting Oxygen Saturation   100 %     Exercise Oxygen Saturation  during 6 min walk  99 %     Max Ex. HR  92 bpm     Max Ex. BP  138/74     2 Minute Post BP  122/70        Oxygen Initial Assessment:   Oxygen  Re-Evaluation:   Oxygen Discharge (Final Oxygen Re-Evaluation):   Initial Exercise Prescription: Initial Exercise Prescription - 01/11/18 1500      Date of Initial Exercise RX and Referring Provider   Date  01/11/18    Referring Provider  End, Harrell Gave MD      Treadmill   MPH  2.4    Grade  1    Minutes  15    METs  3.17      Recumbant Bike   Level  2    RPM  50    Watts  28    Minutes  15    METs  3      NuStep   Level  2    SPM  80    Minutes  15    METs  3      Prescription Details   Frequency (times per week)  2    Duration  Progress to 30 minutes of continuous aerobic without signs/symptoms of physical distress      Intensity   THRR 40-80% of Max Heartrate  109-135    Ratings of Perceived Exertion  11-13    Perceived Dyspnea  0-4      Progression   Progression  Continue to progress workloads to maintain intensity without signs/symptoms of physical distress.      Resistance Training   Training Prescription  Yes    Weight  3 lbs    Reps  10-15       Perform Capillary Blood Glucose checks as needed.  Exercise Prescription Changes: Exercise Prescription Changes    Row Name 01/11/18 1300             Response to Exercise   Blood Pressure (Admit)  128/70       Blood Pressure (Exercise)  138/74       Blood Pressure (Exit)  122/70       Heart Rate (Admit)  83 bpm       Heart Rate (Exercise)  92 bpm       Heart Rate (Exit)  82 bpm       Oxygen Saturation (Admit)  100 %       Oxygen Saturation (Exercise)  99 %       Rating of Perceived Exertion (Exercise)  13       Symptoms  walk test  results          Exercise Comments:   Exercise Goals and Review: Exercise Goals    Row Name 01/11/18 1504             Exercise Goals   Increase Physical Activity  Yes       Intervention  Provide advice, education, support and counseling about physical activity/exercise needs.;Develop an individualized exercise prescription for aerobic and resistive  training based on initial evaluation findings, risk stratification, comorbidities and participant's personal goals.       Expected Outcomes  Short Term: Attend rehab on a regular basis to increase amount of physical activity.;Long Term: Add in home exercise to make exercise part of routine and to increase amount of physical activity.;Long Term: Exercising regularly at least 3-5 days a week.       Increase Strength and Stamina  Yes       Intervention  Provide advice, education, support and counseling about physical activity/exercise needs.;Develop an individualized exercise prescription for aerobic and resistive training based on initial evaluation findings, risk stratification, comorbidities and participant's personal goals.       Expected Outcomes  Short Term: Increase workloads from initial exercise prescription for resistance, speed, and METs.;Short Term: Perform resistance training exercises routinely during rehab and add in resistance training at home;Long Term: Improve cardiorespiratory fitness, muscular endurance and strength as measured by increased METs and functional capacity (6MWT)       Able to understand and use rate of perceived exertion (RPE) scale  Yes       Intervention  Provide education and explanation on how to use RPE scale       Expected Outcomes  Short Term: Able to use RPE daily in rehab to express subjective intensity level;Long Term:  Able to use RPE to guide intensity level when exercising independently       Knowledge and understanding of Target Heart Rate Range (THRR)  Yes       Intervention  Provide education and explanation of THRR including how the numbers were predicted and where they are located for reference       Expected Outcomes  Short Term: Able to state/look up THRR;Short Term: Able to use daily as guideline for intensity in rehab;Long Term: Able to use THRR to govern intensity when exercising independently       Able to check pulse independently  Yes        Intervention  Provide education and demonstration on how to check pulse in carotid and radial arteries.;Review the importance of being able to check your own pulse for safety during independent exercise       Expected Outcomes  Short Term: Able to explain why pulse checking is important during independent exercise;Long Term: Able to check pulse independently and accurately       Understanding of Exercise Prescription  Yes       Intervention  Provide education, explanation, and written materials on patient's individual exercise prescription       Expected Outcomes  Short Term: Able to explain program exercise prescription;Long Term: Able to explain home exercise prescription to exercise independently          Exercise Goals Re-Evaluation :   Discharge Exercise Prescription (Final Exercise Prescription Changes): Exercise Prescription Changes - 01/11/18 1300      Response to Exercise   Blood Pressure (Admit)  128/70    Blood Pressure (Exercise)  138/74    Blood Pressure (Exit)  122/70    Heart  Rate (Admit)  83 bpm    Heart Rate (Exercise)  92 bpm    Heart Rate (Exit)  82 bpm    Oxygen Saturation (Admit)  100 %    Oxygen Saturation (Exercise)  99 %    Rating of Perceived Exertion (Exercise)  13    Symptoms  walk test results       Nutrition:  Target Goals: Understanding of nutrition guidelines, daily intake of sodium <1511m, cholesterol <2057m calories 30% from fat and 7% or less from saturated fats, daily to have 5 or more servings of fruits and vegetables.  Biometrics: Pre Biometrics - 01/11/18 1504      Pre Biometrics   Height  5' 11.6" (1.819 m)    Weight  169 lb 9.6 oz (76.9 kg)    Waist Circumference  34.5 inches    Hip Circumference  35.5 inches    Waist to Hip Ratio  0.97 %    BMI (Calculated)  23.25    Single Leg Stand  2.88 seconds        Nutrition Therapy Plan and Nutrition Goals: Nutrition Therapy & Goals - 01/11/18 1323      Intervention Plan    Intervention  Prescribe, educate and counsel regarding individualized specific dietary modifications aiming towards targeted core components such as weight, hypertension, lipid management, diabetes, heart failure and other comorbidities.;Nutrition handout(s) given to patient.    Expected Outcomes  Short Term Goal: Understand basic principles of dietary content, such as calories, fat, sodium, cholesterol and nutrients.;Short Term Goal: A plan has been developed with personal nutrition goals set during dietitian appointment.;Long Term Goal: Adherence to prescribed nutrition plan.       Nutrition Assessments: Nutrition Assessments - 01/11/18 1030      MEDFICTS Scores   Pre Score  32       Nutrition Goals Re-Evaluation:   Nutrition Goals Discharge (Final Nutrition Goals Re-Evaluation):   Psychosocial: Target Goals: Acknowledge presence or absence of significant depression and/or stress, maximize coping skills, provide positive support system. Participant is able to verbalize types and ability to use techniques and skills needed for reducing stress and depression.   Initial Review & Psychosocial Screening: Initial Psych Review & Screening - 01/11/18 1323      Initial Review   Current issues with  Current Depression;Current Stress Concerns;Current Sleep Concerns;Current Anxiety/Panic    Source of Stress Concerns  Unable to perform yard/household activities;Unable to participate in former interests or hobbies;Poor Coping Skills;Financial    Comments  Ever since April, [place] has had memory issues, unintentional weight loss (30 lbs), issues sleeping and more anxiety. They think he possibly could have had a mini stroke, but no tests showed those results. He hasn't slept through the night since April. His family has noticed issues with his ability to process information.       Family Dynamics   Good Support System?  Yes Wife, family, friends, PaDoristine Bosworth    Barriers   Psychosocial barriers to  participate in program  There are no identifiable barriers or psychosocial needs.;The patient should benefit from training in stress management and relaxation.      Screening Interventions   Interventions  Encouraged to exercise;Provide feedback about the scores to participant;Program counselor consult;To provide support and resources with identified psychosocial needs    Expected Outcomes  Short Term goal: Utilizing psychosocial counselor, staff and physician to assist with identification of specific Stressors or current issues interfering with healing process. Setting desired goal for each  stressor or current issue identified.;Short Term goal: Identification and review with participant of any Quality of Life or Depression concerns found by scoring the questionnaire.;Long Term Goal: Stressors or current issues are controlled or eliminated.;Long Term goal: The participant improves quality of Life and PHQ9 Scores as seen by post scores and/or verbalization of changes       Quality of Life Scores:  Quality of Life - 01/11/18 1030      Quality of Life   Select  Quality of Life      Quality of Life Scores   Health/Function Pre  19.2 %    Socioeconomic Pre  20.67 %    Psych/Spiritual Pre  20.5 %    Family Pre  28.8 %    GLOBAL Pre  21.2 %      Scores of 19 and below usually indicate a poorer quality of life in these areas.  A difference of  2-3 points is a clinically meaningful difference.  A difference of 2-3 points in the total score of the Quality of Life Index has been associated with significant improvement in overall quality of life, self-image, physical symptoms, and general health in studies assessing change in quality of life.  PHQ-9: Recent Review Flowsheet Data    Depression screen [facility] 2/9 01/11/2018   Decreased Interest 3   Down, Depressed, Hopeless 3   PHQ - 2 Score 6   Altered sleeping 3   Tired, decreased energy 3   Change in appetite 3   Feeling bad or failure about  yourself  2   Trouble concentrating 2   Moving slowly or fidgety/restless 2   Suicidal thoughts 0   PHQ-9 Score 21   Difficult doing work/chores Somewhat difficult     Interpretation of Total Score  Total Score Depression Severity:  1-4 = Minimal depression, 5-9 = Mild depression, 10-14 = Moderate depression, 15-19 = Moderately severe depression, 20-27 = Severe depression   Psychosocial Evaluation and Intervention:   Psychosocial Re-Evaluation:   Psychosocial Discharge (Final Psychosocial Re-Evaluation):   Vocational Rehabilitation: Provide vocational rehab assistance to qualifying candidates.   Vocational Rehab Evaluation & Intervention: Vocational Rehab - 01/11/18 1334      Initial Vocational Rehab Evaluation & Intervention   Assessment shows need for Vocational Rehabilitation  No       Education: Education Goals: Education classes will be provided on a variety of topics geared toward better understanding of heart health and risk factor modification. Participant will state understanding/return demonstration of topics presented as noted by education test scores.  Learning Barriers/Preferences: Learning Barriers/Preferences - 01/11/18 1333      Learning Barriers/Preferences   Learning Barriers  Exercise Concerns    Learning Preferences  Verbal Instruction;Written Material       Education Topics:  AED/CPR: - Group verbal and written instruction with the use of models to demonstrate the basic use of the AED with the basic ABC's of resuscitation.   General Nutrition Guidelines/Fats and Fiber: -Group instruction provided by verbal, written material, models and posters to present the general guidelines for heart healthy nutrition. Gives an explanation and review of dietary fats and fiber.   Controlling Sodium/Reading Food Labels: -Group verbal and written material supporting the discussion of sodium use in heart healthy nutrition. Review and explanation with models,  verbal and written materials for utilization of the food label.   Exercise Physiology & General Exercise Guidelines: - Group verbal and written instruction with models to review the exercise physiology of the cardiovascular  system and associated critical values. Provides general exercise guidelines with specific guidelines to those with heart or lung disease.    Aerobic Exercise & Resistance Training: - Gives group verbal and written instruction on the various components of exercise. Focuses on aerobic and resistive training programs and the benefits of this training and how to safely progress through these programs..   Flexibility, Balance, Mind/Body Relaxation: Provides group verbal/written instruction on the benefits of flexibility and balance training, including mind/body exercise modes such as yoga, pilates and tai chi.  Demonstration and skill practice provided.   Stress and Anxiety: - Provides group verbal and written instruction about the health risks of elevated stress and causes of high stress.  Discuss the correlation between heart/lung disease and anxiety and treatment options. Review healthy ways to manage with stress and anxiety.   Depression: - Provides group verbal and written instruction on the correlation between heart/lung disease and depressed mood, treatment options, and the stigmas associated with seeking treatment.   Anatomy & Physiology of the Heart: - Group verbal and written instruction and models provide basic cardiac anatomy and physiology, with the coronary electrical and arterial systems. Review of Valvular disease and Heart Failure   Cardiac Procedures: - Group verbal and written instruction to review commonly prescribed medications for heart disease. Reviews the medication, class of the drug, and side effects. Includes the steps to properly store meds and maintain the prescription regimen. (beta blockers and nitrates)   Cardiac Medications I: - Group  verbal and written instruction to review commonly prescribed medications for heart disease. Reviews the medication, class of the drug, and side effects. Includes the steps to properly store meds and maintain the prescription regimen.   Cardiac Medications II: -Group verbal and written instruction to review commonly prescribed medications for heart disease. Reviews the medication, class of the drug, and side effects. (all other drug classes)    Go Sex-Intimacy & Heart Disease, Get SMART - Goal Setting: - Group verbal and written instruction through game format to discuss heart disease and the return to sexual intimacy. Provides group verbal and written material to discuss and apply goal setting through the application of the S.M.A.R.T. Method.   Other Matters of the Heart: - Provides group verbal, written materials and models to describe Stable Angina and Peripheral Artery. Includes description of the disease process and treatment options available to the cardiac patient.   Exercise & Equipment Safety: - Individual verbal instruction and demonstration of equipment use and safety with use of the equipment.   Cardiac Rehab from 01/11/2018 in [facility] Cardiac and Pulmonary Rehab  Date  01/11/18  Educator  [facility]  Instruction Review Code  1- Verbalizes Understanding      Infection Prevention: - Provides verbal and written material to individual with discussion of infection control including proper hand washing and proper equipment cleaning during exercise session.   Falls Prevention: - Provides verbal and written material to individual with discussion of falls prevention and safety.   Cardiac Rehab from 01/11/2018 in [facility] Cardiac and Pulmonary Rehab  Date  01/11/18  Educator  [facility]  Instruction Review Code  1- Verbalizes Understanding      Diabetes: - Individual verbal and written instruction to review signs/symptoms of diabetes, desired ranges of glucose level fasting, after meals and with  exercise. Acknowledge that pre and post exercise glucose checks will be done for 3 sessions at entry of program.   Know Your Numbers and Risk Factors: -Group verbal and written instruction about important  numbers in your health.  Discussion of what are risk factors and how they play a role in the disease process.  Review of Cholesterol, Blood Pressure, Diabetes, and BMI and the role they play in your overall health.   Sleep Hygiene: -Provides group verbal and written instruction about how sleep can affect your health.  Define sleep hygiene, discuss sleep cycles and impact of sleep habits. Review good sleep hygiene tips.    Other: -Provides group and verbal instruction on various topics (see comments)   Knowledge Questionnaire Score: Knowledge Questionnaire Score - 01/11/18 1030      Knowledge Questionnaire Score   Pre Score  24/26 test reviewed with pt today       Core Components/Risk Factors/Patient Goals at Admission: Personal Goals and Risk Factors at Admission - 01/11/18 1320      Core Components/Risk Factors/Patient Goals on Admission    Weight Management  Yes;Weight Gain    Intervention  Weight Management: Develop a combined nutrition and exercise program designed to reach desired caloric intake, while maintaining appropriate intake of nutrient and fiber, sodium and fats, and appropriate energy expenditure required for the weight goal.;Weight Management: Provide education and appropriate resources to help participant work on and attain dietary goals.    Admit Weight  169 lb 9.6 oz (76.9 kg)    Goal Weight: Short Term  174 lb (78.9 kg)    Goal Weight: Long Term  180 lb (81.6 kg)    Expected Outcomes  Short Term: Continue to assess and modify interventions until short term weight is achieved;Long Term: Adherence to nutrition and physical activity/exercise program aimed toward attainment of established weight goal;Weight Gain: Understanding of general recommendations for a high  calorie, high protein meal plan that promotes weight gain by distributing calorie intake throughout the day with the consumption for 4-5 meals, snacks, and/or supplements;Understanding of distribution of calorie intake throughout the day with the consumption of 4-5 meals/snacks;Understanding recommendations for meals to include 15-35% energy as protein, 25-35% energy from fat, 35-60% energy from carbohydrates, less than 262m of dietary cholesterol, 20-35 gm of total fiber daily    Hypertension  Yes    Intervention  Provide education on lifestyle modifcations including regular physical activity/exercise, weight management, moderate sodium restriction and increased consumption of fresh fruit, vegetables, and low fat dairy, alcohol moderation, and smoking cessation.;Monitor prescription use compliance.    Expected Outcomes  Short Term: Continued assessment and intervention until BP is < 140/987mHG in hypertensive participants. < 130/8038mG in hypertensive participants with diabetes, heart failure or chronic kidney disease.;Long Term: Maintenance of blood pressure at goal levels.    Lipids  Yes    Intervention  Provide education and support for participant on nutrition & aerobic/resistive exercise along with prescribed medications to achieve LDL <59m26mDL >40mg37m Expected Outcomes  Short Term: Participant states understanding of desired cholesterol values and is compliant with medications prescribed. Participant is following exercise prescription and nutrition guidelines.;Long Term: Cholesterol controlled with medications as prescribed, with individualized exercise RX and with personalized nutrition plan. Value goals: LDL < 59mg,23m > 40 mg.       Core Components/Risk Factors/Patient Goals Review:    Core Components/Risk Factors/Patient Goals at Discharge (Final Review):    ITP Comments: ITP Comments    Row Name 01/11/18 1316           ITP Comments  Med review completed. Initial ITP  created. Diagnosis can be found in CHL7/9  Comments: Initial ITP

## 2018-01-12 ENCOUNTER — Encounter: Payer: Self-pay | Admitting: Internal Medicine

## 2018-01-12 NOTE — Progress Notes (Signed)
Follow-up Outpatient Visit Date: 01/13/2018  Primary Care Provider: Dione Housekeeper, MD [address] [place] [address]  Chief Complaint: Anxiety  HPI:  [name] is a 72 y.o. year-old male with history of coronary artery disease with NSTEMI (12/23/2017) status post PCI to the mid RCA, ischemic cardiomyopathy (LVEF 40 to 45% by echo in 12/2017), hypertension, CKD stage III, and BPH, who presents for follow-up of recent NSTEMI.  He presented on 12/22/2017 to [facility] complaining of progressive fatigue over the last 2 to 3 months and chest pain that began on the day of presentation.  Troponin peaked at 10.7.  Catheterization (see below) revealed severe single-vessel CAD with successful PCI to the mid RCA.  Today, [name] and his wife are most concerned about significant anxiety that began around April.  He does not sleep well at night despite having been on trazodone (no longer taking).  He remains on buspirone.  He also continues to be off balance at times, though he has not fallen.  He is scheduled for follow-up with neurology in the next week or two.  From a heart standpoint, [name] has done well.  He has not had any further episodes of chest pain.  He reports brief episodes of shortness of breath, most often while seated in a chair.  He will take a deep breath with prompt resolution of the dyspnea.  He does not have any exertional dyspnea.  He denies palpitations, edema, orthopnea, and PND.  He bruises easily but has not had any significant bleeding.  He remains compliant with his medications including aspirin and ticagrelor.  --------------------------------------------------------------------------------------------------  Cardiovascular History & Procedures: Cardiovascular Problems:  Coronary artery disease  Ischemic cardiomyopathy  Risk Factors:  Known coronary artery disease, hypertension, male gender, and age greater than 53  Cath/PCI:  LHC/PCI (12/23/2017): LMCA  normal.  LAD with 40 to 50% proximal stenosis and 20 to 30% mid stenosis.  LCx normal.  RCA with 20 to 30% ostial/proximal and discrete 80% mid vessel stenosis.  20% distal RCA lesion also noted.  Successful PCI to mid RCA using Xience Sierra 3.25 x 12 mm DES (postdilated to 3.9 mm).  CV Surgery:  None  EP Procedures and Devices:  None  Non-Invasive Evaluation(s):  TTE (12/23/2017): Normal LV size with mild LVH.  LVEF 40 to 45% with diffuse hypokinesis and grade 1 diastolic dysfunction.  Aortic sclerosis with mild regurgitation.  Mild left atrial enlargement.  Normal RV size and function.  Mild to moderate pulmonary hypertension (PASP 40 to 45 mmHg) with elevated central venous pressure.  Recent CV Pertinent Labs: Lab Results  Component Value Date   CHOL 163 12/23/2017   HDL 32 (L) 12/23/2017   LDLCALC 95 12/23/2017   TRIG 179 (H) 12/23/2017   CHOLHDL 5.1 12/23/2017   INR 1.06 12/22/2017   K 4.5 12/28/2017   BUN 24 (H) 12/28/2017   CREATININE 1.79 (H) 12/28/2017    Past medical and surgical history were reviewed and updated in EPIC.  Current Meds  Medication Sig  . aspirin 81 MG chewable tablet Chew 1 tablet (81 mg total) by mouth daily.  Marland Kitchen atorvastatin (LIPITOR) 80 MG tablet Take 1 tablet (80 mg total) by mouth daily at 6 PM.  . busPIRone (BUSPAR) 7.5 MG tablet   . calcitRIOL (ROCALTROL) 0.25 MCG capsule Take 0.25 mcg by mouth daily.  . cyanocobalamin 1000 MCG tablet Take 1,000 mcg by mouth daily.  Marland Kitchen losartan (COZAAR) 25 MG tablet Take 25  mg by mouth at bedtime.  . metoprolol succinate (TOPROL-XL) 50 MG 24 hr tablet Take 1 tablet (50 mg total) by mouth daily. Take with or immediately following a meal.  . Multiple Vitamins-Minerals (CENTRUM SILVER 50+MEN) TABS Take 1 tablet by mouth daily.  . nitroGLYCERIN (NITROSTAT) 0.4 MG SL tablet Place 1 tablet (0.4 mg total) under the tongue every 5 (five) minutes as needed for chest pain.  . ticagrelor (BRILINTA) 90 MG TABS tablet Take  1 tablet (90 mg total) by mouth 2 (two) times daily.    Allergies: Sulfa antibiotics  Social History   Tobacco Use  . Smoking status: Never Smoker  . Smokeless tobacco: Never Used  Substance Use Topics  . Alcohol use: No  . Drug use: No    Family History  Problem Relation Age of Onset  . Cancer Mother        a. cardiac tumor    Review of Systems: [name] reports numbness in the left arm as well as a dull ache in the left shoulder this morning.  He wonders if he slept on his arm wrong.  Otherwise, a 12-system review of systems was performed and was negative except as noted in the HPI.  --------------------------------------------------------------------------------------------------  Physical Exam: BP (!) 142/80 (BP Location: Left Arm, Patient Position: Sitting, Cuff Size: Normal)   Pulse 65   Ht 6' (1.829 m)   Wt 171 lb 12 oz (77.9 kg)   BMI 23.29 kg/m   General: NAD. HEENT: No conjunctival pallor or scleral icterus. Moist mucous membranes.  OP clear. Neck: Supple without lymphadenopathy, thyromegaly, JVD, or HJR. Lungs: Normal work of breathing. Clear to auscultation bilaterally without wheezes or crackles. Heart: Regular rate and rhythm without murmurs, rubs, or gallops. Non-displaced PMI. Abd: Bowel sounds present. Soft, NT/ND without hepatosplenomegaly Ext: No lower extremity edema.  Left radial arteriotomy site is well-healed with 2+ pulse. Skin: Warm and dry without rash.  Ecchymosis noted along medial aspect of right upper arm.  EKG: Normal sinus rhythm with left axis deviation and borderline LVH.  Lab Results  Component Value Date   WBC 7.1 12/24/2017   HGB 13.5 12/24/2017   HCT 39.4 (L) 12/24/2017   MCV 92.0 12/24/2017   PLT 302 12/24/2017    Lab Results  Component Value Date   NA 140 12/28/2017   K 4.5 12/28/2017   CL 106 12/28/2017   CO2 24 12/28/2017   BUN 24 (H) 12/28/2017   CREATININE 1.79 (H) 12/28/2017   GLUCOSE 101 (H) 12/28/2017   ALT  17 12/23/2017    Lab Results  Component Value Date   CHOL 163 12/23/2017   HDL 32 (L) 12/23/2017   LDLCALC 95 12/23/2017   TRIG 179 (H) 12/23/2017   CHOLHDL 5.1 12/23/2017    --------------------------------------------------------------------------------------------------  ASSESSMENT AND PLAN: NSTEMI Mr. Koskela is doing well following his hospitalization for an STEMI and PCI to the mid RCA earlier this month.  His episodic shortness of breath is not typical of heart failure or coronary insufficiency.  I wonder if it could be related to ticagrelor.  Given that it is quite brief, we will not pursue further work-up or medication changes at this time.  We will plan to continue dual antiplatelet therapy with aspirin and ticagrelor for at least 12 months.  If dyspnea were to worsen, we could consider transitioning to clopidogrel at some point.  Mr. Ashmead is slated to begin cardiac rehab next week, which I have encouraged.  Chronic systolic heart  failure secondary to ischemic cardiomyopathy LVEF noted to be moderately reduced at the time of presentation earlier this month.  Mr. Petri appears euvolemic and well compensated with NYHA class II symptoms.  We will continue current doses of metoprolol and losartan.  Mr. Hoopingarner reports having labs done through Dr. Cherylann Ratel yesterday.  We will request the results with plan to increase losartan to 50 mg daily if his renal function and potassium are at baseline.  Chronic kidney disease stage III Mr. Suhre was seen by Dr. Cherylann Ratel yesterday with labs performed at that time.  We will request a copy of the labs with plans to increase losartan, if possible.  We will continue to avoid nephrotoxic drugs.  Hypertension Blood pressure mildly elevated today.  As above, we will request recent labs from Dr. Cherylann Ratel and increase losartan to 50 mg daily if renal function and potassium allow.  Mr. Shadburn will need repeat BMP in 1 to 2 weeks if we proceed with this change.  We will  continue his current dose of metoprolol succinate.  Hyperlipidemia Mr. Borromeo is tolerating high intensity statin therapy with atorvastatin 80 mg daily well.  We will plan to repeat a lipid panel in 2-3 months.  Anxiety This seems to be the most significant concern for Mr. Nebel and his wife.  I have encouraged them to speak with Dr. Zada Finders for further evaluation and management.  Follow-up: Return to clinic in 2 months.  Yvonne Kendall, MD 01/13/2018 8:25 AM

## 2018-01-13 ENCOUNTER — Encounter: Payer: Self-pay | Admitting: Internal Medicine

## 2018-01-13 ENCOUNTER — Encounter

## 2018-01-13 ENCOUNTER — Ambulatory Visit: Payer: Medicare Other | Admitting: Internal Medicine

## 2018-01-13 VITALS — BP 142/80 | HR 65 | Ht 72.0 in | Wt 171.8 lb

## 2018-01-13 DIAGNOSIS — N183 Chronic kidney disease, stage 3 unspecified: Secondary | ICD-10-CM

## 2018-01-13 DIAGNOSIS — I10 Essential (primary) hypertension: Secondary | ICD-10-CM | POA: Diagnosis not present

## 2018-01-13 DIAGNOSIS — I255 Ischemic cardiomyopathy: Secondary | ICD-10-CM | POA: Diagnosis not present

## 2018-01-13 DIAGNOSIS — I5022 Chronic systolic (congestive) heart failure: Secondary | ICD-10-CM | POA: Diagnosis not present

## 2018-01-13 DIAGNOSIS — F419 Anxiety disorder, unspecified: Secondary | ICD-10-CM | POA: Insufficient documentation

## 2018-01-13 DIAGNOSIS — I214 Non-ST elevation (NSTEMI) myocardial infarction: Secondary | ICD-10-CM

## 2018-01-13 DIAGNOSIS — E785 Hyperlipidemia, unspecified: Secondary | ICD-10-CM

## 2018-01-13 DIAGNOSIS — E782 Mixed hyperlipidemia: Secondary | ICD-10-CM | POA: Insufficient documentation

## 2018-01-13 MED ORDER — METOPROLOL SUCCINATE ER 50 MG PO TB24: 50.0000 mg | ORAL_TABLET | Freq: Every day | ORAL | 2 refills | Status: DC

## 2018-01-13 NOTE — Patient Instructions (Signed)
Medication Instructions:  Your physician recommends that you continue on your current medications as directed. Please refer to the Current Medication list given to you today.   Labwork: Labwork will be requested from your primary care physician.   Testing/Procedures: none  Follow-Up: Your physician recommends that you schedule a follow-up appointment in: 2 MONTHS WITH DR END OR APP.  If you need a refill on your cardiac medications before your next appointment, please call your pharmacy.

## 2018-01-14 ENCOUNTER — Other Ambulatory Visit: Payer: Self-pay | Admitting: *Deleted

## 2018-01-14 DIAGNOSIS — I10 Essential (primary) hypertension: Secondary | ICD-10-CM

## 2018-01-14 DIAGNOSIS — I5022 Chronic systolic (congestive) heart failure: Secondary | ICD-10-CM

## 2018-01-14 MED ORDER — LOSARTAN POTASSIUM 50 MG PO TABS: 50.0000 mg | ORAL_TABLET | Freq: Every day | ORAL | 3 refills | Status: DC

## 2018-01-19 ENCOUNTER — Encounter: Payer: Medicare Other | Attending: Internal Medicine | Admitting: *Deleted

## 2018-01-19 DIAGNOSIS — Z955 Presence of coronary angioplasty implant and graft: Secondary | ICD-10-CM | POA: Insufficient documentation

## 2018-01-19 DIAGNOSIS — I214 Non-ST elevation (NSTEMI) myocardial infarction: Secondary | ICD-10-CM | POA: Diagnosis present

## 2018-01-19 NOTE — Progress Notes (Signed)
Daily Session Note  Patient Details  Name: Jesus Bolton MRN: [id number] Date of Birth: Nov 09, 1945 Referring Provider:     Cardiac Rehab from 01/11/2018 in [facility] Rehab  Referring Provider  End, Harrell Gave MD      Encounter Date: 01/19/2018  Check In: Session Check In - 01/19/18 0937      Check-In   Supervising physician immediately available to respond to emergencies  See telemetry face sheet for immediately available ER MD    Location  [facility]-Cardiac & Pulmonary Rehab    Staff Present  Joellyn Rued, BS, PEC;Susanne Bice, RN, BSN, CCRP    Medication changes reported      No    Fall or balance concerns reported     No    Warm-up and Cool-down  Performed on first and last piece of equipment    Resistance Training Performed  Yes    VAD Patient?  No      Pain Assessment   Currently in Pain?  No/denies          Social History   Tobacco Use  Smoking Status Never Smoker  Smokeless Tobacco Never Used    Goals Met:  Exercise tolerated well No report of cardiac concerns or symptoms  Goals Unmet:  Not Applicable  Comments: First full day of exercise!  Patient was oriented to gym and equipment including functions, settings, policies, and procedures.  Patient's individual exercise prescription and treatment plan were reviewed.  All starting workloads were established based on the results of the 6 minute walk test done at initial orientation visit.  The plan for exercise progression was also introduced and progression will be customized based on patient's performance and goals.   Dr. Emily Filbert is Medical Director for [facility] and [facility].

## 2018-01-20 ENCOUNTER — Other Ambulatory Visit: Payer: Self-pay | Admitting: Family Medicine

## 2018-01-20 ENCOUNTER — Other Ambulatory Visit: Payer: Self-pay

## 2018-01-20 DIAGNOSIS — R4182 Altered mental status, unspecified: Secondary | ICD-10-CM

## 2018-01-20 MED ORDER — TICAGRELOR 90 MG PO TABS: 90.0000 mg | ORAL_TABLET | Freq: Two times a day (BID) | ORAL | 2 refills | Status: DC

## 2018-01-20 MED ORDER — ATORVASTATIN CALCIUM 80 MG PO TABS: 80.0000 mg | ORAL_TABLET | Freq: Every day | ORAL | 2 refills | Status: DC

## 2018-01-20 NOTE — Telephone Encounter (Signed)
Please advise if ok to refill. 

## 2018-01-20 NOTE — Telephone Encounter (Signed)
Yes! Please refill both. Thank you!

## 2018-01-21 ENCOUNTER — Encounter: Payer: Medicare Other | Admitting: *Deleted

## 2018-01-21 DIAGNOSIS — I214 Non-ST elevation (NSTEMI) myocardial infarction: Secondary | ICD-10-CM

## 2018-01-21 DIAGNOSIS — Z955 Presence of coronary angioplasty implant and graft: Secondary | ICD-10-CM

## 2018-01-21 NOTE — Progress Notes (Signed)
Daily Session Note  Patient Details  Name: Jesus Bolton MRN: [id number] Date of Birth: Jul 05, 1945 Referring Provider:     Cardiac Rehab from 01/11/2018 in [facility] Cardiac and Pulmonary Rehab  Referring Provider  End, Harrell Gave MD      Encounter Date: 01/21/2018  Check In: Session Check In - 01/21/18 0939      Check-In   Supervising physician immediately available to respond to emergencies  See telemetry face sheet for immediately available ER MD    Location  [facility]-Cardiac & Pulmonary Rehab    Staff Present  Joellyn Rued, BS, PEC;Meredith Sherryll Burger, RN BSN;Joseph Hood RCP,RRT,BSRT    Medication changes reported      No    Fall or balance concerns reported     No    Warm-up and Cool-down  Performed on first and last piece of equipment    Resistance Training Performed  Yes    VAD Patient?  No    PAD/SET Patient?  No      Pain Assessment   Currently in Pain?  No/denies    Multiple Pain Sites  No          Social History   Tobacco Use  Smoking Status Never Smoker  Smokeless Tobacco Never Used    Goals Met:  Independence with exercise equipment Exercise tolerated well No report of cardiac concerns or symptoms Strength training completed today  Goals Unmet:  Not Applicable  Comments: Pt able to follow exercise prescription today without complaint.  Will continue to monitor for progression.    Dr. Emily Filbert is Medical Director for [place] [facility].

## 2018-01-26 ENCOUNTER — Encounter: Payer: Medicare Other | Admitting: *Deleted

## 2018-01-26 DIAGNOSIS — Z955 Presence of coronary angioplasty implant and graft: Secondary | ICD-10-CM

## 2018-01-26 DIAGNOSIS — I214 Non-ST elevation (NSTEMI) myocardial infarction: Secondary | ICD-10-CM

## 2018-01-26 NOTE — Progress Notes (Signed)
Daily Session Note  Patient Details  Name: Jesus Bolton MRN: [id number] Date of Birth: 1945-07-13 Referring Provider:     Cardiac Rehab from 01/11/2018 in [facility] Cardiac and Pulmonary Rehab  Referring Provider  End, Harrell Gave MD      Encounter Date: 01/26/2018  Check In: Session Check In - 01/26/18 0928      Check-In   Supervising physician immediately available to respond to emergencies  See telemetry face sheet for immediately available ER MD    Location  [facility]-Cardiac & Pulmonary Rehab    Staff Present  Joellyn Rued, BS, PEC;Susanne Bice, RN, BSN, CCRP;Anquan Azzarello [place], [place], RCEP, CCRP, Exercise Physiologist    Medication changes reported      No    Fall or balance concerns reported     No    Warm-up and Cool-down  Performed on first and last piece of equipment    Resistance Training Performed  Yes    VAD Patient?  No    PAD/SET Patient?  No      Pain Assessment   Currently in Pain?  No/denies          Social History   Tobacco Use  Smoking Status Never Smoker  Smokeless Tobacco Never Used    Goals Met:  Independence with exercise equipment Exercise tolerated well No report of cardiac concerns or symptoms Strength training completed today  Goals Unmet:  Not Applicable  Comments: Pt able to follow exercise prescription today without complaint.  Will continue to monitor for progression. Reviewed home exercise with pt today.  Pt plans to walk outside at home for exercise.  Reviewed THR, pulse, RPE, sign and symptoms, NTG use, and when to call 911 or MD.  Also discussed weather considerations and indoor options.  Pt voiced understanding.     Dr. Emily Filbert is Medical Director for [facility] and [facility].

## 2018-01-27 ENCOUNTER — Encounter: Payer: Self-pay | Admitting: *Deleted

## 2018-01-27 DIAGNOSIS — Z955 Presence of coronary angioplasty implant and graft: Secondary | ICD-10-CM

## 2018-01-27 DIAGNOSIS — I214 Non-ST elevation (NSTEMI) myocardial infarction: Secondary | ICD-10-CM

## 2018-01-27 NOTE — Progress Notes (Signed)
Cardiac Individual Treatment Plan  Patient Details  Name: Jesus Bolton MRN: [id number] Date of Birth: 1946/02/05 Referring Provider:     Cardiac Rehab from 01/11/2018 in [facility] Cardiac and Pulmonary Rehab  Referring Provider  End, Harrell Gave MD      Initial Encounter Date:    Cardiac Rehab from 01/11/2018 in [facility] Cardiac and Pulmonary Rehab  Date  01/11/18      Visit Diagnosis: NSTEMI (non-ST elevated myocardial infarction) [facility])  Status post coronary artery stent placement  Patient's Home Medications on Admission:  Current Outpatient Medications:  .  aspirin 81 MG chewable tablet, Chew 1 tablet (81 mg total) by mouth daily., Disp: 30 tablet, Rfl: 0 .  atorvastatin (LIPITOR) 80 MG tablet, Take 1 tablet (80 mg total) by mouth daily at 6 PM., Disp: 30 tablet, Rfl: 2 .  busPIRone (BUSPAR) 7.5 MG tablet, , Disp: , Rfl:  .  calcitRIOL (ROCALTROL) 0.25 MCG capsule, Take 0.25 mcg by mouth daily., Disp: , Rfl:  .  cyanocobalamin 1000 MCG tablet, Take 1,000 mcg by mouth daily., Disp: , Rfl:  .  losartan (COZAAR) 50 MG tablet, Take 1 tablet (50 mg total) by mouth daily., Disp: 90 tablet, Rfl: 3 .  metoprolol succinate (TOPROL-XL) 50 MG 24 hr tablet, Take 1 tablet (50 mg total) by mouth daily. Take with or immediately following a meal., Disp: 90 tablet, Rfl: 2 .  Multiple Vitamins-Minerals (CENTRUM SILVER 50+MEN) TABS, Take 1 tablet by mouth daily., Disp: , Rfl:  .  nitroGLYCERIN (NITROSTAT) 0.4 MG SL tablet, Place 1 tablet (0.4 mg total) under the tongue every 5 (five) minutes as needed for chest pain., Disp: 30 tablet, Rfl: 0 .  ticagrelor (BRILINTA) 90 MG TABS tablet, Take 1 tablet (90 mg total) by mouth 2 (two) times daily., Disp: 60 tablet, Rfl: 2  Past Medical History: Past Medical History:  Diagnosis Date  . BPH (benign prostatic hyperplasia)   . Elevated PSA   . Hypertension   . Indigestion   . Renal disorder   . Thyroid disease     Tobacco Use: Social History   Tobacco Use   Smoking Status Never Smoker  Smokeless Tobacco Never Used    Labs: Recent Review Scientist, physiological    Labs for ITP Cardiac and Pulmonary Rehab Latest Ref Rng & Units 12/22/2017 12/23/2017   Cholestrol 0 - 200 mg/dL - 163   LDLCALC 0 - 99 mg/dL - 95   HDL >40 mg/dL - 32(L)   Trlycerides <150 mg/dL - 179(H)   Hemoglobin A1c 4.8 - 5.6 % 5.2 -       Exercise Target Goals: Exercise Program Goal: Individual exercise prescription set using results from initial 6 min walk test and THRR while considering  patient's activity barriers and safety.   Exercise Prescription Goal: Initial exercise prescription builds to 30-45 minutes a day of aerobic activity, 2-3 days per week.  Home exercise guidelines will be given to patient during program as part of exercise prescription that the participant will acknowledge.  Activity Barriers & Risk Stratification: Activity Barriers & Cardiac Risk Stratification - 01/11/18 1336      Activity Barriers & Cardiac Risk Stratification   Activity Barriers  Arthritis;Muscular Weakness;Shortness of Breath;Deconditioning;Balance Concerns    Cardiac Risk Stratification  High       6 Minute Walk: 6 Minute Walk    Row Name 01/11/18 1448         6 Minute Walk   Phase  Initial  Distance  1325 feet     Walk Time  6 minutes     # of Rest Breaks  0     MPH  2.51     METS  3.16     RPE  13     VO2 Peak  11.04     Symptoms  No     Resting HR  83 bpm     Resting BP  128/70     Resting Oxygen Saturation   100 %     Exercise Oxygen Saturation  during 6 min walk  99 %     Max Ex. HR  92 bpm     Max Ex. BP  138/74     2 Minute Post BP  122/70        Oxygen Initial Assessment:   Oxygen Re-Evaluation:   Oxygen Discharge (Final Oxygen Re-Evaluation):   Initial Exercise Prescription: Initial Exercise Prescription - 01/11/18 1500      Date of Initial Exercise RX and Referring Provider   Date  01/11/18    Referring Provider  End, Harrell Gave MD       Treadmill   MPH  2.4    Grade  1    Minutes  15    METs  3.17      Recumbant Bike   Level  2    RPM  50    Watts  28    Minutes  15    METs  3      NuStep   Level  2    SPM  80    Minutes  15    METs  3      Prescription Details   Frequency (times per week)  2    Duration  Progress to 30 minutes of continuous aerobic without signs/symptoms of physical distress      Intensity   THRR 40-80% of Max Heartrate  109-135    Ratings of Perceived Exertion  11-13    Perceived Dyspnea  0-4      Progression   Progression  Continue to progress workloads to maintain intensity without signs/symptoms of physical distress.      Resistance Training   Training Prescription  Yes    Weight  3 lbs    Reps  10-15       Perform Capillary Blood Glucose checks as needed.  Exercise Prescription Changes: Exercise Prescription Changes    Row Name 01/11/18 1300 01/19/18 1100 01/26/18 1000         Response to Exercise   Blood Pressure (Admit)  128/70  132/64  -     Blood Pressure (Exercise)  138/74  164/78  -     Blood Pressure (Exit)  122/70  122/72  -     Heart Rate (Admit)  83 bpm  73 bpm  -     Heart Rate (Exercise)  92 bpm  145 bpm  -     Heart Rate (Exit)  82 bpm  104 bpm  -     Oxygen Saturation (Admit)  100 %  -  -     Oxygen Saturation (Exercise)  99 %  -  -     Rating of Perceived Exertion (Exercise)  13  15  -     Symptoms  walk test results  none  -     Comments  -  first full day of exercise   -     Duration  -  Continue with 30 min of aerobic exercise without signs/symptoms of physical distress.  -     Intensity  -  THRR unchanged  -       Progression   Progression  -  Continue to progress workloads to maintain intensity without signs/symptoms of physical distress.  -     Average METs  -  2.79  -       Resistance Training   Training Prescription  -  Yes  -     Weight  -  3 lbs  -     Reps  -  10-15  -       Treadmill   MPH  -  1.6  -     Grade  -  1  -      Minutes  -  15  -     METs  -  2.45  -       Recumbant Bike   Level  -  2  -     Watts  -  28  -     Minutes  -  15  -     METs  -  3.14  -       Home Exercise Plan   Plans to continue exercise at  -  -  Home (comment) walking     Frequency  -  -  Add 3 additional days to program exercise sessions.     Initial Home Exercises Provided  -  -  01/26/18        Exercise Comments: Exercise Comments    Row Name 01/19/18 0940           Exercise Comments  First full day of exercise!  Patient was oriented to gym and equipment including functions, settings, policies, and procedures.  Patient's individual exercise prescription and treatment plan were reviewed.  All starting workloads were established based on the results of the 6 minute walk test done at initial orientation visit.  The plan for exercise progression was also introduced and progression will be customized based on patient's performance and goals.          Exercise Goals and Review: Exercise Goals    Row Name 01/11/18 1504             Exercise Goals   Increase Physical Activity  Yes       Intervention  Provide advice, education, support and counseling about physical activity/exercise needs.;Develop an individualized exercise prescription for aerobic and resistive training based on initial evaluation findings, risk stratification, comorbidities and participant's personal goals.       Expected Outcomes  Short Term: Attend rehab on a regular basis to increase amount of physical activity.;Long Term: Add in home exercise to make exercise part of routine and to increase amount of physical activity.;Long Term: Exercising regularly at least 3-5 days a week.       Increase Strength and Stamina  Yes       Intervention  Provide advice, education, support and counseling about physical activity/exercise needs.;Develop an individualized exercise prescription for aerobic and resistive training based on initial evaluation findings, risk  stratification, comorbidities and participant's personal goals.       Expected Outcomes  Short Term: Increase workloads from initial exercise prescription for resistance, speed, and METs.;Short Term: Perform resistance training exercises routinely during rehab and add in resistance training at home;Long Term: Improve cardiorespiratory fitness, muscular endurance and strength as measured by increased METs and functional capacity (  6MWT)       Able to understand and use rate of perceived exertion (RPE) scale  Yes       Intervention  Provide education and explanation on how to use RPE scale       Expected Outcomes  Short Term: Able to use RPE daily in rehab to express subjective intensity level;Long Term:  Able to use RPE to guide intensity level when exercising independently       Knowledge and understanding of Target Heart Rate Range (THRR)  Yes       Intervention  Provide education and explanation of THRR including how the numbers were predicted and where they are located for reference       Expected Outcomes  Short Term: Able to state/look up THRR;Short Term: Able to use daily as guideline for intensity in rehab;Long Term: Able to use THRR to govern intensity when exercising independently       Able to check pulse independently  Yes       Intervention  Provide education and demonstration on how to check pulse in carotid and radial arteries.;Review the importance of being able to check your own pulse for safety during independent exercise       Expected Outcomes  Short Term: Able to explain why pulse checking is important during independent exercise;Long Term: Able to check pulse independently and accurately       Understanding of Exercise Prescription  Yes       Intervention  Provide education, explanation, and written materials on patient's individual exercise prescription       Expected Outcomes  Short Term: Able to explain program exercise prescription;Long Term: Able to explain home exercise  prescription to exercise independently          Exercise Goals Re-Evaluation : Exercise Goals Re-Evaluation    [place] Name 01/19/18 0940 01/26/18 1044           Exercise Goal Re-Evaluation   Exercise Goals Review  Increase Physical Activity;Able to understand and use rate of perceived exertion (RPE) scale;Knowledge and understanding of Target Heart Rate Range (THRR);Understanding of Exercise Prescription;Increase Strength and Stamina  Increase Physical Activity;Able to understand and use rate of perceived exertion (RPE) scale;Knowledge and understanding of Target Heart Rate Range (THRR);Understanding of Exercise Prescription;Increase Strength and Stamina      Comments  Reviewed RPE scale, THR and program prescription with pt today.  Pt voiced understanding and was given a copy of goals to take home.  Reviewed home exercise with pt today.  Pt plans to walk outside at home for exercise.  Reviewed THR, pulse, RPE, sign and symptoms, NTG use, and when to call 911 or MD.  Also discussed weather considerations and indoor options.  Pt voiced understanding.      Expected Outcomes  Short: Use RPE daily to regulate intensity. Long: Follow program prescription in THR.  Short: add an additonal 3 days of exercise at home. Long: become comfortable exercising on his own          Discharge Exercise Prescription (Final Exercise Prescription Changes): Exercise Prescription Changes - 01/26/18 1000      Home Exercise Plan   Plans to continue exercise at  Home (comment)   walking   Frequency  Add 3 additional days to program exercise sessions.    Initial Home Exercises Provided  01/26/18       Nutrition:  Target Goals: Understanding of nutrition guidelines, daily intake of sodium <1580m, cholesterol <2054m calories 30% from fat and  7% or less from saturated fats, daily to have 5 or more servings of fruits and vegetables.  Biometrics: Pre Biometrics - 01/11/18 1504      Pre Biometrics   Height  5' 11.6"  (1.819 m)    Weight  169 lb 9.6 oz (76.9 kg)    Waist Circumference  34.5 inches    Hip Circumference  35.5 inches    Waist to Hip Ratio  0.97 %    BMI (Calculated)  23.25    Single Leg Stand  2.88 seconds        Nutrition Therapy Plan and Nutrition Goals: Nutrition Therapy & Goals - 01/11/18 1323      Intervention Plan   Intervention  Prescribe, educate and counsel regarding individualized specific dietary modifications aiming towards targeted core components such as weight, hypertension, lipid management, diabetes, heart failure and other comorbidities.;Nutrition handout(s) given to patient.    Expected Outcomes  Short Term Goal: Understand basic principles of dietary content, such as calories, fat, sodium, cholesterol and nutrients.;Short Term Goal: A plan has been developed with personal nutrition goals set during dietitian appointment.;Long Term Goal: Adherence to prescribed nutrition plan.       Nutrition Assessments: Nutrition Assessments - 01/11/18 1030      MEDFICTS Scores   Pre Score  32       Nutrition Goals Re-Evaluation:   Nutrition Goals Discharge (Final Nutrition Goals Re-Evaluation):   Psychosocial: Target Goals: Acknowledge presence or absence of significant depression and/or stress, maximize coping skills, provide positive support system. Participant is able to verbalize types and ability to use techniques and skills needed for reducing stress and depression.   Initial Review & Psychosocial Screening: Initial Psych Review & Screening - 01/11/18 1323      Initial Review   Current issues with  Current Depression;Current Stress Concerns;Current Sleep Concerns;Current Anxiety/Panic    Source of Stress Concerns  Unable to perform yard/household activities;Unable to participate in former interests or hobbies;Poor Coping Skills;Financial    Comments  Ever since April, Maksymilian has had memory issues, unintentional weight loss (30 lbs), issues sleeping and more  anxiety. They think he possibly could have had a mini stroke, but no tests showed those results. He hasn't slept through the night since April. His family has noticed issues with his ability to process information.       Family Dynamics   Good Support System?  Yes   Wife, family, friends, Doristine Bosworth     Barriers   Psychosocial barriers to participate in program  There are no identifiable barriers or psychosocial needs.;The patient should benefit from training in stress management and relaxation.      Screening Interventions   Interventions  Encouraged to exercise;Provide feedback about the scores to participant;Program counselor consult;To provide support and resources with identified psychosocial needs    Expected Outcomes  Short Term goal: Utilizing psychosocial counselor, staff and physician to assist with identification of specific Stressors or current issues interfering with healing process. Setting desired goal for each stressor or current issue identified.;Short Term goal: Identification and review with participant of any Quality of Life or Depression concerns found by scoring the questionnaire.;Long Term Goal: Stressors or current issues are controlled or eliminated.;Long Term goal: The participant improves quality of Life and PHQ9 Scores as seen by post scores and/or verbalization of changes       Quality of Life Scores:  Quality of Life - 01/11/18 1030      Quality of Life   Select  Quality of Life      Quality of Life Scores   Health/Function Pre  19.2 %    Socioeconomic Pre  20.67 %    Psych/Spiritual Pre  20.5 %    Family Pre  28.8 %    GLOBAL Pre  21.2 %      Scores of 19 and below usually indicate a poorer quality of life in these areas.  A difference of  2-3 points is a clinically meaningful difference.  A difference of 2-3 points in the total score of the Quality of Life Index has been associated with significant improvement in overall quality of life, self-image, physical  symptoms, and general health in studies assessing change in quality of life.  PHQ-9: Recent Review Flowsheet Data    Depression screen [facility] 2/9 01/11/2018   Decreased Interest 3   Down, Depressed, Hopeless 3   PHQ - 2 Score 6   Altered sleeping 3   Tired, decreased energy 3   Change in appetite 3   Feeling bad or failure about yourself  2   Trouble concentrating 2   Moving slowly or fidgety/restless 2   Suicidal thoughts 0   PHQ-9 Score 21   Difficult doing work/chores Somewhat difficult     Interpretation of Total Score  Total Score Depression Severity:  1-4 = Minimal depression, 5-9 = Mild depression, 10-14 = Moderate depression, 15-19 = Moderately severe depression, 20-27 = Severe depression   Psychosocial Evaluation and Intervention:   Psychosocial Re-Evaluation:   Psychosocial Discharge (Final Psychosocial Re-Evaluation):   Vocational Rehabilitation: Provide vocational rehab assistance to qualifying candidates.   Vocational Rehab Evaluation & Intervention: Vocational Rehab - 01/11/18 1334      Initial Vocational Rehab Evaluation & Intervention   Assessment shows need for Vocational Rehabilitation  No       Education: Education Goals: Education classes will be provided on a variety of topics geared toward better understanding of heart health and risk factor modification. Participant will state understanding/return demonstration of topics presented as noted by education test scores.  Learning Barriers/Preferences: Learning Barriers/Preferences - 01/11/18 1333      Learning Barriers/Preferences   Learning Barriers  Exercise Concerns    Learning Preferences  Verbal Instruction;Written Material       Education Topics:  AED/CPR: - Group verbal and written instruction with the use of models to demonstrate the basic use of the AED with the basic ABC's of resuscitation.   General Nutrition Guidelines/Fats and Fiber: -Group instruction provided by verbal,  written material, models and posters to present the general guidelines for heart healthy nutrition. Gives an explanation and review of dietary fats and fiber.   Controlling Sodium/Reading Food Labels: -Group verbal and written material supporting the discussion of sodium use in heart healthy nutrition. Review and explanation with models, verbal and written materials for utilization of the food label.   Exercise Physiology & General Exercise Guidelines: - Group verbal and written instruction with models to review the exercise physiology of the cardiovascular system and associated critical values. Provides general exercise guidelines with specific guidelines to those with heart or lung disease.    Aerobic Exercise & Resistance Training: - Gives group verbal and written instruction on the various components of exercise. Focuses on aerobic and resistive training programs and the benefits of this training and how to safely progress through these programs..   Cardiac Rehab from 01/26/2018 in [facility] Cardiac and Pulmonary Rehab  Date  01/21/18  Educator  AS  Instruction Review  Code  1- Geologist, engineering, Balance, Mind/Body Relaxation: Provides group verbal/written instruction on the benefits of flexibility and balance training, including mind/body exercise modes such as yoga, pilates and tai chi.  Demonstration and skill practice provided.   Cardiac Rehab from 01/26/2018 in [facility] Cardiac and Pulmonary Rehab  Date  01/26/18  Educator  AS  Instruction Review Code  1- Verbalizes Understanding      Stress and Anxiety: - Provides group verbal and written instruction about the health risks of elevated stress and causes of high stress.  Discuss the correlation between heart/lung disease and anxiety and treatment options. Review healthy ways to manage with stress and anxiety.   Depression: - Provides group verbal and written instruction on the correlation between heart/lung  disease and depressed mood, treatment options, and the stigmas associated with seeking treatment.   Cardiac Rehab from 01/26/2018 in [facility] Cardiac and Pulmonary Rehab  Date  01/19/18  Educator  [facility]  Instruction Review Code  1- Verbalizes Understanding      Anatomy & Physiology of the Heart: - Group verbal and written instruction and models provide basic cardiac anatomy and physiology, with the coronary electrical and arterial systems. Review of Valvular disease and Heart Failure   Cardiac Procedures: - Group verbal and written instruction to review commonly prescribed medications for heart disease. Reviews the medication, class of the drug, and side effects. Includes the steps to properly store meds and maintain the prescription regimen. (beta blockers and nitrates)   Cardiac Medications I: - Group verbal and written instruction to review commonly prescribed medications for heart disease. Reviews the medication, class of the drug, and side effects. Includes the steps to properly store meds and maintain the prescription regimen.   Cardiac Medications II: -Group verbal and written instruction to review commonly prescribed medications for heart disease. Reviews the medication, class of the drug, and side effects. (all other drug classes)    Go Sex-Intimacy & Heart Disease, Get SMART - Goal Setting: - Group verbal and written instruction through game format to discuss heart disease and the return to sexual intimacy. Provides group verbal and written material to discuss and apply goal setting through the application of the S.M.A.R.T. Method.   Other Matters of the Heart: - Provides group verbal, written materials and models to describe Stable Angina and Peripheral Artery. Includes description of the disease process and treatment options available to the cardiac patient.   Exercise & Equipment Safety: - Individual verbal instruction and demonstration of equipment use and safety with use of  the equipment.   Cardiac Rehab from 01/26/2018 in [facility] Cardiac and Pulmonary Rehab  Date  01/11/18  Educator  [facility]  Instruction Review Code  1- Verbalizes Understanding      Infection Prevention: - Provides verbal and written material to individual with discussion of infection control including proper hand washing and proper equipment cleaning during exercise session.   Falls Prevention: - Provides verbal and written material to individual with discussion of falls prevention and safety.   Cardiac Rehab from 01/26/2018 in [facility] Cardiac and Pulmonary Rehab  Date  01/11/18  Educator  [facility]  Instruction Review Code  1- Verbalizes Understanding      Diabetes: - Individual verbal and written instruction to review signs/symptoms of diabetes, desired ranges of glucose level fasting, after meals and with exercise. Acknowledge that pre and post exercise glucose checks will be done for 3 sessions at entry of program.   Know Your Numbers and  Risk Factors: -Group verbal and written instruction about important numbers in your health.  Discussion of what are risk factors and how they play a role in the disease process.  Review of Cholesterol, Blood Pressure, Diabetes, and BMI and the role they play in your overall health.   Sleep Hygiene: -Provides group verbal and written instruction about how sleep can affect your health.  Define sleep hygiene, discuss sleep cycles and impact of sleep habits. Review good sleep hygiene tips.    Other: -Provides group and verbal instruction on various topics (see comments)   Knowledge Questionnaire Score: Knowledge Questionnaire Score - 01/11/18 1030      Knowledge Questionnaire Score   Pre Score  24/26   test reviewed with pt today      Core Components/Risk Factors/Patient Goals at Admission: Personal Goals and Risk Factors at Admission - 01/11/18 1320      Core Components/Risk Factors/Patient Goals on Admission    Weight Management  Yes;Weight Gain     Intervention  Weight Management: Develop a combined nutrition and exercise program designed to reach desired caloric intake, while maintaining appropriate intake of nutrient and fiber, sodium and fats, and appropriate energy expenditure required for the weight goal.;Weight Management: Provide education and appropriate resources to help participant work on and attain dietary goals.    Admit Weight  169 lb 9.6 oz (76.9 kg)    Goal Weight: Short Term  174 lb (78.9 kg)    Goal Weight: Long Term  180 lb (81.6 kg)    Expected Outcomes  Short Term: Continue to assess and modify interventions until short term weight is achieved;Long Term: Adherence to nutrition and physical activity/exercise program aimed toward attainment of established weight goal;Weight Gain: Understanding of general recommendations for a high calorie, high protein meal plan that promotes weight gain by distributing calorie intake throughout the day with the consumption for 4-5 meals, snacks, and/or supplements;Understanding of distribution of calorie intake throughout the day with the consumption of 4-5 meals/snacks;Understanding recommendations for meals to include 15-35% energy as protein, 25-35% energy from fat, 35-60% energy from carbohydrates, less than 254m of dietary cholesterol, 20-35 gm of total fiber daily    Hypertension  Yes    Intervention  Provide education on lifestyle modifcations including regular physical activity/exercise, weight management, moderate sodium restriction and increased consumption of fresh fruit, vegetables, and low fat dairy, alcohol moderation, and smoking cessation.;Monitor prescription use compliance.    Expected Outcomes  Short Term: Continued assessment and intervention until BP is < 140/935mHG in hypertensive participants. < 130/8062mG in hypertensive participants with diabetes, heart failure or chronic kidney disease.;Long Term: Maintenance of blood pressure at goal levels.    Lipids  Yes     Intervention  Provide education and support for participant on nutrition & aerobic/resistive exercise along with prescribed medications to achieve LDL <24m66mDL >40mg52m Expected Outcomes  Short Term: Participant states understanding of desired cholesterol values and is compliant with medications prescribed. Participant is following exercise prescription and nutrition guidelines.;Long Term: Cholesterol controlled with medications as prescribed, with individualized exercise RX and with personalized nutrition plan. Value goals: LDL < 24mg,40m > 40 mg.       Core Components/Risk Factors/Patient Goals Review:    Core Components/Risk Factors/Patient Goals at Discharge (Final Review):    ITP Comments: ITP Comments    Row Name 01/11/18 1316 01/27/18 0824         ITP Comments  Med review completed. Initial ITP created.  Diagnosis can be found in CHL7/9  30 day review completed. ITP sent to Dr. Ramonita Lab, covering for Dr. Emily Filbert, Medical Director of Cardiac Rehab. Continue with ITP unless changes are made by physician.  New to program         Comments: 30 day review

## 2018-01-28 ENCOUNTER — Encounter: Payer: Medicare Other | Admitting: *Deleted

## 2018-01-28 ENCOUNTER — Other Ambulatory Visit
Admission: RE | Admit: 2018-01-28 | Discharge: 2018-01-28 | Disposition: A | Discharge status: Home / Self Care | Payer: Medicare Other | Source: Ambulatory Visit | Attending: Internal Medicine | Admitting: Internal Medicine

## 2018-01-28 DIAGNOSIS — I214 Non-ST elevation (NSTEMI) myocardial infarction: Secondary | ICD-10-CM | POA: Diagnosis not present

## 2018-01-28 DIAGNOSIS — I5022 Chronic systolic (congestive) heart failure: Secondary | ICD-10-CM

## 2018-01-28 DIAGNOSIS — Z955 Presence of coronary angioplasty implant and graft: Secondary | ICD-10-CM

## 2018-01-28 DIAGNOSIS — I10 Essential (primary) hypertension: Secondary | ICD-10-CM | POA: Diagnosis present

## 2018-01-28 LAB — BASIC METABOLIC PANEL
Anion gap: 5 (ref 5–15)
BUN: 34 mg/dL — ABNORMAL HIGH (ref 8–23)
CHLORIDE: 111 mmol/L (ref 98–111)
CO2: 24 mmol/L (ref 22–32)
CREATININE: 1.81 mg/dL — AB (ref 0.61–1.24)
Calcium: 9.6 mg/dL (ref 8.9–10.3)
GFR calc non Af Amer: 36 mL/min — ABNORMAL LOW (ref >60)
GFR, EST AFRICAN AMERICAN: 41 mL/min — AB (ref >60)
Glucose, Bld: 100 mg/dL — ABNORMAL HIGH (ref 70–99)
POTASSIUM: 4.2 mmol/L (ref 3.5–5.1)
Sodium: 140 mmol/L (ref 135–145)

## 2018-01-28 NOTE — Progress Notes (Signed)
Daily Session Note  Patient Details  Name: Jesus Bolton MRN: [id number] Date of Birth: 1945/12/20 Referring Provider:     Cardiac Rehab from 01/11/2018 in [facility] Cardiac and Pulmonary Rehab  Referring Provider  End, Harrell Gave MD      Encounter Date: 01/28/2018  Check In: Session Check In - 01/28/18 0933      Check-In   Supervising physician immediately available to respond to emergencies  See telemetry face sheet for immediately available ER MD    Location  [facility]-Cardiac & Pulmonary Rehab    Staff Present  Joellyn Rued, BS, PEC;Carroll Enterkin, RN, BSN;Javarus Dorner [place], MA, RCEP, CCRP, Exercise Physiologist    Medication changes reported      No    Fall or balance concerns reported     No    Warm-up and Cool-down  Performed on first and last piece of equipment    Resistance Training Performed  Yes    VAD Patient?  No    PAD/SET Patient?  No      Pain Assessment   Currently in Pain?  No/denies          Social History   Tobacco Use  Smoking Status Never Smoker  Smokeless Tobacco Never Used    Goals Met:  Exercise tolerated well No report of cardiac concerns or symptoms Strength training completed today  Goals Unmet:  Not Applicable  Comments: Pt able to follow exercise prescription today without complaint.  Will continue to monitor for progression.    Dr. Emily Filbert is Medical Director for [place] and [facility].

## 2018-01-31 ENCOUNTER — Ambulatory Visit: Payer: Medicare Other

## 2018-02-02 ENCOUNTER — Encounter: Payer: Medicare Other | Admitting: *Deleted

## 2018-02-02 ENCOUNTER — Encounter: Payer: Self-pay | Admitting: Dietician

## 2018-02-02 DIAGNOSIS — I214 Non-ST elevation (NSTEMI) myocardial infarction: Secondary | ICD-10-CM

## 2018-02-02 DIAGNOSIS — Z955 Presence of coronary angioplasty implant and graft: Secondary | ICD-10-CM

## 2018-02-02 NOTE — Progress Notes (Signed)
Daily Session Note  Patient Details  Name: Jesus Bolton MRN: [id number] Date of Birth: 08/09/1945 Referring Provider:     Cardiac Rehab from 01/11/2018 in [facility] Cardiac and Pulmonary Rehab  Referring Provider  End, Harrell Gave MD      Encounter Date: 02/02/2018  Check In: Session Check In - 02/02/18 0940      Check-In   Supervising physician immediately available to respond to emergencies  See telemetry face sheet for immediately available ER MD    Location  [facility]-Cardiac & Pulmonary Rehab    Staff Present  Joellyn Rued, BS, PEC;Krista Greenwood, RN BSN;Jessica [place], [place], RCEP, CCRP, Exercise Physiologist    Medication changes reported      No    Fall or balance concerns reported     No    Warm-up and Cool-down  Performed on first and last piece of equipment    Resistance Training Performed  Yes    VAD Patient?  No    PAD/SET Patient?  No      Pain Assessment   Currently in Pain?  No/denies    Multiple Pain Sites  No          Social History   Tobacco Use  Smoking Status Never Smoker  Smokeless Tobacco Never Used    Goals Met:  Independence with exercise equipment Exercise tolerated well No report of cardiac concerns or symptoms Strength training completed today  Goals Unmet:  Not Applicable  Comments: Pt able to follow exercise prescription today without complaint.  Will continue to monitor for progression.    Dr. Emily Filbert is Medical Director for [place] and [facility].

## 2018-02-04 ENCOUNTER — Encounter: Payer: Medicare Other | Admitting: *Deleted

## 2018-02-04 DIAGNOSIS — I214 Non-ST elevation (NSTEMI) myocardial infarction: Secondary | ICD-10-CM | POA: Diagnosis not present

## 2018-02-04 DIAGNOSIS — Z955 Presence of coronary angioplasty implant and graft: Secondary | ICD-10-CM

## 2018-02-04 NOTE — Progress Notes (Signed)
Daily Session Note  Patient Details  Name: Jesus Bolton MRN: [id number] Date of Birth: 04/01/1946 Referring Provider:     Cardiac Rehab from 01/11/2018 in [facility] Cardiac and Pulmonary Rehab  Referring Provider  End, Christopher MD      Encounter Date: 02/04/2018  Check In: Session Check In - 02/04/18 0927      Check-In   Supervising physician immediately available to respond to emergencies  See telemetry face sheet for immediately available ER MD    Location  [facility]-Cardiac & Pulmonary Rehab    Staff Present  Mandi , BS, PEC;Carroll Enterkin, RN, BSN;Jessica Hawkins, MA, RCEP, CCRP, Exercise Physiologist;Joseph Hood RCP,RRT,BSRT    Medication changes reported      No    Fall or balance concerns reported     No    Warm-up and Cool-down  Performed on first and last piece of equipment    Resistance Training Performed  Yes    VAD Patient?  No    PAD/SET Patient?  No      Pain Assessment   Currently in Pain?  No/denies    Multiple Pain Sites  No          Social History   Tobacco Use  Smoking Status Never Smoker  Smokeless Tobacco Never Used    Goals Met:  Independence with exercise equipment Exercise tolerated well No report of cardiac concerns or symptoms Strength training completed today  Goals Unmet:  Not Applicable  Comments: Pt able to follow exercise prescription today without complaint.  Will continue to monitor for progression.    Dr. Mark Miller is Medical Director for [facility] and [facility]. 

## 2018-02-09 ENCOUNTER — Encounter: Payer: Medicare Other | Admitting: *Deleted

## 2018-02-09 DIAGNOSIS — Z955 Presence of coronary angioplasty implant and graft: Secondary | ICD-10-CM

## 2018-02-09 DIAGNOSIS — I214 Non-ST elevation (NSTEMI) myocardial infarction: Secondary | ICD-10-CM | POA: Diagnosis not present

## 2018-02-09 NOTE — Progress Notes (Signed)
Daily Session Note  Patient Details  Name: Jesus Bolton MRN: [id number] Date of Birth: [birth year] Referring Provider:     Cardiac Rehab from 01/11/2018 in [facility] Cardiac and Pulmonary Rehab  Referring Provider  End, Harrell Gave MD      Encounter Date: 02/09/2018  Check In: Session Check In - 02/09/18 0926      Check-In   Supervising physician immediately available to respond to emergencies  See telemetry face sheet for immediately available ER MD    Location  [facility]-Cardiac & Pulmonary Rehab    Staff Present  Joellyn Rued, BS, PEC;Susanne Bice, RN, BSN, CCRP;Fleeta Kunde [place], MA, RCEP, CCRP, Exercise Physiologist;Amanda Oletta Darter, BA, ACSM CEP, Exercise Physiologist    Medication changes reported      No    Fall or balance concerns reported     No    Warm-up and Cool-down  Performed on first and last piece of equipment    Resistance Training Performed  Yes    VAD Patient?  No    PAD/SET Patient?  No      Pain Assessment   Currently in Pain?  No/denies          Social History   Tobacco Use  Smoking Status Never Smoker  Smokeless Tobacco Never Used    Goals Met:  Independence with exercise equipment Exercise tolerated well No report of cardiac concerns or symptoms Strength training completed today  Goals Unmet:  Not Applicable  Comments: Pt able to follow exercise prescription today without complaint.  Will continue to monitor for progression.    Dr. Emily Filbert is Medical Director for [place] and [facility].

## 2018-02-11 ENCOUNTER — Encounter: Payer: Medicare Other | Admitting: *Deleted

## 2018-02-11 DIAGNOSIS — I214 Non-ST elevation (NSTEMI) myocardial infarction: Secondary | ICD-10-CM

## 2018-02-11 DIAGNOSIS — Z955 Presence of coronary angioplasty implant and graft: Secondary | ICD-10-CM

## 2018-02-11 NOTE — Progress Notes (Signed)
Daily Session Note  Patient Details  Name: Jesus Bolton MRN: [id number] Date of Birth: August 01, 1945 Referring Provider:     Cardiac Rehab from 01/11/2018 in [facility] Cardiac and Pulmonary Rehab  Referring Provider  End, Harrell Gave MD      Encounter Date: 02/11/2018  Check In: Session Check In - 02/11/18 0919      Check-In   Supervising physician immediately available to respond to emergencies  See telemetry face sheet for immediately available ER MD    Location  [facility]-Cardiac & Pulmonary Rehab    Staff Present  Joellyn Rued, BS, PEC;Carroll Enterkin, RN, BSN;Jessica [place], MA, RCEP, CCRP, Exercise Physiologist;Joseph Tessie Fass RCP,RRT,BSRT    Medication changes reported      No    Fall or balance concerns reported     No    Warm-up and Cool-down  Performed on first and last piece of equipment    Resistance Training Performed  Yes    VAD Patient?  No    PAD/SET Patient?  No      Pain Assessment   Currently in Pain?  No/denies    Multiple Pain Sites  No          Social History   Tobacco Use  Smoking Status Never Smoker  Smokeless Tobacco Never Used    Goals Met:  Independence with exercise equipment Exercise tolerated well Personal goals reviewed No report of cardiac concerns or symptoms Strength training completed today  Goals Unmet:  Not Applicable  Comments: Pt able to follow exercise prescription today without complaint.  Will continue to monitor for progression.    Dr. Emily Filbert is Medical Director for [place] and [facility].

## 2018-02-16 ENCOUNTER — Encounter: Payer: Medicare Other | Attending: Internal Medicine | Admitting: *Deleted

## 2018-02-16 DIAGNOSIS — Z955 Presence of coronary angioplasty implant and graft: Secondary | ICD-10-CM | POA: Diagnosis present

## 2018-02-16 DIAGNOSIS — I214 Non-ST elevation (NSTEMI) myocardial infarction: Secondary | ICD-10-CM | POA: Diagnosis not present

## 2018-02-16 NOTE — Progress Notes (Signed)
Daily Session Note  Patient Details  Name: Jesus Bolton MRN: [id number] Date of Birth: Jan 16, 1946 Referring Provider:     Cardiac Rehab from 01/11/2018 in [facility] Cardiac and Pulmonary Rehab  Referring Provider  End, Harrell Gave MD      Encounter Date: 02/16/2018  Check In: Session Check In - 02/16/18 0930      Check-In   Supervising physician immediately available to respond to emergencies  See telemetry face sheet for immediately available ER MD    Location  [facility]-Cardiac & Pulmonary Rehab    Staff Present  Nyoka Cowden, RN, BSN, Willette Pa, MA, RCEP, CCRP, Exercise Physiologist;Amanda Oletta Darter, [place], ACSM CEP, Exercise Physiologist    Medication changes reported      No    Fall or balance concerns reported     No    Warm-up and Cool-down  Performed on first and last piece of equipment    Resistance Training Performed  Yes    VAD Patient?  No    PAD/SET Patient?  No      Pain Assessment   Currently in Pain?  No/denies          Social History   Tobacco Use  Smoking Status Never Smoker  Smokeless Tobacco Never Used    Goals Met:  Independence with exercise equipment Exercise tolerated well No report of cardiac concerns or symptoms Strength training completed today  Goals Unmet:  Not Applicable  Comments: Pt able to follow exercise prescription today without complaint.  Will continue to monitor for progression.    Dr. Emily Filbert is Medical Director for [place] and [facility].

## 2018-02-23 ENCOUNTER — Encounter: Payer: Medicare Other | Admitting: *Deleted

## 2018-02-23 ENCOUNTER — Telehealth: Payer: Self-pay | Admitting: Internal Medicine

## 2018-02-23 DIAGNOSIS — I214 Non-ST elevation (NSTEMI) myocardial infarction: Secondary | ICD-10-CM | POA: Diagnosis not present

## 2018-02-23 DIAGNOSIS — Z955 Presence of coronary angioplasty implant and graft: Secondary | ICD-10-CM

## 2018-02-23 NOTE — Telephone Encounter (Signed)
Patient wife came by Pt c/o BP issue: STAT if pt c/o blurred vision, one-sided weakness or slurred speech  1. What are your last 5 BP readings? 9/10 at therapy 118/60  2. Are you having any other symptoms (ex. Dizziness, headache, blurred vision, passed out)? Losing balance, very weak  3. What is your BP issue? Would like to know if they can be seen sooner, patient has an appt with Flavia Shipper on 9/25

## 2018-02-23 NOTE — Telephone Encounter (Signed)
Patient's wife calling back. She came by earlier today by request of cardiac rehab staff to ask if patient could be seen sooner in the office. Patient is very weak and "wanting to fall back." States patient seems off balance. Patient there with wife on the phone. Patient denies dizziness, chest pain, swelling or shortness of breath. He is doing the treadmill and bike at rehab. She is concerned with patient losing about 2 lb a week. He eat 3 meals a day and stays well hydrated. His BP runs in the 130's/80's at home. He does not know what his HR runs.  Rescheduled patient's appointment for 03/01/18 with Ward Givens, NP. Patient is seeing his PCP tomorrow, 02/24/18. Advised them to discuss concerns with PCP as well and to call us back if any new questions or concerns arise.  She was appreciative and verbalized understanding.

## 2018-02-23 NOTE — Progress Notes (Signed)
Daily Session Note  Patient Details  Name: BILL YOHN MRN: [id number] Date of Birth: 01/13/1946 Referring Provider:     Cardiac Rehab from 01/11/2018 in [facility] Cardiac and Pulmonary Rehab  Referring Provider  End, Harrell Gave MD      Encounter Date: 02/23/2018  Check In: Session Check In - 02/23/18 0942      Check-In   Supervising physician immediately available to respond to emergencies  See telemetry face sheet for immediately available ER MD    Location  [facility]-Cardiac & Pulmonary Rehab    Staff Present  Nyoka Cowden, RN, BSN, Willette Pa, MA, RCEP, CCRP, Exercise Physiologist;Amanda Oletta Darter, [place], ACSM CEP, Exercise Physiologist    Medication changes reported      No    Fall or balance concerns reported     No    Warm-up and Cool-down  Performed on first and last piece of equipment    Resistance Training Performed  Yes    VAD Patient?  No    PAD/SET Patient?  No      Pain Assessment   Currently in Pain?  No/denies          Social History   Tobacco Use  Smoking Status Never Smoker  Smokeless Tobacco Never Used    Goals Met:  Independence with exercise equipment Exercise tolerated well No report of cardiac concerns or symptoms Strength training completed today  Goals Unmet:  Not Applicable  Comments: Pt able to follow exercise prescription today without complaint.  Will continue to monitor for progression.    Dr. Emily Filbert is Medical Director for [place] and [facility].

## 2018-02-23 NOTE — Telephone Encounter (Signed)
No answer. Left message to call back.   

## 2018-02-24 ENCOUNTER — Encounter: Payer: Self-pay | Admitting: *Deleted

## 2018-02-24 DIAGNOSIS — I214 Non-ST elevation (NSTEMI) myocardial infarction: Secondary | ICD-10-CM

## 2018-02-24 DIAGNOSIS — Z955 Presence of coronary angioplasty implant and graft: Secondary | ICD-10-CM

## 2018-02-24 NOTE — Progress Notes (Signed)
Cardiac Individual Treatment Plan  Patient Details  Name: Jesus Bolton MRN: [id number] Date of Birth: 1946/02/05 Referring Provider:     Cardiac Rehab from 01/11/2018 in [facility] Cardiac and Pulmonary Rehab  Referring Provider  End, Harrell Gave MD      Initial Encounter Date:    Cardiac Rehab from 01/11/2018 in [facility] Cardiac and Pulmonary Rehab  Date  01/11/18      Visit Diagnosis: NSTEMI (non-ST elevated myocardial infarction) [facility])  Status post coronary artery stent placement  Patient's Home Medications on Admission:  Current Outpatient Medications:  .  aspirin 81 MG chewable tablet, Chew 1 tablet (81 mg total) by mouth daily., Disp: 30 tablet, Rfl: 0 .  atorvastatin (LIPITOR) 80 MG tablet, Take 1 tablet (80 mg total) by mouth daily at 6 PM., Disp: 30 tablet, Rfl: 2 .  busPIRone (BUSPAR) 7.5 MG tablet, , Disp: , Rfl:  .  calcitRIOL (ROCALTROL) 0.25 MCG capsule, Take 0.25 mcg by mouth daily., Disp: , Rfl:  .  cyanocobalamin 1000 MCG tablet, Take 1,000 mcg by mouth daily., Disp: , Rfl:  .  losartan (COZAAR) 50 MG tablet, Take 1 tablet (50 mg total) by mouth daily., Disp: 90 tablet, Rfl: 3 .  metoprolol succinate (TOPROL-XL) 50 MG 24 hr tablet, Take 1 tablet (50 mg total) by mouth daily. Take with or immediately following a meal., Disp: 90 tablet, Rfl: 2 .  Multiple Vitamins-Minerals (CENTRUM SILVER 50+MEN) TABS, Take 1 tablet by mouth daily., Disp: , Rfl:  .  nitroGLYCERIN (NITROSTAT) 0.4 MG SL tablet, Place 1 tablet (0.4 mg total) under the tongue every 5 (five) minutes as needed for chest pain., Disp: 30 tablet, Rfl: 0 .  ticagrelor (BRILINTA) 90 MG TABS tablet, Take 1 tablet (90 mg total) by mouth 2 (two) times daily., Disp: 60 tablet, Rfl: 2  Past Medical History: Past Medical History:  Diagnosis Date  . BPH (benign prostatic hyperplasia)   . Elevated PSA   . Hypertension   . Indigestion   . Renal disorder   . Thyroid disease     Tobacco Use: Social History   Tobacco Use   Smoking Status Never Smoker  Smokeless Tobacco Never Used    Labs: Recent Review Scientist, physiological    Labs for ITP Cardiac and Pulmonary Rehab Latest Ref Rng & Units 12/22/2017 12/23/2017   Cholestrol 0 - 200 mg/dL - 163   LDLCALC 0 - 99 mg/dL - 95   HDL >40 mg/dL - 32(L)   Trlycerides <150 mg/dL - 179(H)   Hemoglobin A1c 4.8 - 5.6 % 5.2 -       Exercise Target Goals: Exercise Program Goal: Individual exercise prescription set using results from initial 6 min walk test and THRR while considering  patient's activity barriers and safety.   Exercise Prescription Goal: Initial exercise prescription builds to 30-45 minutes a day of aerobic activity, 2-3 days per week.  Home exercise guidelines will be given to patient during program as part of exercise prescription that the participant will acknowledge.  Activity Barriers & Risk Stratification: Activity Barriers & Cardiac Risk Stratification - 01/11/18 1336      Activity Barriers & Cardiac Risk Stratification   Activity Barriers  Arthritis;Muscular Weakness;Shortness of Breath;Deconditioning;Balance Concerns    Cardiac Risk Stratification  High       6 Minute Walk: 6 Minute Walk    Row Name 01/11/18 1448         6 Minute Walk   Phase  Initial  Distance  1325 feet     Walk Time  6 minutes     # of Rest Breaks  0     MPH  2.51     METS  3.16     RPE  13     VO2 Peak  11.04     Symptoms  No     Resting HR  83 bpm     Resting BP  128/70     Resting Oxygen Saturation   100 %     Exercise Oxygen Saturation  during 6 min walk  99 %     Max Ex. HR  92 bpm     Max Ex. BP  138/74     2 Minute Post BP  122/70        Oxygen Initial Assessment:   Oxygen Re-Evaluation:   Oxygen Discharge (Final Oxygen Re-Evaluation):   Initial Exercise Prescription: Initial Exercise Prescription - 01/11/18 1500      Date of Initial Exercise RX and Referring Provider   Date  01/11/18    Referring Provider  End, Harrell Gave MD       Treadmill   MPH  2.4    Grade  1    Minutes  15    METs  3.17      Recumbant Bike   Level  2    RPM  50    Watts  28    Minutes  15    METs  3      NuStep   Level  2    SPM  80    Minutes  15    METs  3      Prescription Details   Frequency (times per week)  2    Duration  Progress to 30 minutes of continuous aerobic without signs/symptoms of physical distress      Intensity   THRR 40-80% of Max Heartrate  109-135    Ratings of Perceived Exertion  11-13    Perceived Dyspnea  0-4      Progression   Progression  Continue to progress workloads to maintain intensity without signs/symptoms of physical distress.      Resistance Training   Training Prescription  Yes    Weight  3 lbs    Reps  10-15       Perform Capillary Blood Glucose checks as needed.  Exercise Prescription Changes: Exercise Prescription Changes    Row Name 01/11/18 1300 01/19/18 1100 01/26/18 1000 02/02/18 1100 02/16/18 1600     Response to Exercise   Blood Pressure (Admit)  128/70  132/64  -  130/70  98/50   Blood Pressure (Exercise)  138/74  164/78  -  152/70  126/66   Blood Pressure (Exit)  122/70  122/72  -  122/56  112/64   Heart Rate (Admit)  83 bpm  73 bpm  -  130 bpm  76 bpm   Heart Rate (Exercise)  92 bpm  145 bpm  -  130 bpm  108 bpm   Heart Rate (Exit)  82 bpm  104 bpm  -  89 bpm  75 bpm   Oxygen Saturation (Admit)  100 %  -  -  -  -   Oxygen Saturation (Exercise)  99 %  -  -  -  -   Rating of Perceived Exertion (Exercise)  13  15  -  13  13   Symptoms  walk test results  none  -  none  none   Comments  -  first full day of exercise   -  -  -   Duration  -  Continue with 30 min of aerobic exercise without signs/symptoms of physical distress.  -  Continue with 30 min of aerobic exercise without signs/symptoms of physical distress.  Continue with 30 min of aerobic exercise without signs/symptoms of physical distress.   Intensity  -  THRR unchanged  -  THRR unchanged  THRR unchanged      Progression   Progression  -  Continue to progress workloads to maintain intensity without signs/symptoms of physical distress.  -  Continue to progress workloads to maintain intensity without signs/symptoms of physical distress.  Continue to progress workloads to maintain intensity without signs/symptoms of physical distress.   Average METs  -  2.79  -  2.79  3.08     Resistance Training   Training Prescription  -  Yes  -  Yes  Yes   Weight  -  3 lbs  -  3 lbs  3 lbs   Reps  -  10-15  -  10-15  10-15     Interval Training   Interval Training  -  -  -  -  No     Treadmill   MPH  -  1.6  -  1.6  2   Grade  -  1  -  1  1   Minutes  -  15  -  15  15   METs  -  2.45  -  2.45  2.81     Recumbant Bike   Level  -  2  -  -  2   Watts  -  28  -  -  26   Minutes  -  15  -  -  15   METs  -  3.14  -  -  3.05     NuStep   Level  -  -  -  2  2   Minutes  -  -  -  15  15   METs  -  -  -  3  3.4     Home Exercise Plan   Plans to continue exercise at  -  -  Home (comment) walking  Home (comment)  Home (comment)   Frequency  -  -  Add 3 additional days to program exercise sessions.  Add 3 additional days to program exercise sessions.  Add 3 additional days to program exercise sessions.   Initial Home Exercises Provided  -  -  01/26/18  01/26/18  01/26/18      Exercise Comments: Exercise Comments    Row Name 01/19/18 0940           Exercise Comments  First full day of exercise!  Patient was oriented to gym and equipment including functions, settings, policies, and procedures.  Patient's individual exercise prescription and treatment plan were reviewed.  All starting workloads were established based on the results of the 6 minute walk test done at initial orientation visit.  The plan for exercise progression was also introduced and progression will be customized based on patient's performance and goals.          Exercise Goals and Review: Exercise Goals    Row Name 01/11/18 1504              Exercise Goals   Increase Physical Activity  Yes  Intervention  Provide advice, education, support and counseling about physical activity/exercise needs.;Develop an individualized exercise prescription for aerobic and resistive training based on initial evaluation findings, risk stratification, comorbidities and participant's personal goals.       Expected Outcomes  Short Term: Attend rehab on a regular basis to increase amount of physical activity.;Long Term: Add in home exercise to make exercise part of routine and to increase amount of physical activity.;Long Term: Exercising regularly at least 3-5 days a week.       Increase Strength and Stamina  Yes       Intervention  Provide advice, education, support and counseling about physical activity/exercise needs.;Develop an individualized exercise prescription for aerobic and resistive training based on initial evaluation findings, risk stratification, comorbidities and participant's personal goals.       Expected Outcomes  Short Term: Increase workloads from initial exercise prescription for resistance, speed, and METs.;Short Term: Perform resistance training exercises routinely during rehab and add in resistance training at home;Long Term: Improve cardiorespiratory fitness, muscular endurance and strength as measured by increased METs and functional capacity (6MWT)       Able to understand and use rate of perceived exertion (RPE) scale  Yes       Intervention  Provide education and explanation on how to use RPE scale       Expected Outcomes  Short Term: Able to use RPE daily in rehab to express subjective intensity level;Long Term:  Able to use RPE to guide intensity level when exercising independently       Knowledge and understanding of Target Heart Rate Range (THRR)  Yes       Intervention  Provide education and explanation of THRR including how the numbers were predicted and where they are located for reference       Expected Outcomes   Short Term: Able to state/look up THRR;Short Term: Able to use daily as guideline for intensity in rehab;Long Term: Able to use THRR to govern intensity when exercising independently       Able to check pulse independently  Yes       Intervention  Provide education and demonstration on how to check pulse in carotid and radial arteries.;Review the importance of being able to check your own pulse for safety during independent exercise       Expected Outcomes  Short Term: Able to explain why pulse checking is important during independent exercise;Long Term: Able to check pulse independently and accurately       Understanding of Exercise Prescription  Yes       Intervention  Provide education, explanation, and written materials on patient's individual exercise prescription       Expected Outcomes  Short Term: Able to explain program exercise prescription;Long Term: Able to explain home exercise prescription to exercise independently          Exercise Goals Re-Evaluation : Exercise Goals Re-Evaluation    Row Name 01/19/18 0940 01/26/18 1044 02/02/18 1158 02/11/18 0947 02/16/18 1623     Exercise Goal Re-Evaluation   Exercise Goals Review  Increase Physical Activity;Able to understand and use rate of perceived exertion (RPE) scale;Knowledge and understanding of Target Heart Rate Range (THRR);Understanding of Exercise Prescription;Increase Strength and Stamina  Increase Physical Activity;Able to understand and use rate of perceived exertion (RPE) scale;Knowledge and understanding of Target Heart Rate Range (THRR);Understanding of Exercise Prescription;Increase Strength and Stamina  Increase Physical Activity;Able to understand and use rate of perceived exertion (RPE) scale;Knowledge and understanding of Target Heart  Rate Range (THRR);Understanding of Exercise Prescription;Increase Strength and Stamina  Increase Physical Activity;Increase Strength and Stamina;Understanding of Exercise Prescription  Increase  Physical Activity;Increase Strength and Stamina;Understanding of Exercise Prescription   Comments  Reviewed RPE scale, THR and program prescription with pt today.  Pt voiced understanding and was given a copy of goals to take home.  Reviewed home exercise with pt today.  Pt plans to walk outside at home for exercise.  Reviewed THR, pulse, RPE, sign and symptoms, NTG use, and when to call 911 or MD.  Also discussed weather considerations and indoor options.  Pt voiced understanding.  Williom is doing well in rehab. He is getting stronger and becoming more comfortable with exercise. We will continue to monitor progress and increase workloads as tolerated.   Dondrell is doing well in exercise.  He is walking for 15mn  and doing weights at home for exercise. Talked about expanding time to 30 min of walking.   His strength and stamina have been about the same.    HEveradohas been doing well in rehab.  He up to 2.0 mph now on the treadmill.  We will continue to monitor his progress.    Expected Outcomes  Short: Use RPE daily to regulate intensity. Long: Follow program prescription in THR.  Short: add an additonal 3 days of exercise at home. Long: become comfortable exercising on his own   Short: continue to attend rehab regularly Long: increase overall MET level   Short: Increase to 337m walking at home. 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  Long: Continue to rebuild strength, stamina, and confidence back.       Discharge Exercise Prescription (Final Exercise Prescription Changes): Exercise Prescription Changes - 02/16/18 1600      Response to Exercise   Blood Pressure (Admit)  98/50    Blood Pressure (Exercise)  126/66    Blood Pressure (Exit)  112/64    Heart Rate (Admit)  76 bpm    Heart Rate (Exercise)  108 bpm    Heart Rate (Exit)  75 bpm    Rating of Perceived Exertion (Exercise)  13    Symptoms  none    Duration  Continue with 30 min of  aerobic exercise without signs/symptoms of physical distress.    Intensity  THRR unchanged      Progression   Progression  Continue to progress workloads to maintain intensity without signs/symptoms of physical distress.    Average METs  3.08      Resistance Training   Training Prescription  Yes    Weight  3 lbs    Reps  10-15      Interval Training   Interval Training  No      Treadmill   MPH  2    Grade  1    Minutes  15    METs  2.81      Recumbant Bike   Level  2    Watts  26    Minutes  15    METs  3.05      NuStep   Level  2    Minutes  15    METs  3.4      Home Exercise Plan   Plans to continue exercise at  Home (comment)    Frequency  Add 3 additional days to program exercise sessions.    Initial Home Exercises Provided  01/26/18  Nutrition:  Target Goals: Understanding of nutrition guidelines, daily intake of sodium '1500mg'$ , cholesterol '200mg'$ , calories 30% from fat and 7% or less from saturated fats, daily to have 5 or more servings of fruits and vegetables.  Biometrics: Pre Biometrics - 01/11/18 1504      Pre Biometrics   Height  5' 11.6" (1.819 m)    Weight  169 lb 9.6 oz (76.9 kg)    Waist Circumference  34.5 inches    Hip Circumference  35.5 inches    Waist to Hip Ratio  0.97 %    BMI (Calculated)  23.25    Single Leg Stand  2.88 seconds        Nutrition Therapy Plan and Nutrition Goals: Nutrition Therapy & Goals - 02/02/18 1137      Nutrition Therapy   Diet  TLC    Drug/Food Interactions  Statins/Certain Fruits    Protein (specify units)  9oz    Fiber  35 grams    Whole Grain Foods  3 servings   typically eats white/wheat or [place] bread   Saturated Fats  16 max. grams    Fruits and Vegetables  6 servings/day   8 ideal, working to increase fruit and vegetable intake   Sodium  1500 grams      Personal Nutrition Goals   Nutrition Goal  Continue to work on eating less fried foods, less full-fat dairy, more fish and more fruits  & vegetables    Personal Goal #2  Start to become familiar with reading nutrition facts labels, paying particular attention to sodium, total fat, types of fat and added sugar    Personal Goal #3  Great job with starting to eat breakfast! Continue this practice even when you are no longer attending our classes    Personal Goal #4  Swap your sweets; choose fruit rather than cookies as snacks more often    Comments  He has made several changes to his dietary habits since having a heart attack. Lunch is his largest meal of the day. He has lost approx 30# in 3 months and would like to gain some weight      Intervention Plan   Intervention  Prescribe, educate and counsel regarding individualized specific dietary modifications aiming towards targeted core components such as weight, hypertension, lipid management, diabetes, heart failure and other comorbidities.;Nutrition handout(s) given to patient.   gave heart healthy eating handout   Expected Outcomes  Short Term Goal: Understand basic principles of dietary content, such as calories, fat, sodium, cholesterol and nutrients.;Long Term Goal: Adherence to prescribed nutrition plan.;Short Term Goal: A plan has been developed with personal nutrition goals set during dietitian appointment.       Nutrition Assessments: Nutrition Assessments - 01/11/18 1030      MEDFICTS Scores   Pre Score  32       Nutrition Goals Re-Evaluation: Nutrition Goals Re-Evaluation    [place] Name 02/02/18 1201 02/11/18 0952           Goals   Nutrition Goal  Continue to work on eating less fried foods, less full-fat dairy, more fish and more fruits & vegetables  Less fried food, lower fat dairy, more fish, fruits and vegetables, reading labels.       Comment  He has made several changes to his dietary habits s/p heart attack. He has purchased a [employer] to [employer] by methods other than frying and chooses lower fat milk as well. He has started to swap  some sweets  for fruits as snacks  He has cut back on fried foods and are cooking differently.  They are trying to eat more fish and leaner meats.  He has cut back on fat and watching sodium. He is reading his food labels a little more closely now.  He has started to add in more fruits and vegetables now.       Expected Outcome  He will eat a diet that includes low fat dairy, non-fried lean meats, and regular consumption of fruits and vegetables  Short: Continue to work on alternative cooking methods versus frying.  Long: Continue to follow heart healthy guidelines.         Personal Goal #2 Re-Evaluation   Personal Goal #2  Start to become familiar with reading nutrition facts labels, paying particular attention to sodium, total fat, types of fat and added sugar He has been trying to eat less salt but has not yet started reading food labels  -        Personal Goal #3 Re-Evaluation   Personal Goal #3  Great job with starting to eat breakfast! Continue this practice even when you are no longer attending our classes  -        Personal Goal #4 Re-Evaluation   Personal Goal #4  Swap your sweets; choose fruit rather than cookes as snacks more often  -         Nutrition Goals Discharge (Final Nutrition Goals Re-Evaluation): Nutrition Goals Re-Evaluation - 02/11/18 6063      Goals   Nutrition Goal  Less fried food, lower fat dairy, more fish, fruits and vegetables, reading labels.     Comment  He has cut back on fried foods and are cooking differently.  They are trying to eat more fish and leaner meats.  He has cut back on fat and watching sodium. He is reading his food labels a little more closely now.  He has started to add in more fruits and vegetables now.     Expected Outcome  Short: Continue to work on alternative cooking methods versus frying.  Long: Continue to follow heart healthy guidelines.        Psychosocial: Target Goals: Acknowledge presence or absence of significant depression and/or stress,  maximize coping skills, provide positive support system. Participant is able to verbalize types and ability to use techniques and skills needed for reducing stress and depression.   Initial Review & Psychosocial Screening: Initial Psych Review & Screening - 01/11/18 1323      Initial Review   Current issues with  Current Depression;Current Stress Concerns;Current Sleep Concerns;Current Anxiety/Panic    Source of Stress Concerns  Unable to perform yard/household activities;Unable to participate in former interests or hobbies;Poor Coping Skills;Financial    Comments  Ever since April, Ioannis has had memory issues, unintentional weight loss (30 lbs), issues sleeping and more anxiety. They think he possibly could have had a mini stroke, but no tests showed those results. He hasn't slept through the night since April. His family has noticed issues with his ability to process information.       Family Dynamics   Good Support System?  Yes   Wife, family, friends, Doristine Bosworth     Barriers   Psychosocial barriers to participate in program  There are no identifiable barriers or psychosocial needs.;The patient should benefit from training in stress management and relaxation.      Screening Interventions   Interventions  Encouraged to exercise;Provide feedback about the  scores to participant;Program counselor consult;To provide support and resources with identified psychosocial needs    Expected Outcomes  Short Term goal: Utilizing psychosocial counselor, staff and physician to assist with identification of specific Stressors or current issues interfering with healing process. Setting desired goal for each stressor or current issue identified.;Short Term goal: Identification and review with participant of any Quality of Life or Depression concerns found by scoring the questionnaire.;Long Term Goal: Stressors or current issues are controlled or eliminated.;Long Term goal: The participant improves quality of Life  and PHQ9 Scores as seen by post scores and/or verbalization of changes       Quality of Life Scores:  Quality of Life - 01/11/18 1030      Quality of Life   Select  Quality of Life      Quality of Life Scores   Health/Function Pre  19.2 %    Socioeconomic Pre  20.67 %    Psych/Spiritual Pre  20.5 %    Family Pre  28.8 %    GLOBAL Pre  21.2 %      Scores of 19 and below usually indicate a poorer quality of life in these areas.  A difference of  2-3 points is a clinically meaningful difference.  A difference of 2-3 points in the total score of the Quality of Life Index has been associated with significant improvement in overall quality of life, self-image, physical symptoms, and general health in studies assessing change in quality of life.  PHQ-9: Recent Review Flowsheet Data    Depression screen [facility] 2/9 02/09/2018 01/11/2018   Decreased Interest 2 3   Down, Depressed, Hopeless 2 3   PHQ - 2 Score 4 6   Altered sleeping 2 3   Tired, decreased energy 2 3   Change in appetite 2 3   Feeling bad or failure about yourself  1 2   Trouble concentrating 1 2   Moving slowly or fidgety/restless 2 2   Suicidal thoughts 0 0   PHQ-9 Score 14 21   Difficult doing work/chores Somewhat difficult Somewhat difficult     Interpretation of Total Score  Total Score Depression Severity:  1-4 = Minimal depression, 5-9 = Mild depression, 10-14 = Moderate depression, 15-19 = Moderately severe depression, 20-27 = Severe depression   Psychosocial Evaluation and Intervention: Psychosocial Evaluation - 01/28/18 1004      Psychosocial Evaluation & Interventions   Interventions  Relaxation education;Stress management education;Encouraged to exercise with the program and follow exercise prescription    Comments  Counselor met with Mr. Elza [facility]) today for initial psychosocial evaluation.  He is a 72 year old who had a heart attack and stent inserted in July.  He has a strong support system with a  spouse of 62 years; (3) adult sons; and a sister-in-law who live locally.  Tesean has stage 3 kidney disease as well as his heart condition.  He reports not sleeping well since the spring and his Dr. just ordered an increase in one of his medications to begin today for this.  He reports a limited appetite for the past several years.  Abe People denies a history of depression or anxiety but admits he has some depressive symptoms once he attended the psychoeducational component on this topic and realized typical symptoms.  His PHQ-9 score is a "21" indicating severe symptoms.  Counselor mentioned seeing a Dr. or counselor about this and Malikah was hesitant to consider this without speaking with his spouse and also due  to financial reasons.  Quenten reports his primary stressors are that he cannot do normal activities; he is selling his house and moving soon; finances; his health and generally feeling "overwhelmed."  Jayleon has goals to increase his stamina and strength and to be more educated on healthy diet and exercise while in this program.  Counselor will follow with him on sleep and depressive symptoms.    Expected Outcomes  Short:  Lash will begin the increased medication today for sleep in hopes that it will help with this situation.  He will decrease length naps and caffeine during the day as discussed.  Audi will attend and exercise consistently for his health and mental health/coping strategies.  Long:  Taishawn will develop a routine of positive self-care;including exercise and stress management strategies.    Continue Psychosocial Services   Follow up required by counselor       Psychosocial Re-Evaluation: Psychosocial Re-Evaluation    [place] Name 02/11/18 0949 02/16/18 1108           Psychosocial Re-Evaluation   Current issues with  Current Sleep Concerns;Current Stress Concerns  Current Psychotropic Meds;Current Sleep Concerns;Current Stress Concerns      Comments  Gaetan is doing well in rehab.   He has changed his meds for sleeping and he is sleeping better.  He has been able to cut back on his naps and not napping after 3pm.   He has been exercsining at home.  He continues to still have a foggy feeling and thinks it may be related to some of his medications.  He is trying to work on his self care.   Counselor follow up with Mr. & Ms. Dosanjh today (met individually).  Ms. Taniguchi is concerned about her spouse as he is not sleeping; is more irritable; is more anxious; has unintentional weight loss with no appetite and is becoming more difficult for her to "calm 'him' down.  They are in the process of moving out of a house and into an apartment and this is very stressful as well.  Ms. Simonetti reports Ethon is "obsessing" over every little thing and is unable to relax.  She reports he is supposed to be taking medication for sleep/anxiety prescribed by his Dr, but is refusing to do as directed.  Counselor met with Mr. Rumery after providing an education class on sleep hygiene.  He reports ongoing problems with sleep even after decreasing his caffeine intake - and recognizes he is sleeping too much during the day (napping).  He admits to not taking his medications as directed and agreed to do so for the next 9 days until counselor meets with him again.  Ms. Longmore is also trying to move up the sleep study from later this month to sooner- if possible.   Counselor will follow with Mr. Devonshire and his spouse.      Expected Outcomes  Short: Continue to work on sleep habits.  Long: Continue to practice positive self care.   Short:  Eran will take his medications as directed by Dr. for the next 9 days to see if his sleep and mood improve.  He will call his Dr. if there are severe negative side effects.  Ms. Tory will try to get the sleep study scheduled more quickly.  Counselor will follow with him and Ms. Wiederholt.  Long:  Practice positive self care and stress management - as well as sleep hygiene.      Interventions  Encouraged  to attend Cardiac Rehabilitation  for the exercise;Stress management education  -      Continue Psychosocial Services   Follow up required by staff  -      Comments  Ever since April, Savvas has had memory issues, unintentional weight loss (30 lbs), issues sleeping and more anxiety. They think he possibly could have had a mini stroke, but no tests showed those results. He hasn't slept through the night since April. His family has noticed issues with his ability to process information.   -        Initial Review   Source of Stress Concerns  Unable to perform yard/household activities;Unable to participate in former interests or hobbies;Poor Coping Skills;Financial  -         Psychosocial Discharge (Final Psychosocial Re-Evaluation): Psychosocial Re-Evaluation - 02/16/18 1108      Psychosocial Re-Evaluation   Current issues with  Current Psychotropic Meds;Current Sleep Concerns;Current Stress Concerns    Comments  Counselor follow up with Mr. & Ms. Yearwood today (met individually).  Ms. Stead is concerned about her spouse as he is not sleeping; is more irritable; is more anxious; has unintentional weight loss with no appetite and is becoming more difficult for her to "calm 'him' down.  They are in the process of moving out of a house and into an apartment and this is very stressful as well.  Ms. Donatelli reports Aithan is "obsessing" over every little thing and is unable to relax.  She reports he is supposed to be taking medication for sleep/anxiety prescribed by his Dr, but is refusing to do as directed.  Counselor met with Mr. Rutigliano after providing an education class on sleep hygiene.  He reports ongoing problems with sleep even after decreasing his caffeine intake - and recognizes he is sleeping too much during the day (napping).  He admits to not taking his medications as directed and agreed to do so for the next 9 days until counselor meets with him again.  Ms. Stockinger is also trying to move up the sleep study  from later this month to sooner- if possible.   Counselor will follow with Mr. Hartline and his spouse.    Expected Outcomes  Short:  Gil will take his medications as directed by Dr. for the next 9 days to see if his sleep and mood improve.  He will call his Dr. if there are severe negative side effects.  Ms. Tobon will try to get the sleep study scheduled more quickly.  Counselor will follow with him and Ms. Coy.  Long:  Practice positive self care and stress management - as well as sleep hygiene.       Vocational Rehabilitation: Provide vocational rehab assistance to qualifying candidates.   Vocational Rehab Evaluation & Intervention: Vocational Rehab - 01/11/18 1334      Initial Vocational Rehab Evaluation & Intervention   Assessment shows need for Vocational Rehabilitation  No       Education: Education Goals: Education classes will be provided on a variety of topics geared toward better understanding of heart health and risk factor modification. Participant will state understanding/return demonstration of topics presented as noted by education test scores.  Learning Barriers/Preferences: Learning Barriers/Preferences - 01/11/18 1333      Learning Barriers/Preferences   Learning Barriers  Exercise Concerns    Learning Preferences  Verbal Instruction;Written Material       Education Topics:  AED/CPR: - Group verbal and written instruction with the use of models to demonstrate the basic use of  the AED with the basic ABC's of resuscitation.   General Nutrition Guidelines/Fats and Fiber: -Group instruction provided by verbal, written material, models and posters to present the general guidelines for heart healthy nutrition. Gives an explanation and review of dietary fats and fiber.   Cardiac Rehab from 02/23/2018 in [facility] Cardiac and Pulmonary Rehab  Date  02/23/18  Educator  LB  Instruction Review Code  1- Verbalizes Understanding      Controlling Sodium/Reading Food  Labels: -Group verbal and written material supporting the discussion of sodium use in heart healthy nutrition. Review and explanation with models, verbal and written materials for utilization of the food label.   Exercise Physiology & General Exercise Guidelines: - Group verbal and written instruction with models to review the exercise physiology of the cardiovascular system and associated critical values. Provides general exercise guidelines with specific guidelines to those with heart or lung disease.    Aerobic Exercise & Resistance Training: - Gives group verbal and written instruction on the various components of exercise. Focuses on aerobic and resistive training programs and the benefits of this training and how to safely progress through these programs..   Cardiac Rehab from 02/23/2018 in [facility] Cardiac and Pulmonary Rehab  Date  01/21/18  Educator  AS  Instruction Review Code  1- Verbalizes Understanding      Flexibility, Balance, Mind/Body Relaxation: Provides group verbal/written instruction on the benefits of flexibility and balance training, including mind/body exercise modes such as yoga, pilates and tai chi.  Demonstration and skill practice provided.   Cardiac Rehab from 02/23/2018 in [facility] Cardiac and Pulmonary Rehab  Date  01/26/18  Educator  AS  Instruction Review Code  1- Verbalizes Understanding      Stress and Anxiety: - Provides group verbal and written instruction about the health risks of elevated stress and causes of high stress.  Discuss the correlation between heart/lung disease and anxiety and treatment options. Review healthy ways to manage with stress and anxiety.   Cardiac Rehab from 02/23/2018 in [facility] Cardiac and Pulmonary Rehab  Date  02/02/18  Educator  [facility]  Instruction Review Code  1- Verbalizes Understanding      Depression: - Provides group verbal and written instruction on the correlation between heart/lung disease and depressed mood, treatment  options, and the stigmas associated with seeking treatment.   Cardiac Rehab from 02/23/2018 in [facility] Cardiac and Pulmonary Rehab  Date  01/19/18  Educator  [facility]  Instruction Review Code  1- Verbalizes Understanding      Anatomy & Physiology of the Heart: - Group verbal and written instruction and models provide basic cardiac anatomy and physiology, with the coronary electrical and arterial systems. Review of Valvular disease and Heart Failure   Cardiac Rehab from 02/23/2018 in [facility] Cardiac and Pulmonary Rehab  Date  02/04/18  Educator  CE  Instruction Review Code  1- Verbalizes Understanding      Cardiac Procedures: - Group verbal and written instruction to review commonly prescribed medications for heart disease. Reviews the medication, class of the drug, and side effects. Includes the steps to properly store meds and maintain the prescription regimen. (beta blockers and nitrates)   Cardiac Medications I: - Group verbal and written instruction to review commonly prescribed medications for heart disease. Reviews the medication, class of the drug, and side effects. Includes the steps to properly store meds and maintain the prescription regimen.   Cardiac Rehab from 02/23/2018 in [facility] Cardiac and Pulmonary Rehab  Date  02/09/18  Educator  SB  Instruction Review Code  1- Verbalizes Understanding      Cardiac Medications II: -Group verbal and written instruction to review commonly prescribed medications for heart disease. Reviews the medication, class of the drug, and side effects. (all other drug classes)   Cardiac Rehab from 02/23/2018 in [facility] Cardiac and Pulmonary Rehab  Date  01/28/18  Educator  CE  Instruction Review Code  1- Verbalizes Understanding       Go Sex-Intimacy & Heart Disease, Get SMART - Goal Setting: - Group verbal and written instruction through game format to discuss heart disease and the return to sexual intimacy. Provides group verbal and written material to  discuss and apply goal setting through the application of the S.M.A.R.T. Method.   Other Matters of the Heart: - Provides group verbal, written materials and models to describe Stable Angina and Peripheral Artery. Includes description of the disease process and treatment options available to the cardiac patient.   Cardiac Rehab from 02/23/2018 in [facility] Cardiac and Pulmonary Rehab  Date  02/04/18  Educator  CE  Instruction Review Code  1- Verbalizes Understanding      Exercise & Equipment Safety: - Individual verbal instruction and demonstration of equipment use and safety with use of the equipment.   Cardiac Rehab from 02/23/2018 in [facility] Cardiac and Pulmonary Rehab  Date  01/11/18  Educator  [facility]  Instruction Review Code  1- Verbalizes Understanding      Infection Prevention: - Provides verbal and written material to individual with discussion of infection control including proper hand washing and proper equipment cleaning during exercise session.   Falls Prevention: - Provides verbal and written material to individual with discussion of falls prevention and safety.   Cardiac Rehab from 02/23/2018 in [facility] Cardiac and Pulmonary Rehab  Date  01/11/18  Educator  [facility]  Instruction Review Code  1- Verbalizes Understanding      Diabetes: - Individual verbal and written instruction to review signs/symptoms of diabetes, desired ranges of glucose level fasting, after meals and with exercise. Acknowledge that pre and post exercise glucose checks will be done for 3 sessions at entry of program.   Know Your Numbers and Risk Factors: -Group verbal and written instruction about important numbers in your health.  Discussion of what are risk factors and how they play a role in the disease process.  Review of Cholesterol, Blood Pressure, Diabetes, and BMI and the role they play in your overall health.   Cardiac Rehab from 02/23/2018 in [facility] Cardiac and Pulmonary Rehab  Date  01/28/18  Educator  CE   Instruction Review Code  1- Verbalizes Understanding      Sleep Hygiene: -Provides group verbal and written instruction about how sleep can affect your health.  Define sleep hygiene, discuss sleep cycles and impact of sleep habits. Review good sleep hygiene tips.    Cardiac Rehab from 02/23/2018 in [facility] Cardiac and Pulmonary Rehab  Date  02/16/18  Educator  [facility]  Instruction Review Code  1- Verbalizes Understanding      Other: -Provides group and verbal instruction on various topics (see comments)   Knowledge Questionnaire Score: Knowledge Questionnaire Score - 01/11/18 1030      Knowledge Questionnaire Score   Pre Score  24/26   test reviewed with pt today      Core Components/Risk Factors/Patient Goals at Admission: Personal Goals and Risk Factors at Admission - 01/11/18 1320      Core Components/Risk Factors/Patient Goals on Admission  Weight Management  Yes;Weight Gain    Intervention  Weight Management: Develop a combined nutrition and exercise program designed to reach desired caloric intake, while maintaining appropriate intake of nutrient and fiber, sodium and fats, and appropriate energy expenditure required for the weight goal.;Weight Management: Provide education and appropriate resources to help participant work on and attain dietary goals.    Admit Weight  169 lb 9.6 oz (76.9 kg)    Goal Weight: Short Term  174 lb (78.9 kg)    Goal Weight: Long Term  180 lb (81.6 kg)    Expected Outcomes  Short Term: Continue to assess and modify interventions until short term weight is achieved;Long Term: Adherence to nutrition and physical activity/exercise program aimed toward attainment of established weight goal;Weight Gain: Understanding of general recommendations for a high calorie, high protein meal plan that promotes weight gain by distributing calorie intake throughout the day with the consumption for 4-5 meals, snacks, and/or supplements;Understanding of distribution of  calorie intake throughout the day with the consumption of 4-5 meals/snacks;Understanding recommendations for meals to include 15-35% energy as protein, 25-35% energy from fat, 35-60% energy from carbohydrates, less than '200mg'$  of dietary cholesterol, 20-35 gm of total fiber daily    Hypertension  Yes    Intervention  Provide education on lifestyle modifcations including regular physical activity/exercise, weight management, moderate sodium restriction and increased consumption of fresh fruit, vegetables, and low fat dairy, alcohol moderation, and smoking cessation.;Monitor prescription use compliance.    Expected Outcomes  Short Term: Continued assessment and intervention until BP is < 140/64m HG in hypertensive participants. < 130/853mHG in hypertensive participants with diabetes, heart failure or chronic kidney disease.;Long Term: Maintenance of blood pressure at goal levels.    Lipids  Yes    Intervention  Provide education and support for participant on nutrition & aerobic/resistive exercise along with prescribed medications to achieve LDL '70mg'$ , HDL >'40mg'$ .    Expected Outcomes  Short Term: Participant states understanding of desired cholesterol values and is compliant with medications prescribed. Participant is following exercise prescription and nutrition guidelines.;Long Term: Cholesterol controlled with medications as prescribed, with individualized exercise RX and with personalized nutrition plan. Value goals: LDL < '70mg'$ , HDL > 40 mg.       Core Components/Risk Factors/Patient Goals Review:  Goals and Risk Factor Review    Row Name 02/11/18 0954             Core Components/Risk Factors/Patient Goals Review   Personal Goals Review  Weight Management/Obesity;Hypertension;Lipids       Review  Dyllin's weight has remained steady.  He still wants to work on a gaining a little.  He is lifting weights and exercising. We talked making sure he is getting enough protein in his diet.  His blood  pressures have been good in class and he is checking them at home.  His reading at home are a little higher in the evening, which he checks abour 2-3x week.  He is also monitoring his kidney functions.  He feels that his medications are working well for him.        Expected Outcomes  Short: Continue to work on gaining weight.  Long: Continue to monitor risk factors.           Core Components/Risk Factors/Patient Goals at Discharge (Final Review):  Goals and Risk Factor Review - 02/11/18 0954      Core Components/Risk Factors/Patient Goals Review   Personal Goals Review  Weight Management/Obesity;Hypertension;Lipids    Review  Broadus's weight has remained steady.  He still wants to work on a gaining a little.  He is lifting weights and exercising. We talked making sure he is getting enough protein in his diet.  His blood pressures have been good in class and he is checking them at home.  His reading at home are a little higher in the evening, which he checks abour 2-3x week.  He is also monitoring his kidney functions.  He feels that his medications are working well for him.     Expected Outcomes  Short: Continue to work on gaining weight.  Long: Continue to monitor risk factors.        ITP Comments: ITP Comments    Row Name 01/11/18 1316 01/27/18 0824 02/24/18 0602       ITP Comments  Med review completed. Initial ITP created. Diagnosis can be found in CHL7/9  30 day review completed. ITP sent to Dr. Ramonita Lab, covering for Dr. Emily Filbert, Medical Director of Cardiac Rehab. Continue with ITP unless changes are made by physician.  New to program  30 day review completed. ITP sent to Dr. Emily Filbert, Medical Director of Cardiac Rehab. Continue with ITP unless changes are made by physician        Comments:

## 2018-02-25 ENCOUNTER — Encounter: Payer: Medicare Other | Admitting: *Deleted

## 2018-02-25 DIAGNOSIS — Z955 Presence of coronary angioplasty implant and graft: Secondary | ICD-10-CM

## 2018-02-25 DIAGNOSIS — I214 Non-ST elevation (NSTEMI) myocardial infarction: Secondary | ICD-10-CM | POA: Diagnosis not present

## 2018-02-25 NOTE — Progress Notes (Signed)
Daily Session Note  Patient Details  Name: Jesus Bolton MRN: [id number] Date of Birth: [birth year] Referring Provider:     Cardiac Rehab from 01/11/2018 in [facility] Cardiac and Pulmonary Rehab  Referring Provider  End, Harrell Gave MD      Encounter Date: 02/25/2018  Check In: Session Check In - 02/25/18 0948      Check-In   Supervising physician immediately available to respond to emergencies  See telemetry face sheet for immediately available ER MD    Location  [facility]-Cardiac & Pulmonary Rehab    Staff Present  Alberteen Sam, MA, RCEP, CCRP, Exercise Physiologist;Joseph Hood RCP,RRT,BSRT;Carroll Enterkin, RN, Geralyn Corwin, RN BSN    Medication changes reported      --   Jesus Bolton states his metoprolol was decreased from 72m to 239myesterday by MD.   Jesus Bolton balance concerns reported     Yes    Comments  His wife asked that we work on and discuss some hom excercises for balance per his doctors request. He tencs to leans backwards when standing and will get off balance easily.     Warm-up and Cool-down  Performed on first and last piece of equipment    Resistance Training Performed  Yes    VAD Patient?  No    PAD/SET Patient?  No      Pain Assessment   Currently in Pain?  No/denies    Multiple Pain Sites  No          Social History   Tobacco Use  Smoking Status Never Smoker  Smokeless Tobacco Never Used    Goals Met:  Independence with exercise equipment Exercise tolerated well No report of cardiac concerns or symptoms Strength training completed today  Goals Unmet:  Not Applicable  Comments: Pt able to follow exercise prescription today without complaint.  Will continue to monitor for progression.    Dr. MaEmily Filberts Medical Director for [facility].

## 2018-03-01 ENCOUNTER — Ambulatory Visit: Payer: Medicare Other | Admitting: Nurse Practitioner

## 2018-03-01 ENCOUNTER — Encounter: Payer: Self-pay | Admitting: Nurse Practitioner

## 2018-03-01 VITALS — BP 104/80 | HR 82 | Ht 72.0 in | Wt 163.0 lb

## 2018-03-01 DIAGNOSIS — I255 Ischemic cardiomyopathy: Secondary | ICD-10-CM

## 2018-03-01 DIAGNOSIS — I10 Essential (primary) hypertension: Secondary | ICD-10-CM

## 2018-03-01 DIAGNOSIS — I2510 Atherosclerotic heart disease of native coronary artery without angina pectoris: Secondary | ICD-10-CM

## 2018-03-01 DIAGNOSIS — R5383 Other fatigue: Secondary | ICD-10-CM

## 2018-03-01 DIAGNOSIS — I951 Orthostatic hypotension: Secondary | ICD-10-CM | POA: Diagnosis not present

## 2018-03-01 DIAGNOSIS — I5022 Chronic systolic (congestive) heart failure: Secondary | ICD-10-CM

## 2018-03-01 DIAGNOSIS — E785 Hyperlipidemia, unspecified: Secondary | ICD-10-CM

## 2018-03-01 MED ORDER — LOSARTAN POTASSIUM 25 MG PO TABS: 25.0000 mg | ORAL_TABLET | Freq: Every day | ORAL | 1 refills | Status: DC

## 2018-03-01 MED ORDER — METOPROLOL SUCCINATE ER 25 MG PO TB24: 25.0000 mg | ORAL_TABLET | Freq: Every day | ORAL | 1 refills | Status: DC

## 2018-03-01 MED ORDER — TICAGRELOR 90 MG PO TABS: 90.0000 mg | ORAL_TABLET | Freq: Two times a day (BID) | ORAL | 1 refills | Status: DC

## 2018-03-01 MED ORDER — ATORVASTATIN CALCIUM 80 MG PO TABS: 80.0000 mg | ORAL_TABLET | Freq: Every day | ORAL | 3 refills | Status: DC

## 2018-03-01 NOTE — Patient Instructions (Signed)
Medication Instructions: DECREASE the Losartan to 25 mg daily  If you need a refill on your cardiac medications before your next appointment, please call your pharmacy.   Labwork: Your provider would like for you to have the following labs today: CMET, CBC, and TSH  Follow-Up: Your physician wants you to follow-up in one month with Dr. Okey Dupre   Thank you for choosing Heartcare at [facility]!

## 2018-03-01 NOTE — Progress Notes (Signed)
Office Visit    Patient Name: Jesus Bolton Date of Encounter: 03/01/2018  Primary Care Provider:  Dione Housekeeper, MD Primary Cardiologist:  Yvonne Kendall, MD  Chief Complaint    72 year old male with a history of CAD status post recent non-STEMI, ischemic cardiomyopathy, hypertension, hyperlipidemia, stage III chronic kidney disease, and BPH, who presents for follow-up related to fatigue and weight loss.  Past Medical History    Past Medical History:  Diagnosis Date  . BPH (benign prostatic hyperplasia)   . CAD (coronary artery disease)    a. 12/2017 NSTEMI/PCI: LM nl, LAD 40-50p, 20-25m, LCX nl, RCA 20-30ost/p, 6m (3.25x12 Xience Sierra DES - post-dil to 3.29mm), 20d.  . CKD (chronic kidney disease), stage III (HCC)   . Elevated PSA   . Essential hypertension   . Indigestion   . Ischemic cardiomyopathy    a. 12/2017 Echo: EF 40-45%, diff HK, Gr1 DD, mild LVH. Mild AI. Mildly dil LA. Nl RV size/fxn. PASP 40-61mmHg.  Marland Kitchen Thyroid disease    Past Surgical History:  Procedure Laterality Date  . CARDIAC CATHETERIZATION    . GREEN LIGHT LASER TURP (TRANSURETHRAL RESECTION OF PROSTATE N/A 01/02/2015   Procedure: GREEN LIGHT LASER TURP (TRANSURETHRAL RESECTION OF PROSTATE;  Surgeon: Orson Ape, MD;  Location: [facility] ORS;  Service: Urology;  Laterality: N/A;  . LEFT HEART CATH AND CORONARY ANGIOGRAPHY N/A 12/23/2017   Procedure: LEFT HEART CATH AND CORONARY ANGIOGRAPHY;  Surgeon: Yvonne Kendall, MD;  Location: [facility] INVASIVE CV LAB;  Service: Cardiovascular;  Laterality: N/A;    Allergies  Allergies  Allergen Reactions  . Sulfa Antibiotics Rash    History of Present Illness    72 year old male with the above complex past medical history including coronary artery disease status post non-STEMI in July 2019 with drug-eluting stent placement to the mid RCA.  EF was noted to be 40 to 45% at that time consistent with ischemic cardiomyopathy.  He is also treated for  hypertension, hyperlipidemia, stage III chronic kidney disease, and BPH.  He was last seen in clinic on July 31, at which time he was doing reasonably well from a cardiac standpoint but was having issues with anxiety and insomnia.  Since then, both he and his wife have noted persistent insomnia as well as fatigue and unintentional weight loss of 9 pounds since July.  Patient has been participating in cardiac rehabilitation and has tolerated that well without any chest pain or dyspnea.  He was recently seen by primary care related to fatigue and weight loss and referred to gastroenterology.  That appointment is pending.  His  blocker dose was also halved.  He has not had any blood work since August.  He denies chest pain, dyspnea, palpitations, PND, orthopnea, syncope, edema, or early satiety.  He does experience lightheadedness when standing.  Home Medications    Prior to Admission medications   Medication Sig Start Date End Date Taking? Authorizing Provider  aspirin 81 MG chewable tablet Chew 1 tablet (81 mg total) by mouth daily. 12/24/17   Alford Highland, MD  atorvastatin (LIPITOR) 80 MG tablet Take 1 tablet (80 mg total) by mouth daily at 6 PM. 01/20/18   End, Cristal Deer, MD  busPIRone (BUSPAR) 7.5 MG tablet  12/29/17   [provider]  calcitRIOL (ROCALTROL) 0.25 MCG capsule Take 0.25 mcg by mouth daily. 12/09/17   [provider]  cyanocobalamin 1000 MCG tablet Take 1,000 mcg by mouth daily.    [provider]  losartan (COZAAR) 50 MG tablet Take 1 tablet (50 mg total) by mouth daily. 01/14/18 04/14/18  End, Cristal Deer, MD  metoprolol succinate (TOPROL-XL) 50 MG 24 hr tablet Take 1 tablet (50 mg total) by mouth daily. Take with or immediately following a meal. 01/13/18   End, Cristal Deer, MD  Multiple Vitamins-Minerals (CENTRUM SILVER 50+MEN) TABS Take 1 tablet by mouth daily.    [provider]  nitroGLYCERIN (NITROSTAT) 0.4 MG SL tablet Place 1 tablet (0.4 mg  total) under the tongue every 5 (five) minutes as needed for chest pain. 12/24/17   Alford Highland, MD  ticagrelor (BRILINTA) 90 MG TABS tablet Take 1 tablet (90 mg total) by mouth 2 (two) times daily. 01/20/18   End, Cristal Deer, MD    Review of Systems    Fatigue, unintentional weight loss, and orthostatic lightheadedness.  He denies chest pain, dyspnea, palpitations, PND, orthopnea, syncope, edema, or early satiety.  All other systems reviewed and are otherwise negative except as noted above.  Physical Exam    VS:  BP 104/80 (BP Location: Left Arm, Patient Position: Sitting, Cuff Size: Normal)   Pulse 82   Ht 6' (1.829 m)   Wt 163 lb (73.9 kg)   BMI 22.11 kg/m  , BMI Body mass index is 22.11 kg/m.  Orthostatic VS for the past 24 hrs:  BP- Lying Pulse- Lying BP- Sitting Pulse- Sitting BP- Standing at 0 minutes Pulse- Standing at 0 minutes  03/01/18 1411 119/73 75 112/75 89 111/71 114   GEN: Well nourished, well developed, in no acute distress. HEENT: normal. Neck: Supple, no JVD, carotid bruits, or masses. Cardiac: RRR, no murmurs, rubs, or gallops. No clubbing, cyanosis, edema.  Radials/DP/PT 2+ and equal bilaterally.  Respiratory:  Respirations regular and unlabored, clear to auscultation bilaterally. GI: Soft, nontender, nondistended, BS + x 4. MS: no deformity or atrophy. Skin: warm and dry, no rash. Neuro:  Strength and sensation are intact. Psych: Normal affect.  Accessory Clinical Findings    ECG personally reviewed by me today -regular sinus rhythm, 82, left axis deviation, no acute ST or T changes.- no acute changes.  Assessment & Plan    1.  Fatigue/unintentional weight loss: Over the past 2 months, patient has had fatigue with orthostatic lightheadedness and 9 pound unintentional weight loss.  His wife says that he initially it cut calories way back following his MI but both he and his wife now say that he is eating higher caloric foods and appetite is somewhat  better.  His blood pressure is soft today at 104/80 and he is orthostatic - dropping from 119 to 104 with a rise in HR from 75 to 127.  I suspect he is dehydrated.  I encouraged PO fluids and have dropped his losartan to 25mg  daily.  F/u cbc, cmet, and tsh today.  2.  Orthostatic hypotension:  See above. Orthostatic by HR and BP today.  Reducing losartan to 25mg  daily.  F/u labs.  Push PO fluids.  3.  Coronary artery disease: Status post non-STEMI in July with PCI and drug-eluting stent placement RCA.  He has not been having any chest pain or dyspnea and has tolerated cardiac rehabilitation well.  He remains on aspirin, statin, beta-blocker, Brilinta, and ARB.  4.  Hyperlipidemia: LDL was 95 in July with normal LFTs at that time.  F/u LFTs today.  He is not fasting today.  We will have to arrange for follow-up lipids in the near future.  5.  Insomnia:  Sleeping  poorly since April in the setting of some stressful real estate issues.  He recently had a sleep study and those results are pending.  6.  ICM/HFrEF:  Euvolemic on exam.  F/u labs today.  Cont  blocker and ARB (reducing dose).  7.  Disposition:  F/u labs today.  F/u in clinic in 1 month or sooner if necessary.  Nicolasa Ducking, NP 03/01/2018, 2:23 PM

## 2018-03-02 ENCOUNTER — Telehealth: Payer: Self-pay | Admitting: Nurse Practitioner

## 2018-03-02 ENCOUNTER — Encounter: Payer: Medicare Other | Admitting: *Deleted

## 2018-03-02 DIAGNOSIS — Z955 Presence of coronary angioplasty implant and graft: Secondary | ICD-10-CM

## 2018-03-02 DIAGNOSIS — I214 Non-ST elevation (NSTEMI) myocardial infarction: Secondary | ICD-10-CM

## 2018-03-02 LAB — COMPREHENSIVE METABOLIC PANEL
A/G RATIO: 2.3 — AB (ref 1.2–2.2)
ALT: 19 IU/L (ref 0–44)
AST: 21 IU/L (ref 0–40)
Albumin: 4.1 g/dL (ref 3.5–4.8)
Alkaline Phosphatase: 120 IU/L — ABNORMAL HIGH (ref 39–117)
BUN/Creatinine Ratio: 11 (ref 10–24)
BUN: 19 mg/dL (ref 8–27)
Bilirubin Total: 0.4 mg/dL (ref 0.0–1.2)
CHLORIDE: 104 mmol/L (ref 96–106)
CO2: 21 mmol/L (ref 20–29)
Calcium: 9.5 mg/dL (ref 8.6–10.2)
Creatinine, Ser: 1.72 mg/dL — ABNORMAL HIGH (ref 0.76–1.27)
GFR calc non Af Amer: 39 mL/min/{1.73_m2} — ABNORMAL LOW (ref >59)
GFR, EST AFRICAN AMERICAN: 45 mL/min/{1.73_m2} — AB (ref >59)
GLOBULIN, TOTAL: 1.8 g/dL (ref 1.5–4.5)
GLUCOSE: 97 mg/dL (ref 65–99)
Potassium: 4.6 mmol/L (ref 3.5–5.2)
Sodium: 141 mmol/L (ref 134–144)
TOTAL PROTEIN: 5.9 g/dL — AB (ref 6.0–8.5)

## 2018-03-02 LAB — CBC
HEMOGLOBIN: 13.4 g/dL (ref 13.0–17.7)
Hematocrit: 38.5 % (ref 37.5–51.0)
MCH: 30.1 pg (ref 26.6–33.0)
MCHC: 34.8 g/dL (ref 31.5–35.7)
MCV: 87 fL (ref 79–97)
PLATELETS: 360 10*3/uL (ref 150–450)
RBC: 4.45 x10E6/uL (ref 4.14–5.80)
RDW: 12.8 % (ref 12.3–15.4)
WBC: 10.1 10*3/uL (ref 3.4–10.8)

## 2018-03-02 LAB — TSH: TSH: 2.92 u[IU]/mL (ref 0.450–4.500)

## 2018-03-02 NOTE — Telephone Encounter (Signed)
I left a message for the patient to call back regarding CBC/ TSH/ CMET results from 9/17 with Ward Givens, NP.

## 2018-03-02 NOTE — Progress Notes (Signed)
Daily Session Note  Patient Details  Name: Jesus Bolton MRN: [id number] Date of Birth: November 28, 1945 Referring Provider:     Cardiac Rehab from 01/11/2018 in [facility] Cardiac and Pulmonary Rehab  Referring Provider  End, Harrell Gave MD      Encounter Date: 03/02/2018  Check In: Session Check In - 03/02/18 0942      Check-In   Supervising physician immediately available to respond to emergencies  See telemetry face sheet for immediately available ER MD    Location  [facility]-Cardiac & Pulmonary Rehab    Staff Present  Alberteen Sam, MA, RCEP, CCRP, Exercise Physiologist;Joseph Foy [place], BA, ACSM CEP, Exercise Physiologist;Krista Frederico Hamman, RN BSN    Medication changes reported      No    Fall or balance concerns reported     No    Warm-up and Cool-down  Performed on first and last piece of equipment    Resistance Training Performed  Yes    VAD Patient?  No    PAD/SET Patient?  No      Pain Assessment   Currently in Pain?  No/denies          Social History   Tobacco Use  Smoking Status Never Smoker  Smokeless Tobacco Never Used    Goals Met:  Independence with exercise equipment Exercise tolerated well No report of cardiac concerns or symptoms Strength training completed today  Goals Unmet:  Not Applicable  Comments: Pt able to follow exercise prescription today without complaint.  Will continue to monitor for progression.    Dr. Emily Filbert is Medical Director for [facility].

## 2018-03-03 NOTE — Telephone Encounter (Signed)
Results called to pt. Pt verbalized understanding. See result note.

## 2018-03-04 DIAGNOSIS — Z955 Presence of coronary angioplasty implant and graft: Secondary | ICD-10-CM

## 2018-03-04 DIAGNOSIS — I214 Non-ST elevation (NSTEMI) myocardial infarction: Secondary | ICD-10-CM

## 2018-03-04 NOTE — Progress Notes (Signed)
Daily Session Note  Patient Details  Name: Jesus Bolton MRN: [id number] Date of Birth: Mar 23, 1946 Referring Provider:     Cardiac Rehab from 01/11/2018 in [facility] Cardiac and Pulmonary Rehab  Referring Provider  End, Harrell Gave MD      Encounter Date: 03/04/2018  Check In: Session Check In - 03/04/18 0915      Check-In   Supervising physician immediately available to respond to emergencies  See telemetry face sheet for immediately available ER MD    Location  [facility]-Card[facility] & Pul[facility] Re[facility]    Staff Present  Alberteen Sam, MA, RCEP, CCRP, Exercise Physiologist;Joseph Foy [place], BA, ACSM CEP, Exercise Physiologist;Carroll Enterkin, RN, BSN    Medication changes reported      No    Fall or balance concerns reported     No    Warm-up and Cool-down  Performed on first and last piece of equipment    Resistance Training Performed  Yes    VAD Patient?  No    PAD/SET Patient?  No      Pain Assessment   Currently in Pain?  No/denies    Multiple Pain Sites  No          Social History   Tobacco Use  Smoking Status Never Smoker  Smokeless Tobacco Never Used    Goals Met:  Independence with exercise equipment Exercise tolerated well No report of cardiac concerns or symptoms Strength training completed today  Goals Unmet:  Not Applicable  Comments: Pt able to follow exercise prescription today without complaint.  Will continue to monitor for progression.    Dr. Emily Filbert is Medical Director for [place] [facility].

## 2018-03-09 ENCOUNTER — Encounter: Payer: Medicare Other | Admitting: *Deleted

## 2018-03-09 VITALS — Ht 71.6 in | Wt 163.0 lb

## 2018-03-09 DIAGNOSIS — I214 Non-ST elevation (NSTEMI) myocardial infarction: Secondary | ICD-10-CM | POA: Diagnosis not present

## 2018-03-09 DIAGNOSIS — Z955 Presence of coronary angioplasty implant and graft: Secondary | ICD-10-CM

## 2018-03-09 NOTE — Progress Notes (Signed)
Discharge Progress Report  Patient Details  Name: Jesus Bolton MRN: [id number] Date of Birth: [birth year] Referring Provider:     Cardiac Rehab from 01/11/2018 in [facility]  Referring Provider  End, Harrell Gave MD       Number of Visits: 29  Reason for Discharge:  Patient reached a stable level of exercise. Patient independent in their exercise. Patient has met program and personal goals. Early Exit:  Personal  Smoking History:  Social History   Tobacco Use  Smoking Status Never Smoker  Smokeless Tobacco Never Used    Diagnosis:  NSTEMI (non-ST elevated myocardial infarction) ([place])  Status post coronary artery stent placement  ADL [facility]:   Initial Exercise Prescription: Initial Exercise Prescription - 01/11/18 1500      Date of Initial Exercise RX and Referring Provider   Date  01/11/18    Referring Provider  End, Harrell Gave MD      Treadmill   MPH  2.4    Grade  1    Minutes  15    METs  3.17      Recumbant Bike   Level  2    RPM  50    Watts  28    Minutes  15    METs  3      NuStep   Level  2    SPM  80    Minutes  15    METs  3      Prescription Details   Frequency (times per week)  2    Duration  Progress to 30 minutes of continuous aerobic without signs/symptoms of physical distress      Intensity   THRR 40-80% of Max Heartrate  109-135    Ratings of Perceived Exertion  11-13    Perceived Dyspnea  0-4      Progression   Progression  Continue to progress workloads to maintain intensity without signs/symptoms of physical distress.      Resistance Training   Training Prescription  Yes    Weight  3 lbs    Reps  10-15       Discharge Exercise Prescription (Final Exercise Prescription Changes): Exercise Prescription Changes - 03/03/18 0900      Response to Exercise   Blood Pressure (Admit)  128/68    Blood Pressure (Exercise)  132/70    Blood Pressure (Exit)  126/66    Heart Rate (Admit)  84 bpm    Heart  Rate (Exercise)  140 bpm    Heart Rate (Exit)  102 bpm    Rating of Perceived Exertion (Exercise)  14    Symptoms  none    Duration  Continue with 30 min of aerobic exercise without signs/symptoms of physical distress.    Intensity  THRR unchanged      Progression   Progression  Continue to progress workloads to maintain intensity without signs/symptoms of physical distress.    Average METs  3.19      Resistance Training   Training Prescription  Yes    Weight  3 lbs    Reps  10-15      Interval Training   Interval Training  No      Treadmill   MPH  2    Grade  15    Minutes  15    METs  2.81      Recumbant Bike   Level  2    Watts  29    Minutes  15    METs  3.76      NuStep   Level  2    Minutes  15    METs  3.5      Home Exercise Plan   Plans to continue exercise at  Home (comment)    Frequency  Add 3 additional days to program exercise sessions.    Initial Home Exercises Provided  01/26/18       Functional Capacity: 6 Minute Walk    Row Name 01/11/18 1448 03/09/18 1047       6 Minute Walk   Phase  Initial  Discharge    Distance  1325 feet  1600 feet    Distance % Change  -  20.7 %    Distance Feet Change  -  275 ft    Walk Time  6 minutes  6 minutes    # of Rest Breaks  0  0    MPH  2.51  3.03    METS  3.16  3.88    RPE  13  12    Perceived Dyspnea   -  1    VO2 Peak  11.04  13.6    Symptoms  No  Yes (comment)    Comments  -  SOB    Resting HR  83 bpm  92 bpm    Resting BP  128/70  122/70    Resting Oxygen Saturation   100 %  -    Exercise Oxygen Saturation  during 6 min walk  99 %  -    Max Ex. HR  92 bpm  125 bpm    Max Ex. BP  138/74  122/66    2 Minute Post BP  122/70  -       Psychological, QOL, Others - Outcomes: PHQ 2/9: Depression screen [facility] 2/9 03/09/2018 02/09/2018 01/11/2018  Decreased Interest 2 2 3   Down, Depressed, Hopeless 2 2 3   PHQ - 2 Score 4 4 6   Altered sleeping 2 2 3   Tired, decreased energy 3 2 3   Change in appetite  2 2 3   Feeling bad or failure about yourself  2 1 2   Trouble concentrating 2 1 2   Moving slowly or fidgety/restless 2 2 2   Suicidal thoughts 0 0 0  PHQ-9 Score 17 14 21   Difficult doing work/chores - Somewhat difficult Somewhat difficult    Quality of Life: Quality of Life - 03/09/18 1404      Quality of Life   Select  Quality of Life      Quality of Life Scores   Health/Function Pre  19.2 %    Health/Function Post  13.37 %    Health/Function % Change  -30.36 %    Socioeconomic Pre  20.67 %    Socioeconomic Post  20.14 %    Socioeconomic % Change   -2.56 %    Psych/Spiritual Pre  20.5 %    Psych/Spiritual Post  14.79 %    Psych/Spiritual % Change  -27.85 %    Family Pre  28.8 %    Family Post  26.4 %    Family % Change  -8.33 %    GLOBAL Pre  21.2 %    GLOBAL Post  16.97 %    GLOBAL % Change  -19.95 %       Personal Goals: Goals established at orientation with interventions provided to work toward goal. Personal Goals and Risk Factors at Admission - 01/11/18 1320  Core Components/Risk Factors/Patient Goals on Admission    Weight Management  Yes;Weight Gain    Intervention  Weight Management: Develop a combined nutrition and exercise program designed to reach desired caloric intake, while maintaining appropriate intake of nutrient and fiber, sodium and fats, and appropriate energy expenditure required for the weight goal.;Weight Management: Provide education and appropriate resources to help participant work on and attain dietary goals.    Admit Weight  169 lb 9.6 oz (76.9 kg)    Goal Weight: Short Term  174 lb (78.9 kg)    Goal Weight: Long Term  180 lb (81.6 kg)    Expected Outcomes  Short Term: Continue to assess and modify interventions until short term weight is achieved;Long Term: Adherence to nutrition and physical activity/exercise program aimed toward attainment of established weight goal;Weight Gain: Understanding of general recommendations for a high calorie,  high protein meal plan that promotes weight gain by distributing calorie intake throughout the day with the consumption for 4-5 meals, snacks, and/or supplements;Understanding of distribution of calorie intake throughout the day with the consumption of 4-5 meals/snacks;Understanding recommendations for meals to include 15-35% energy as protein, 25-35% energy from fat, 35-60% energy from carbohydrates, less than 235m of dietary cholesterol, 20-35 gm of total fiber daily    Hypertension  Yes    Intervention  Provide education on lifestyle modifcations including regular physical activity/exercise, weight management, moderate sodium restriction and increased consumption of fresh fruit, vegetables, and low fat dairy, alcohol moderation, and smoking cessation.;Monitor prescription use compliance.    Expected Outcomes  Short Term: Continued assessment and intervention until BP is < 140/974mHG in hypertensive participants. < 130/8074mG in hypertensive participants with diabetes, heart failure or chronic kidney disease.;Long Term: Maintenance of blood pressure at goal levels.    Lipids  Yes    Intervention  Provide education and support for participant on nutrition & aerobic/resistive exercise along with prescribed medications to achieve LDL <75m14mDL >40mg63m Expected Outcomes  Short Term: Participant states understanding of desired cholesterol values and is compliant with medications prescribed. Participant is following exercise prescription and nutrition guidelines.;Long Term: Cholesterol controlled with medications as prescribed, with individualized exercise RX and with personalized nutrition plan. Value goals: LDL < 75mg,84m > 40 mg.        Personal Goals Discharge: Goals and Risk Factor Review    Row Name 02/11/18 0954 03/09/18 1011           Core Components/Risk Factors/Patient Goals Review   Personal Goals Review  Weight Management/Obesity;Hypertension;Lipids  Weight  Management/Obesity;Hypertension;Lipids      Review  Aaron's weight has remained steady.  He still wants to work on a gaining a little.  He is lifting weights and exercising. We talked making sure he is getting enough protein in his diet.  His blood pressures have been good in class and he is checking them at home.  His reading at home are a little higher in the evening, which he checks abour 2-3x week.  He is also monitoring his kidney functions.  He feels that his medications are working well for him.   HobartDheerajsing some weight.  He went to doctor yesterday.  They are now looking into gastro issues. We talked about making sure he is exercising to maintain his lean body mass.  His blood pressures have been good.  He is doing well on most of his medications but has continued to stay tired, weak, confused.  They have made  a lot of changes recently as well which is limiting.       Expected Outcomes  Short: Continue to work on gaining weight.  Long: Continue to monitor risk factors.   Short: Continue to work on weight gain. Long: Montior risk factors.          Exercise Goals and Review: Exercise Goals    Row Name 01/11/18 1504             Exercise Goals   Increase Physical Activity  Yes       Intervention  Provide advice, education, support and counseling about physical activity/exercise needs.;Develop an individualized exercise prescription for aerobic and resistive training based on initial evaluation findings, risk stratification, comorbidities and participant's personal goals.       Expected Outcomes  Short Term: Attend rehab on a regular basis to increase amount of physical activity.;Long Term: Add in home exercise to make exercise part of routine and to increase amount of physical activity.;Long Term: Exercising regularly at least 3-5 days a week.       Increase Strength and Stamina  Yes       Intervention  Provide advice, education, support and counseling about physical activity/exercise  needs.;Develop an individualized exercise prescription for aerobic and resistive training based on initial evaluation findings, risk stratification, comorbidities and participant's personal goals.       Expected Outcomes  Short Term: Increase workloads from initial exercise prescription for resistance, speed, and METs.;Short Term: Perform resistance training exercises routinely during rehab and add in resistance training at home;Long Term: Improve cardiorespiratory fitness, muscular endurance and strength as measured by increased METs and functional capacity (6MWT)       Able to understand and use rate of perceived exertion (RPE) scale  Yes       Intervention  Provide education and explanation on how to use RPE scale       Expected Outcomes  Short Term: Able to use RPE daily in rehab to express subjective intensity level;Long Term:  Able to use RPE to guide intensity level when exercising independently       Knowledge and understanding of Target Heart Rate Range (THRR)  Yes       Intervention  Provide education and explanation of THRR including how the numbers were predicted and where they are located for reference       Expected Outcomes  Short Term: Able to state/look up THRR;Short Term: Able to use daily as guideline for intensity in rehab;Long Term: Able to use THRR to govern intensity when exercising independently       Able to check pulse independently  Yes       Intervention  Provide education and demonstration on how to check pulse in carotid and radial arteries.;Review the importance of being able to check your own pulse for safety during independent exercise       Expected Outcomes  Short Term: Able to explain why pulse checking is important during independent exercise;Long Term: Able to check pulse independently and accurately       Understanding of Exercise Prescription  Yes       Intervention  Provide education, explanation, and written materials on patient's individual exercise prescription        Expected Outcomes  Short Term: Able to explain program exercise prescription;Long Term: Able to explain home exercise prescription to exercise independently          Nutrition & Weight - Outcomes: Pre Biometrics - 01/11/18 1504  Pre Biometrics   Height  5' 11.6" (1.819 m)    Weight  169 lb 9.6 oz (76.9 kg)    Waist Circumference  34.5 inches    Hip Circumference  35.5 inches    Waist to Hip Ratio  0.97 %    BMI (Calculated)  23.25    Single Leg Stand  2.88 seconds      Post Biometrics - 03/09/18 1048       Post  Biometrics   Height  5' 11.6" (1.819 m)    Weight  163 lb (73.9 kg)    Waist Circumference  34.5 inches    Hip Circumference  36 inches    Waist to Hip Ratio  0.96 %    BMI (Calculated)  22.35    Single Leg Stand  3.81 seconds       Nutrition: Nutrition Therapy & Goals - 02/02/18 1137      Nutrition Therapy   Diet  TLC    Drug/Food Interactions  Statins/Certain Fruits    Protein (specify units)  9oz    Fiber  35 grams    Whole Grain Foods  3 servings   typically eats white/wheat or [place] bread   Saturated Fats  16 max. grams    Fruits and Vegetables  6 servings/day   8 ideal, working to increase fruit and vegetable intake   Sodium  1500 grams      Personal Nutrition Goals   Nutrition Goal  Continue to work on eating less fried foods, less full-fat dairy, more fish and more fruits & vegetables    Personal Goal #2  Start to become familiar with reading nutrition facts labels, paying particular attention to sodium, total fat, types of fat and added sugar    Personal Goal #3  Great job with starting to eat breakfast! Continue this practice even when you are no longer attending our classes    Personal Goal #4  Swap your sweets; choose fruit rather than cookies as snacks more often    Comments  He has made several changes to his dietary habits since having a heart attack. Lunch is his largest meal of the day. He has lost approx 30# in 3 months and would  like to gain some weight      Intervention Plan   Intervention  Prescribe, educate and counsel regarding individualized specific dietary modifications aiming towards targeted core components such as weight, hypertension, lipid management, diabetes, heart failure and other comorbidities.;Nutrition handout(s) given to patient.   gave heart healthy eating handout   Expected Outcomes  Short Term Goal: Understand basic principles of dietary content, such as calories, fat, sodium, cholesterol and nutrients.;Long Term Goal: Adherence to prescribed nutrition plan.;Short Term Goal: A plan has been developed with personal nutrition goals set during dietitian appointment.       Nutrition Discharge: Nutrition Assessments - 03/09/18 1401      MEDFICTS Scores   Pre Score  32    Post Score  47    Score Difference  15       Education Questionnaire Score: Knowledge Questionnaire Score - 03/09/18 1048      Knowledge Questionnaire Score   Pre Score  24/26    Post Score  22/26   test reviewed with pt today      Goals reviewed with patient; copy given to patient.

## 2018-03-09 NOTE — Patient Instructions (Signed)
Discharge Patient Instructions  Patient Details  Name: Jesus Bolton MRN: [id number] Date of Birth: December 14, 1945 Referring Provider:  Nelva Bush, MD   Number of Visits: 29  Reason for Discharge:  Patient reached a stable level of exercise. Patient independent in their exercise. Patient has met program and personal goals. Early Exit:  Personal  Smoking History:  Social History   Tobacco Use  Smoking Status Never Smoker  Smokeless Tobacco Never Used    Diagnosis:  NSTEMI (non-ST elevated myocardial infarction) ([place])  Status post coronary artery stent placement  Initial Exercise Prescription: Initial Exercise Prescription - 01/11/18 1500      Date of Initial Exercise RX and Referring Provider   Date  01/11/18    Referring Provider  End, Harrell Gave MD      Treadmill   MPH  2.4    Grade  1    Minutes  15    METs  3.17      Recumbant Bike   Level  2    RPM  50    Watts  28    Minutes  15    METs  3      NuStep   Level  2    SPM  80    Minutes  15    METs  3      Prescription Details   Frequency (times per week)  2    Duration  Progress to 30 minutes of continuous aerobic without signs/symptoms of physical distress      Intensity   THRR 40-80% of Max Heartrate  109-135    Ratings of Perceived Exertion  11-13    Perceived Dyspnea  0-4      Progression   Progression  Continue to progress workloads to maintain intensity without signs/symptoms of physical distress.      Resistance Training   Training Prescription  Yes    Weight  3 lbs    Reps  10-15       Discharge Exercise Prescription (Final Exercise Prescription Changes): Exercise Prescription Changes - 03/03/18 0900      Response to Exercise   Blood Pressure (Admit)  128/68    Blood Pressure (Exercise)  132/70    Blood Pressure (Exit)  126/66    Heart Rate (Admit)  84 bpm    Heart Rate (Exercise)  140 bpm    Heart Rate (Exit)  102 bpm    Rating of Perceived Exertion (Exercise)  14    Symptoms  none    Duration  Continue with 30 min of aerobic exercise without signs/symptoms of physical distress.    Intensity  THRR unchanged      Progression   Progression  Continue to progress workloads to maintain intensity without signs/symptoms of physical distress.    Average METs  3.19      Resistance Training   Training Prescription  Yes    Weight  3 lbs    Reps  10-15      Interval Training   Interval Training  No      Treadmill   MPH  2    Grade  15    Minutes  15    METs  2.81      Recumbant Bike   Level  2    Watts  29    Minutes  15    METs  3.76      NuStep   Level  2    Minutes  15  METs  3.5      Home Exercise Plan   Plans to continue exercise at  Home (comment)    Frequency  Add 3 additional days to program exercise sessions.    Initial Home Exercises Provided  01/26/18       Functional Capacity: 6 Minute Walk    Row Name 01/11/18 1448 03/09/18 1047       6 Minute Walk   Phase  Initial  Discharge    Distance  1325 feet  1600 feet    Distance % Change  -  20.7 %    Distance Feet Change  -  275 ft    Walk Time  6 minutes  6 minutes    # of Rest Breaks  0  0    MPH  2.51  3.03    METS  3.16  3.88    RPE  13  12    Perceived Dyspnea   -  1    VO2 Peak  11.04  13.6    Symptoms  No  Yes (comment)    Comments  -  SOB    Resting HR  83 bpm  92 bpm    Resting BP  128/70  122/70    Resting Oxygen Saturation   100 %  -    Exercise Oxygen Saturation  during 6 min walk  99 %  -    Max Ex. HR  92 bpm  125 bpm    Max Ex. BP  138/74  122/66    2 Minute Post BP  122/70  -       Quality of Life: Quality of Life - 01/11/18 1030      Quality of Life   Select  Quality of Life      Quality of Life Scores   Health/Function Pre  19.2 %    Socioeconomic Pre  20.67 %    Psych/Spiritual Pre  20.5 %    Family Pre  28.8 %    GLOBAL Pre  21.2 %       Personal Goals: Goals established at orientation with interventions provided to work toward  goal. Personal Goals and Risk Factors at Admission - 01/11/18 1320      Core Components/Risk Factors/Patient Goals on Admission    Weight Management  Yes;Weight Gain    Intervention  Weight Management: Develop a combined nutrition and exercise program designed to reach desired caloric intake, while maintaining appropriate intake of nutrient and fiber, sodium and fats, and appropriate energy expenditure required for the weight goal.;Weight Management: Provide education and appropriate resources to help participant work on and attain dietary goals.    Admit Weight  169 lb 9.6 oz (76.9 kg)    Goal Weight: Short Term  174 lb (78.9 kg)    Goal Weight: Long Term  180 lb (81.6 kg)    Expected Outcomes  Short Term: Continue to assess and modify interventions until short term weight is achieved;Long Term: Adherence to nutrition and physical activity/exercise program aimed toward attainment of established weight goal;Weight Gain: Understanding of general recommendations for a high calorie, high protein meal plan that promotes weight gain by distributing calorie intake throughout the day with the consumption for 4-5 meals, snacks, and/or supplements;Understanding of distribution of calorie intake throughout the day with the consumption of 4-5 meals/snacks;Understanding recommendations for meals to include 15-35% energy as protein, 25-35% energy from fat, 35-60% energy from carbohydrates, less than 269m of dietary cholesterol, 20-35 gm of  total fiber daily    Hypertension  Yes    Intervention  Provide education on lifestyle modifcations including regular physical activity/exercise, weight management, moderate sodium restriction and increased consumption of fresh fruit, vegetables, and low fat dairy, alcohol moderation, and smoking cessation.;Monitor prescription use compliance.    Expected Outcomes  Short Term: Continued assessment and intervention until BP is < 140/81m HG in hypertensive participants. < 130/834m HG in hypertensive participants with diabetes, heart failure or chronic kidney disease.;Long Term: Maintenance of blood pressure at goal levels.    Lipids  Yes    Intervention  Provide education and support for participant on nutrition & aerobic/resistive exercise along with prescribed medications to achieve LDL <7049mHDL >14m46m  Expected Outcomes  Short Term: Participant states understanding of desired cholesterol values and is compliant with medications prescribed. Participant is following exercise prescription and nutrition guidelines.;Long Term: Cholesterol controlled with medications as prescribed, with individualized exercise RX and with personalized nutrition plan. Value goals: LDL < 70mg74mL > 40 mg.        Personal Goals Discharge: Goals and Risk Factor Review - 03/09/18 1011      Core Components/Risk Factors/Patient Goals Review   Personal Goals Review  Weight Management/Obesity;Hypertension;Lipids    Review  HobarEligaosing some weight.  He went to doctor yesterday.  They are now looking into gastro issues. We talked about making sure he is exercising to maintain his lean body mass.  His blood pressures have been good.  He is doing well on most of his medications but has continued to stay tired, weak, confused.  They have made a lot of changes recently as well which is limiting.     Expected Outcomes  Short: Continue to work on weight gain. Long: Montior risk factors.        Exercise Goals and Review: Exercise Goals    Row Name 01/11/18 1504             Exercise Goals   Increase Physical Activity  Yes       Intervention  Provide advice, education, support and counseling about physical activity/exercise needs.;Develop an individualized exercise prescription for aerobic and resistive training based on initial evaluation findings, risk stratification, comorbidities and participant's personal goals.       Expected Outcomes  Short Term: Attend rehab on a regular basis to  increase amount of physical activity.;Long Term: Add in home exercise to make exercise part of routine and to increase amount of physical activity.;Long Term: Exercising regularly at least 3-5 days a week.       Increase Strength and Stamina  Yes       Intervention  Provide advice, education, support and counseling about physical activity/exercise needs.;Develop an individualized exercise prescription for aerobic and resistive training based on initial evaluation findings, risk stratification, comorbidities and participant's personal goals.       Expected Outcomes  Short Term: Increase workloads from initial exercise prescription for resistance, speed, and METs.;Short Term: Perform resistance training exercises routinely during rehab and add in resistance training at home;Long Term: Improve cardiorespiratory fitness, muscular endurance and strength as measured by increased METs and functional capacity (6MWT)       Able to understand and use rate of perceived exertion (RPE) scale  Yes       Intervention  Provide education and explanation on how to use RPE scale       Expected Outcomes  Short Term: Able to use RPE daily in rehab to  express subjective intensity level;Long Term:  Able to use RPE to guide intensity level when exercising independently       Knowledge and understanding of Target Heart Rate Range (THRR)  Yes       Intervention  Provide education and explanation of THRR including how the numbers were predicted and where they are located for reference       Expected Outcomes  Short Term: Able to state/look up THRR;Short Term: Able to use daily as guideline for intensity in rehab;Long Term: Able to use THRR to govern intensity when exercising independently       Able to check pulse independently  Yes       Intervention  Provide education and demonstration on how to check pulse in carotid and radial arteries.;Review the importance of being able to check your own pulse for safety during independent  exercise       Expected Outcomes  Short Term: Able to explain why pulse checking is important during independent exercise;Long Term: Able to check pulse independently and accurately       Understanding of Exercise Prescription  Yes       Intervention  Provide education, explanation, and written materials on patient's individual exercise prescription       Expected Outcomes  Short Term: Able to explain program exercise prescription;Long Term: Able to explain home exercise prescription to exercise independently          Nutrition & Weight - Outcomes: Pre Biometrics - 01/11/18 1504      Pre Biometrics   Height  5' 11.6" (1.819 m)    Weight  169 lb 9.6 oz (76.9 kg)    Waist Circumference  34.5 inches    Hip Circumference  35.5 inches    Waist to Hip Ratio  0.97 %    BMI (Calculated)  23.25    Single Leg Stand  2.88 seconds      Post Biometrics - 03/09/18 1048       Post  Biometrics   Height  5' 11.6" (1.819 m)    Weight  163 lb (73.9 kg)    Waist Circumference  34.5 inches    Hip Circumference  36 inches    Waist to Hip Ratio  0.96 %    BMI (Calculated)  22.35    Single Leg Stand  3.81 seconds       Nutrition: Nutrition Therapy & Goals - 02/02/18 1137      Nutrition Therapy   Diet  TLC    Drug/Food Interactions  Statins/Certain Fruits    Protein (specify units)  9oz    Fiber  35 grams    Whole Grain Foods  3 servings   typically eats white/wheat or [place] bread   Saturated Fats  16 max. grams    Fruits and Vegetables  6 servings/day   8 ideal, working to increase fruit and vegetable intake   Sodium  1500 grams      Personal Nutrition Goals   Nutrition Goal  Continue to work on eating less fried foods, less full-fat dairy, more fish and more fruits & vegetables    Personal Goal #2  Start to become familiar with reading nutrition facts labels, paying particular attention to sodium, total fat, types of fat and added sugar    Personal Goal #3  Great job with starting to  eat breakfast! Continue this practice even when you are no longer attending our classes    Personal Goal #4  Swap your sweets;  choose fruit rather than cookies as snacks more often    Comments  He has made several changes to his dietary habits since having a heart attack. Lunch is his largest meal of the day. He has lost approx 30# in 3 months and would like to gain some weight      Intervention Plan   Intervention  Prescribe, educate and counsel regarding individualized specific dietary modifications aiming towards targeted core components such as weight, hypertension, lipid management, diabetes, heart failure and other comorbidities.;Nutrition handout(s) given to patient.   gave heart healthy eating handout   Expected Outcomes  Short Term Goal: Understand basic principles of dietary content, such as calories, fat, sodium, cholesterol and nutrients.;Long Term Goal: Adherence to prescribed nutrition plan.;Short Term Goal: A plan has been developed with personal nutrition goals set during dietitian appointment.       Nutrition Discharge: Nutrition Assessments - 01/11/18 1030      MEDFICTS Scores   Pre Score  32       Education Questionnaire Score: Knowledge Questionnaire Score - 03/09/18 1048      Knowledge Questionnaire Score   Pre Score  24/26    Post Score  22/26   test reviewed with pt today      Goals reviewed with patient; copy given to patient.

## 2018-03-09 NOTE — Progress Notes (Signed)
Daily Session Note  Patient Details  Name: Jesus Bolton MRN: [id number] Date of Birth: 09-10-1945 Referring Provider:     Cardiac Rehab from 01/11/2018 in [facility]  Referring Provider  End, Harrell Gave MD      Encounter Date: 03/09/2018  Check In: Session Check In - 03/09/18 0928      Check-In   Supervising physician immediately available to respond to emergencies  See telemetry face sheet for immediately available ER MD    Location  [facility]-Cardiac & Pulmonary Rehab    Staff Present  Carson Myrtle, BS, RRT, Respiratory Lennie Hummer, MA, RCEP, CCRP, Exercise Physiologist;Joseph Foy [place], BA, ACSM CEP, Exercise Physiologist;Susanne Bice, RN, BSN, CCRP    Medication changes reported      No    Fall or balance concerns reported     No    Warm-up and Cool-down  Performed on first and last piece of equipment    Resistance Training Performed  Yes    VAD Patient?  No    PAD/SET Patient?  No      Pain Assessment   Currently in Pain?  No/denies          Social History   Tobacco Use  Smoking Status Never Smoker  Smokeless Tobacco Never Used    Goals Met:  Independence with exercise equipment Exercise tolerated well Personal goals reviewed No report of cardiac concerns or symptoms Strength training completed today  Goals Unmet:  Not Applicable  Comments: Pt able to follow exercise prescription today without complaint.  Will continue to monitor for progression.  [place] Name 01/11/18 1448 03/09/18 1047       6 Minute Walk   Phase  Initial  Discharge    Distance  1325 feet  1600 feet    Distance % Change  -  20.7 %    Distance Feet Change  -  275 ft    Walk Time  6 minutes  6 minutes    # of Rest Breaks  0  0    MPH  2.51  3.03    METS  3.16  3.88    RPE  13  12    Perceived Dyspnea   -  1    VO2 Peak  11.04  13.6    Symptoms  No  Yes (comment)    Comments  -  SOB    Resting HR  83 bpm  92  bpm    Resting BP  128/70  122/70    Resting Oxygen Saturation   100 %  -    Exercise Oxygen Saturation  during 6 min walk  99 %  -    Max Ex. HR  92 bpm  125 bpm    Max Ex. BP  138/74  122/66    2 Minute Post BP  122/70  -       Jesus Bolton graduated today from  rehab with 29 sessions completed.  Details of the patient's exercise prescription and what He needs to do in order to continue the prescription and progress were discussed with patient.  Patient was given a copy of prescription and goals.  Patient verbalized understanding.  Sher plans to continue to exercise by walking at home more.   Dr. Emily Filbert is Medical Director for [place] and [facility].

## 2018-03-09 NOTE — Progress Notes (Signed)
Cardiac Individual Treatment Plan  Patient Details  Name: Jesus Bolton MRN: [id number] Date of Birth: [birth year] Referring Provider:     Cardiac Rehab from 01/11/2018 in [facility] Cardiac and Pulmonary Rehab  Referring Provider  End, Harrell Gave MD      Initial Encounter Date:    Cardiac Rehab from 01/11/2018 in [facility] Cardiac and Pulmonary Rehab  Date  01/11/18      Visit Diagnosis: NSTEMI (non-ST elevated myocardial infarction) [facility])  Status post coronary artery stent placement  Patient's Home Medications on Admission:  Current Outpatient Medications:  .  aspirin 81 MG chewable tablet, Chew 1 tablet (81 mg total) by mouth daily., Disp: 30 tablet, Rfl: 0 .  atorvastatin (LIPITOR) 80 MG tablet, Take 1 tablet (80 mg total) by mouth daily at 6 PM., Disp: 30 tablet, Rfl: 3 .  busPIRone (BUSPAR) 10 MG tablet, Take 10 mg by mouth 2 (two) times daily. , Disp: , Rfl:  .  busPIRone (BUSPAR) 7.5 MG tablet, Take 7.5 mg by mouth daily. , Disp: , Rfl:  .  calcitRIOL (ROCALTROL) 0.25 MCG capsule, Take 0.25 mcg by mouth daily., Disp: , Rfl:  .  cyanocobalamin 1000 MCG tablet, Take 1,000 mcg by mouth daily., Disp: , Rfl:  .  losartan (COZAAR) 25 MG tablet, Take 1 tablet (25 mg total) by mouth daily., Disp: 90 tablet, Rfl: 1 .  metoprolol succinate (TOPROL-XL) 25 MG 24 hr tablet, Take 1 tablet (25 mg total) by mouth daily. Take with or immediately following a meal., Disp: 90 tablet, Rfl: 1 .  Multiple Vitamins-Minerals (CENTRUM SILVER 50+MEN) TABS, Take 1 tablet by mouth daily., Disp: , Rfl:  .  nitroGLYCERIN (NITROSTAT) 0.4 MG SL tablet, Place 1 tablet (0.4 mg total) under the tongue every 5 (five) minutes as needed for chest pain., Disp: 30 tablet, Rfl: 0 .  ticagrelor (BRILINTA) 90 MG TABS tablet, Take 1 tablet (90 mg total) by mouth 2 (two) times daily., Disp: 180 tablet, Rfl: 1  Past Medical History: Past Medical History:  Diagnosis Date  . BPH (benign prostatic hyperplasia)   . CAD (coronary  artery disease)    a. 12/2017 NSTEMI/PCI: LM nl, LAD 40-50p, 20-80m LCX nl, RCA 20-30ost/p, 885m3.25x12 Xience Sierra DES - post-dil to 3.44m84m 20d.  . CKD (chronic kidney disease), stage III [place] Elevated PSA   . Essential hypertension   . Indigestion   . Ischemic cardiomyopathy    a. 12/2017 Echo: EF 40-45%, diff HK, Gr1 DD, mild LVH. Mild AI. Mildly dil LA. Nl RV size/fxn. PASP 40-77m75m  . ThMarland Kitchenroid disease     Tobacco Use: Social History   Tobacco Use  Smoking Status Never Smoker  Smokeless Tobacco Never Used    Labs: Recent Review [employer]ist, physiologicalLabs for ITP Cardiac and Pulmonary Rehab Latest Ref Rng & Units 12/22/2017 12/23/2017   Cholestrol 0 - 200 mg/dL - 163   LDLCALC 0 - 99 mg/dL - 95   HDL >40 mg/dL - 32(L)   Trlycerides <150 mg/dL - 179(H)   Hemoglobin A1c 4.8 - 5.6 % 5.2 -       Exercise Target Goals: Exercise Program Goal: Individual exercise prescription set using results from initial 6 min walk test and THRR while considering  patient's activity barriers and safety.   Exercise Prescription Goal: Initial exercise prescription builds to 30-45 minutes a day of aerobic activity, 2-3 days per week.  Home exercise guidelines will be given to patient during program  as part of exercise prescription that the participant will acknowledge.  Activity Barriers & Risk Stratification: Activity Barriers & Cardiac Risk Stratification - 01/11/18 1336      Activity Barriers & Cardiac Risk Stratification   Activity Barriers  Arthritis;Muscular Weakness;Shortness of Breath;Deconditioning;Balance Concerns    Cardiac Risk Stratification  High       6 Minute Walk: 6 Minute Walk    Row Name 01/11/18 1448 03/09/18 1047       6 Minute Walk   Phase  Initial  Discharge    Distance  1325 feet  1600 feet    Distance % Change  -  20.7 %    Distance Feet Change  -  275 ft    Walk Time  6 minutes  6 minutes    # of Rest Breaks  0  0    MPH  2.51  3.03    METS  3.16   3.88    RPE  13  12    Perceived Dyspnea   -  1    VO2 Peak  11.04  13.6    Symptoms  No  Yes (comment)    Comments  -  SOB    Resting HR  83 bpm  92 bpm    Resting BP  128/70  122/70    Resting Oxygen Saturation   100 %  -    Exercise Oxygen Saturation  during 6 min walk  99 %  -    Max Ex. HR  92 bpm  125 bpm    Max Ex. BP  138/74  122/66    2 Minute Post BP  122/70  -       Oxygen Initial Assessment:   Oxygen Re-Evaluation:   Oxygen Discharge (Final Oxygen Re-Evaluation):   Initial Exercise Prescription: Initial Exercise Prescription - 01/11/18 1500      Date of Initial Exercise RX and Referring Provider   Date  01/11/18    Referring Provider  End, Harrell Gave MD      Treadmill   MPH  2.4    Grade  1    Minutes  15    METs  3.17      Recumbant Bike   Level  2    RPM  50    Watts  28    Minutes  15    METs  3      NuStep   Level  2    SPM  80    Minutes  15    METs  3      Prescription Details   Frequency (times per week)  2    Duration  Progress to 30 minutes of continuous aerobic without signs/symptoms of physical distress      Intensity   THRR 40-80% of Max Heartrate  109-135    Ratings of Perceived Exertion  11-13    Perceived Dyspnea  0-4      Progression   Progression  Continue to progress workloads to maintain intensity without signs/symptoms of physical distress.      Resistance Training   Training Prescription  Yes    Weight  3 lbs    Reps  10-15       Perform Capillary Blood Glucose checks as needed.  Exercise Prescription Changes: Exercise Prescription Changes    Row Name 01/11/18 1300 01/19/18 1100 01/26/18 1000 02/02/18 1100 02/16/18 1600     Response to Exercise   Blood Pressure (Admit)  128/70  132/64  -  130/70  98/50   Blood Pressure (Exercise)  138/74  164/78  -  152/70  126/66   Blood Pressure (Exit)  122/70  122/72  -  122/56  112/64   Heart Rate (Admit)  83 bpm  73 bpm  -  130 bpm  76 bpm   Heart Rate (Exercise)   92 bpm  145 bpm  -  130 bpm  108 bpm   Heart Rate (Exit)  82 bpm  104 bpm  -  89 bpm  75 bpm   Oxygen Saturation (Admit)  100 %  -  -  -  -   Oxygen Saturation (Exercise)  99 %  -  -  -  -   Rating of Perceived Exertion (Exercise)  13  15  -  13  13   Symptoms  walk test results  none  -  none  none   Comments  -  first full day of exercise   -  -  -   Duration  -  Continue with 30 min of aerobic exercise without signs/symptoms of physical distress.  -  Continue with 30 min of aerobic exercise without signs/symptoms of physical distress.  Continue with 30 min of aerobic exercise without signs/symptoms of physical distress.   Intensity  -  THRR unchanged  -  THRR unchanged  THRR unchanged     Progression   Progression  -  Continue to progress workloads to maintain intensity without signs/symptoms of physical distress.  -  Continue to progress workloads to maintain intensity without signs/symptoms of physical distress.  Continue to progress workloads to maintain intensity without signs/symptoms of physical distress.   Average METs  -  2.79  -  2.79  3.08     Resistance Training   Training Prescription  -  Yes  -  Yes  Yes   Weight  -  3 lbs  -  3 lbs  3 lbs   Reps  -  10-15  -  10-15  10-15     Interval Training   Interval Training  -  -  -  -  No     Treadmill   MPH  -  1.6  -  1.6  2   Grade  -  1  -  1  1   Minutes  -  15  -  15  15   METs  -  2.45  -  2.45  2.81     Recumbant Bike   Level  -  2  -  -  2   Watts  -  28  -  -  26   Minutes  -  15  -  -  15   METs  -  3.14  -  -  3.05     NuStep   Level  -  -  -  2  2   Minutes  -  -  -  15  15   METs  -  -  -  3  3.4     Home Exercise Plan   Plans to continue exercise at  -  -  Home (comment) walking  Home (comment)  Home (comment)   Frequency  -  -  Add 3 additional days to program exercise sessions.  Add 3 additional days to program exercise sessions.  Add 3 additional days to program exercise sessions.   Initial Home  Exercises  Provided  -  -  01/26/18  01/26/18  01/26/18   Row Name 03/03/18 0900             Response to Exercise   Blood Pressure (Admit)  128/68       Blood Pressure (Exercise)  132/70       Blood Pressure (Exit)  126/66       Heart Rate (Admit)  84 bpm       Heart Rate (Exercise)  140 bpm       Heart Rate (Exit)  102 bpm       Rating of Perceived Exertion (Exercise)  14       Symptoms  none       Duration  Continue with 30 min of aerobic exercise without signs/symptoms of physical distress.       Intensity  THRR unchanged         Progression   Progression  Continue to progress workloads to maintain intensity without signs/symptoms of physical distress.       Average METs  3.19         Resistance Training   Training Prescription  Yes       Weight  3 lbs       Reps  10-15         Interval Training   Interval Training  No         Treadmill   MPH  2       Grade  15       Minutes  15       METs  2.81         Recumbant Bike   Level  2       Watts  29       Minutes  15       METs  3.76         NuStep   Level  2       Minutes  15       METs  3.5         Home Exercise Plan   Plans to continue exercise at  Home (comment)       Frequency  Add 3 additional days to program exercise sessions.       Initial Home Exercises Provided  01/26/18          Exercise Comments: Exercise Comments    Row Name 01/19/18 0940 03/09/18 1400         Exercise Comments  First full day of exercise!  Patient was oriented to gym and equipment including functions, settings, policies, and procedures.  Patient's individual exercise prescription and treatment plan were reviewed.  All starting workloads were established based on the results of the 6 minute walk test done at initial orientation visit.  The plan for exercise progression was also introduced and progression will be customized based on patient's performance and goals.   Lonzell graduated today from  rehab with 29 sessions completed.   Details of the patient's exercise prescription and what He needs to do in order to continue the prescription and progress were discussed with patient.  Patient was given a copy of prescription and goals.  Patient verbalized understanding.  Jaegar plans to continue to exercise by walking at home more.         Exercise Goals and Review: Exercise Goals    Row Name 01/11/18 1504             Exercise  Goals   Increase Physical Activity  Yes       Intervention  Provide advice, education, support and counseling about physical activity/exercise needs.;Develop an individualized exercise prescription for aerobic and resistive training based on initial evaluation findings, risk stratification, comorbidities and participant's personal goals.       Expected Outcomes  Short Term: Attend rehab on a regular basis to increase amount of physical activity.;Long Term: Add in home exercise to make exercise part of routine and to increase amount of physical activity.;Long Term: Exercising regularly at least 3-5 days a week.       Increase Strength and Stamina  Yes       Intervention  Provide advice, education, support and counseling about physical activity/exercise needs.;Develop an individualized exercise prescription for aerobic and resistive training based on initial evaluation findings, risk stratification, comorbidities and participant's personal goals.       Expected Outcomes  Short Term: Increase workloads from initial exercise prescription for resistance, speed, and METs.;Short Term: Perform resistance training exercises routinely during rehab and add in resistance training at home;Long Term: Improve cardiorespiratory fitness, muscular endurance and strength as measured by increased METs and functional capacity (6MWT)       Able to understand and use rate of perceived exertion (RPE) scale  Yes       Intervention  Provide education and explanation on how to use RPE scale       Expected Outcomes  Short Term:  Able to use RPE daily in rehab to express subjective intensity level;Long Term:  Able to use RPE to guide intensity level when exercising independently       Knowledge and understanding of Target Heart Rate Range (THRR)  Yes       Intervention  Provide education and explanation of THRR including how the numbers were predicted and where they are located for reference       Expected Outcomes  Short Term: Able to state/look up THRR;Short Term: Able to use daily as guideline for intensity in rehab;Long Term: Able to use THRR to govern intensity when exercising independently       Able to check pulse independently  Yes       Intervention  Provide education and demonstration on how to check pulse in carotid and radial arteries.;Review the importance of being able to check your own pulse for safety during independent exercise       Expected Outcomes  Short Term: Able to explain why pulse checking is important during independent exercise;Long Term: Able to check pulse independently and accurately       Understanding of Exercise Prescription  Yes       Intervention  Provide education, explanation, and written materials on patient's individual exercise prescription       Expected Outcomes  Short Term: Able to explain program exercise prescription;Long Term: Able to explain home exercise prescription to exercise independently          Exercise Goals Re-Evaluation : Exercise Goals Re-Evaluation    Row Name 01/19/18 0940 01/26/18 1044 02/02/18 1158 02/11/18 0947 02/16/18 1623     Exercise Goal Re-Evaluation   Exercise Goals Review  Increase Physical Activity;Able to understand and use rate of perceived exertion (RPE) scale;Knowledge and understanding of Target Heart Rate Range (THRR);Understanding of Exercise Prescription;Increase Strength and Stamina  Increase Physical Activity;Able to understand and use rate of perceived exertion (RPE) scale;Knowledge and understanding of Target Heart Rate Range  (THRR);Understanding of Exercise Prescription;Increase Strength and Stamina  Increase Physical Activity;Able  to understand and use rate of perceived exertion (RPE) scale;Knowledge and understanding of Target Heart Rate Range (THRR);Understanding of Exercise Prescription;Increase Strength and Stamina  Increase Physical Activity;Increase Strength and Stamina;Understanding of Exercise Prescription  Increase Physical Activity;Increase Strength and Stamina;Understanding of Exercise Prescription   Comments  Reviewed RPE scale, THR and program prescription with pt today.  Pt voiced understanding and was given a copy of goals to take home.  Reviewed home exercise with pt today.  Pt plans to walk outside at home for exercise.  Reviewed THR, pulse, RPE, sign and symptoms, NTG use, and when to call 911 or MD.  Also discussed weather considerations and indoor options.  Pt voiced understanding.  Depaul is doing well in rehab. He is getting stronger and becoming more comfortable with exercise. We will continue to monitor progress and increase workloads as tolerated.   Tevis is doing well in exercise.  He is walking for 41mn  and doing weights at home for exercise. Talked about expanding time to 30 min of walking.   His strength and stamina have been about the same.    HShykeemhas been doing well in rehab.  He up to 2.0 mph now on the treadmill.  We will continue to monitor his progress.    Expected Outcomes  Short: Use RPE daily to regulate intensity. Long: Follow program prescription in THR.  Short: add an additonal 3 days of exercise at home. Long: become comfortable exercising on his own   Short: continue to attend rehab regularly Long: increase overall MET level   Short: Increase to 371m walking at home. 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  Long: Continue to rebuild strength, stamina, and confidence back.    RoEast Carondeletame 03/03/18 0920 03/09/18 1009            Exercise Goal Re-Evaluation   Exercise Goals Review  Increase Physical Activity;Increase Strength and Stamina;Understanding of Exercise Prescription  Increase Physical Activity;Increase Strength and Stamina;Understanding of Exercise Prescription      Comments  HoAlimontinues to do well in rehab.  He is up to 29 watts on the recumbent bike.  We will continue to monitor his progress.   HoWyndells doing well in rehab.  He is walking some at home for 10-15 min. We talked about increasing to 30 min or doing the 10-1566mmultiple times a day.  He is still feeling fatigued and weak.       Expected Outcomes  Short: Continue to move up workloads.  Long: Continue to exercise more at home.   Short: Increase walking at home.  Long: Continue to build strength and stamina.          Discharge Exercise Prescription (Final Exercise Prescription Changes): Exercise Prescription Changes - 03/03/18 0900      Response to Exercise   Blood Pressure (Admit)  128/68    Blood Pressure (Exercise)  132/70    Blood Pressure (Exit)  126/66    Heart Rate (Admit)  84 bpm    Heart Rate (Exercise)  140 bpm    Heart Rate (Exit)  102 bpm    Rating of Perceived Exertion (Exercise)  14    Symptoms  none    Duration  Continue with 30 min of aerobic exercise without signs/symptoms of physical distress.    Intensity  THRR unchanged      Progression   Progression  Continue to progress workloads to maintain intensity without  signs/symptoms of physical distress.    Average METs  3.19      Resistance Training   Training Prescription  Yes    Weight  3 lbs    Reps  10-15      Interval Training   Interval Training  No      Treadmill   MPH  2    Grade  15    Minutes  15    METs  2.81      Recumbant Bike   Level  2    Watts  29    Minutes  15    METs  3.76      NuStep   Level  2    Minutes  15    METs  3.5      Home Exercise Plan   Plans to continue exercise at  Home (comment)    Frequency  Add 3 additional days  to program exercise sessions.    Initial Home Exercises Provided  01/26/18       Nutrition:  Target Goals: Understanding of nutrition guidelines, daily intake of sodium <1555m, cholesterol <2064m calories 30% from fat and 7% or less from saturated fats, daily to have 5 or more servings of fruits and vegetables.  Biometrics: Pre Biometrics - 01/11/18 1504      Pre Biometrics   Height  5' 11.6" (1.819 m)    Weight  169 lb 9.6 oz (76.9 kg)    Waist Circumference  34.5 inches    Hip Circumference  35.5 inches    Waist to Hip Ratio  0.97 %    BMI (Calculated)  23.25    Single Leg Stand  2.88 seconds      Post Biometrics - 03/09/18 1048       Post  Biometrics   Height  5' 11.6" (1.819 m)    Weight  163 lb (73.9 kg)    Waist Circumference  34.5 inches    Hip Circumference  36 inches    Waist to Hip Ratio  0.96 %    BMI (Calculated)  22.35    Single Leg Stand  3.81 seconds       Nutrition Therapy Plan and Nutrition Goals: Nutrition Therapy & Goals - 02/02/18 1137      Nutrition Therapy   Diet  TLC    Drug/Food Interactions  Statins/Certain Fruits    Protein (specify units)  9oz    Fiber  35 grams    Whole Grain Foods  3 servings   typically eats white/wheat or Fr[place]   Saturated Fats  16 max. grams    Fruits and Vegetables  6 servings/day   8 ideal, working to increase fruit and vegetable intake   Sodium  1500 grams      Personal Nutrition Goals   Nutrition Goal  Continue to work on eating less fried foods, less full-fat dairy, more fish and more fruits & vegetables    Personal Goal #2  Start to become familiar with reading nutrition facts labels, paying particular attention to sodium, total fat, types of fat and added sugar    Personal Goal #3  Great job with starting to eat breakfast! Continue this practice even when you are no longer attending our classes    Personal Goal #4  Swap your sweets; choose fruit rather than cookies as snacks more often    Comments   He has made several changes to his dietary habits since having a heart attack. Lunch  is his largest meal of the day. He has lost approx 30# in 3 months and would like to gain some weight      Intervention Plan   Intervention  Prescribe, educate and counsel regarding individualized specific dietary modifications aiming towards targeted core components such as weight, hypertension, lipid management, diabetes, heart failure and other comorbidities.;Nutrition handout(s) given to patient.   gave heart healthy eating handout   Expected Outcomes  Short Term Goal: Understand basic principles of dietary content, such as calories, fat, sodium, cholesterol and nutrients.;Long Term Goal: Adherence to prescribed nutrition plan.;Short Term Goal: A plan has been developed with personal nutrition goals set during dietitian appointment.       Nutrition Assessments: Nutrition Assessments - 03/09/18 1401      MEDFICTS Scores   Pre Score  32    Post Score  47    Score Difference  15       Nutrition Goals Re-Evaluation: Nutrition Goals Re-Evaluation    Row Name 02/02/18 1201 02/11/18 0952 03/04/18 1209         Goals   Nutrition Goal  Continue to work on eating less fried foods, less full-fat dairy, more fish and more fruits & vegetables  Less fried food, lower fat dairy, more fish, fruits and vegetables, reading labels.   Continue to work on eating less fried foods, less full fat dairy, and increasing fruit, fish and vegetable intake. Become more familiar with reading food labels and continue to eat breakfast regularly; eating breakfast will provide additional calories required to promote wt gain     Comment  He has made several changes to his dietary habits s/p heart attack. He has purchased a [employer] to [employer] by methods other than frying and chooses lower fat milk as well. He has started to swap some sweets for fruits as snacks  He has cut back on fried foods and are cooking differently.   They are trying to eat more fish and leaner meats.  He has cut back on fat and watching sodium. He is reading his food labels a little more closely now.  He has started to add in more fruits and vegetables now.   He has been eating less fried food and has been focusing mainly on eating more vegetables. He has been reading food labels more consistently and continues not to drink sodas (x6 months). His most recent BMP reflected labs WNL per his report though he also reports being seen for dehydration recently and needing to increase his daily fluid intake     Expected Outcome  He will eat a diet that includes low fat dairy, non-fried lean meats, and regular consumption of fruits and vegetables  Short: Continue to work on alternative cooking methods versus frying.  Long: Continue to follow heart healthy guidelines.   He will continue to eat breakfast daily, choose less fried food options and eat fruits and vegetables more often. He will also work to drink at least one more glass of water per day which he may flavor with lemon/ cucumber etc to help stimulate thirst       Personal Goal #2 Re-Evaluation   Personal Goal #2  Start to become familiar with reading nutrition facts labels, paying particular attention to sodium, total fat, types of fat and added sugar He has been trying to eat less salt but has not yet started reading food labels  -  -       Personal Goal #3 Re-Evaluation  Personal Goal #3  Great job with starting to eat breakfast! Continue this practice even when you are no longer attending our classes  -  -       Personal Goal #4 Re-Evaluation   Personal Goal #4  Swap your sweets; choose fruit rather than cookes as snacks more often  -  -        Nutrition Goals Discharge (Final Nutrition Goals Re-Evaluation): Nutrition Goals Re-Evaluation - 03/04/18 1209      Goals   Nutrition Goal  Continue to work on eating less fried foods, less full fat dairy, and increasing fruit, fish and vegetable  intake. Become more familiar with reading food labels and continue to eat breakfast regularly; eating breakfast will provide additional calories required to promote wt gain    Comment  He has been eating less fried food and has been focusing mainly on eating more vegetables. He has been reading food labels more consistently and continues not to drink sodas (x6 months). His most recent BMP reflected labs WNL per his report though he also reports being seen for dehydration recently and needing to increase his daily fluid intake    Expected Outcome  He will continue to eat breakfast daily, choose less fried food options and eat fruits and vegetables more often. He will also work to drink at least one more glass of water per day which he may flavor with lemon/ cucumber etc to help stimulate thirst       Psychosocial: Target Goals: Acknowledge presence or absence of significant depression and/or stress, maximize coping skills, provide positive support system. Participant is able to verbalize types and ability to use techniques and skills needed for reducing stress and depression.   Initial Review & Psychosocial Screening: Initial Psych Review & Screening - 01/11/18 1323      Initial Review   Current issues with  Current Depression;Current Stress Concerns;Current Sleep Concerns;Current Anxiety/Panic    Source of Stress Concerns  Unable to perform yard/household activities;Unable to participate in former interests or hobbies;Poor Coping Skills;Financial    Comments  Ever since April, Davante has had memory issues, unintentional weight loss (30 lbs), issues sleeping and more anxiety. They think he possibly could have had a mini stroke, but no tests showed those results. He hasn't slept through the night since April. His family has noticed issues with his ability to process information.       Family Dynamics   Good Support System?  Yes   Wife, family, friends, Doristine Bosworth     Barriers   Psychosocial barriers  to participate in program  There are no identifiable barriers or psychosocial needs.;The patient should benefit from training in stress management and relaxation.      Screening Interventions   Interventions  Encouraged to exercise;Provide feedback about the scores to participant;Program counselor consult;To provide support and resources with identified psychosocial needs    Expected Outcomes  Short Term goal: Utilizing psychosocial counselor, staff and physician to assist with identification of specific Stressors or current issues interfering with healing process. Setting desired goal for each stressor or current issue identified.;Short Term goal: Identification and review with participant of any Quality of Life or Depression concerns found by scoring the questionnaire.;Long Term Goal: Stressors or current issues are controlled or eliminated.;Long Term goal: The participant improves quality of Life and PHQ9 Scores as seen by post scores and/or verbalization of changes       Quality of Life Scores:  Quality of Life - 03/09/18 1404  Quality of Life   Select  Quality of Life      Quality of Life Scores   Health/Function Pre  19.2 %    Health/Function Post  13.37 %    Health/Function % Change  -30.36 %    Socioeconomic Pre  20.67 %    Socioeconomic Post  20.14 %    Socioeconomic % Change   -2.56 %    Psych/Spiritual Pre  20.5 %    Psych/Spiritual Post  14.79 %    Psych/Spiritual % Change  -27.85 %    Family Pre  28.8 %    Family Post  26.4 %    Family % Change  -8.33 %    GLOBAL Pre  21.2 %    GLOBAL Post  16.97 %    GLOBAL % Change  -19.95 %      Scores of 19 and below usually indicate a poorer quality of life in these areas.  A difference of  2-3 points is a clinically meaningful difference.  A difference of 2-3 points in the total score of the Quality of Life Index has been associated with significant improvement in overall quality of life, self-image, physical symptoms, and  general health in studies assessing change in quality of life.  PHQ-9: Recent Review Flowsheet Data    Depression screen [facility] 2/9 03/09/2018 02/09/2018 01/11/2018   Decreased Interest 2 2 3    Down, Depressed, Hopeless 2 2 3    PHQ - 2 Score 4 4 6    Altered sleeping 2 2 3    Tired, decreased energy 3 2 3    Change in appetite 2 2 3    Feeling bad or failure about yourself  2 1 2    Trouble concentrating 2 1 2    Moving slowly or fidgety/restless 2 2 2    Suicidal thoughts 0 0 0   PHQ-9 Score 17 14 21    Difficult doing work/chores - Somewhat difficult Somewhat difficult     Interpretation of Total Score  Total Score Depression Severity:  1-4 = Minimal depression, 5-9 = Mild depression, 10-14 = Moderate depression, 15-19 = Moderately severe depression, 20-27 = Severe depression   Psychosocial Evaluation and Intervention: Psychosocial Evaluation - 01/28/18 1004      Psychosocial Evaluation & Interventions   Interventions  Relaxation education;Stress management education;Encouraged to exercise with the program and follow exercise prescription    Comments  Counselor met with Mr. [facility]) today for initial psychosocial evaluation.  He is a 72 year old who had a heart attack and stent inserted in July.  He has a strong support system with a spouse of 7 years; (3) adult sons; and a sister-in-law who live locally.  Tyresse has stage 3 kidney disease as well as his heart condition.  He reports not sleeping well since the spring and his Dr. just ordered an increase in one of his medications to begin today for this.  He reports a limited appetite for the past several years.  Abe People denies a history of depression or anxiety but admits he has some depressive symptoms once he attended the psychoeducational component on this topic and realized typical symptoms.  His PHQ-9 score is a "21" indicating severe symptoms.  Counselor mentioned seeing a Dr. or counselor about this and Travonte was hesitant to consider this  without speaking with his spouse and also due to financial reasons.  Mallory reports his primary stressors are that he cannot do normal activities; he is selling his house and moving soon; finances;  his health and generally feeling "overwhelmed."  Viyaan has goals to increase his stamina and strength and to be more educated on healthy diet and exercise while in this program.  Counselor will follow with him on sleep and depressive symptoms.    Expected Outcomes  Short:  Atha will begin the increased medication today for sleep in hopes that it will help with this situation.  He will decrease length naps and caffeine during the day as discussed.  Temiloluwa will attend and exercise consistently for his health and mental health/coping strategies.  Long:  Trysten will develop a routine of positive self-care;including exercise and stress management strategies.    Continue Psychosocial Services   Follow up required by counselor       Psychosocial Re-Evaluation: Psychosocial Re-Evaluation    [place] Name 02/11/18 0949 02/16/18 1108 02/25/18 1039 03/09/18 1015       Psychosocial Re-Evaluation   Current issues with  Current Sleep Concerns;Current Stress Concerns  Current Psychotropic Meds;Current Sleep Concerns;Current Stress Concerns  Current Psychotropic Meds;Current Anxiety/Panic;Current Depression;Current Sleep Concerns;Current Stress Concerns  Current Psychotropic Meds;Current Anxiety/Panic;Current Depression;Current Sleep Concerns;Current Stress Concerns    Comments  Kelby is doing well in rehab.  He has changed his meds for sleeping and he is sleeping better.  He has been able to cut back on his naps and not napping after 3pm.   He has been exercsining at home.  He continues to still have a foggy feeling and thinks it may be related to some of his medications.  He is trying to work on his self care.   Counselor follow up with Mr. & Ms. Willet today (met individually).  Ms. Wessinger is concerned about her spouse as he  is not sleeping; is more irritable; is more anxious; has unintentional weight loss with no appetite and is becoming more difficult for her to "calm 'him' down.  They are in the process of moving out of a house and into an apartment and this is very stressful as well.  Ms. Colberg reports Jawon is "obsessing" over every little thing and is unable to relax.  She reports he is supposed to be taking medication for sleep/anxiety prescribed by his Dr, but is refusing to do as directed.  Counselor met with Mr. Strader after providing an education class on sleep hygiene.  He reports ongoing problems with sleep even after decreasing his caffeine intake - and recognizes he is sleeping too much during the day (napping).  He admits to not taking his medications as directed and agreed to do so for the next 9 days until counselor meets with him again.  Ms. Stidd is also trying to move up the sleep study from later this month to sooner- if possible.   Counselor will follow with Mr. Lortie and his spouse.  Counselor follow with Mr. and Ms. Lienemann today (individually) with both reporting things are somewhat better with Mr. Evilsizer taking his medications for mood as directed morning and evening; less anxious and more calm; sleeping a little better; and his appetite has improved a bit - although he continues to lose weight.  His Dr. has followed with him and he had a sleep study recently - no results back yet.  He is scheduled for a colonoscopy soon due to some concerns.  Ms. Honeycutt is setting better limits with others and is taking better care of herself with putting the sell of their home on hold during all these transitions.  Counselor commended both for this progress  and positive steps.  Kashis continues to struggle with weight loss and not feeling well due to his health.  He is doing his post 6MWT today.  His wife is also starting to have some health problem.s  He would like to discontinue from the program.   He is on visit 29 today.  He is  still very confused with everything.     Expected Outcomes  Short: Continue to work on sleep habits.  Long: Continue to practice positive self care.   Short:  Tanya will take his medications as directed by Dr. for the next 9 days to see if his sleep and mood improve.  He will call his Dr. if there are severe negative side effects.  Ms. Maynez will try to get the sleep study scheduled more quickly.  Counselor will follow with him and Ms. Antuna.  Long:  Practice positive self care and stress management - as well as sleep hygiene.  Short:  Mr. Loletha Grayer will continue to take his medicaitons as directed by the Dr. for mood and sleep.  He will follow Dr's orders re: the outcome of the sleep study and may speak with him about a change of psychotropic medications if they are not being more effective at that time.  He will continue to exercise and eat 5 meals a day as directed to regain his strength.  Long:  Mr. and Ms. Sperl will develop a positive lifestyle habit including exercise and stress management strategies.    -    Interventions  Encouraged to attend Cardiac Rehabilitation for the exercise;Stress management education  -  -  -    Continue Psychosocial Services   Follow up required by staff  -  -  -    Comments  Ever since April, Alic has had memory issues, unintentional weight loss (30 lbs), issues sleeping and more anxiety. They think he possibly could have had a mini stroke, but no tests showed those results. He hasn't slept through the night since April. His family has noticed issues with his ability to process information.   -  -  -      Initial Review   Source of Stress Concerns  Unable to perform yard/household activities;Unable to participate in former interests or hobbies;Poor Coping Skills;Financial  -  -  -       Psychosocial Discharge (Final Psychosocial Re-Evaluation): Psychosocial Re-Evaluation - 03/09/18 1015      Psychosocial Re-Evaluation   Current issues with  Current Psychotropic  Meds;Current Anxiety/Panic;Current Depression;Current Sleep Concerns;Current Stress Concerns    Comments  Duke continues to struggle with weight loss and not feeling well due to his health.  He is doing his post 6MWT today.  His wife is also starting to have some health problem.s  He would like to discontinue from the program.   He is on visit 29 today.  He is still very confused with everything.        Vocational Rehabilitation: Provide vocational rehab assistance to qualifying candidates.   Vocational Rehab Evaluation & Intervention: Vocational Rehab - 01/11/18 1334      Initial Vocational Rehab Evaluation & Intervention   Assessment shows need for Vocational Rehabilitation  No       Education: Education Goals: Education classes will be provided on a variety of topics geared toward better understanding of heart health and risk factor modification. Participant will state understanding/return demonstration of topics presented as noted by education test scores.  Learning Barriers/Preferences: Learning Barriers/Preferences - 01/11/18  42      Learning Barriers/Preferences   Learning Barriers  Exercise Concerns    Learning Preferences  Verbal Instruction;Written Material       Education Topics:  AED/CPR: - Group verbal and written instruction with the use of models to demonstrate the basic use of the AED with the basic ABC's of resuscitation.   Cardiac Rehab from 03/09/2018 in [facility] Cardiac and Pulmonary Rehab  Date  03/02/18  Educator  KS  Instruction Review Code  1- Verbalizes Understanding      General Nutrition Guidelines/Fats and Fiber: -Group instruction provided by verbal, written material, models and posters to present the general guidelines for heart healthy nutrition. Gives an explanation and review of dietary fats and fiber.   Cardiac Rehab from 03/09/2018 in [facility] Cardiac and Pulmonary Rehab  Date  02/23/18  Educator  LB  Instruction Review Code  1- Verbalizes  Understanding      Controlling Sodium/Reading Food Labels: -Group verbal and written material supporting the discussion of sodium use in heart healthy nutrition. Review and explanation with models, verbal and written materials for utilization of the food label.   Cardiac Rehab from 03/09/2018 in [facility] Cardiac and Pulmonary Rehab  Date  03/04/18  Educator  LB  Instruction Review Code  1- Verbalizes Understanding      Exercise Physiology & General Exercise Guidelines: - Group verbal and written instruction with models to review the exercise physiology of the cardiovascular system and associated critical values. Provides general exercise guidelines with specific guidelines to those with heart or lung disease.    Aerobic Exercise & Resistance Training: - Gives group verbal and written instruction on the various components of exercise. Focuses on aerobic and resistive training programs and the benefits of this training and how to safely progress through these programs..   Cardiac Rehab from 03/09/2018 in [facility] Cardiac and Pulmonary Rehab  Date  01/21/18  Educator  AS  Instruction Review Code  1- Verbalizes Understanding      Flexibility, Balance, Mind/Body Relaxation: Provides group verbal/written instruction on the benefits of flexibility and balance training, including mind/body exercise modes such as yoga, pilates and tai chi.  Demonstration and skill practice provided.   Cardiac Rehab from 03/09/2018 in [facility] Cardiac and Pulmonary Rehab  Date  01/26/18  Educator  AS  Instruction Review Code  1- Verbalizes Understanding      Stress and Anxiety: - Provides group verbal and written instruction about the health risks of elevated stress and causes of high stress.  Discuss the correlation between heart/lung disease and anxiety and treatment options. Review healthy ways to manage with stress and anxiety.   Cardiac Rehab from 03/09/2018 in [facility] Cardiac and Pulmonary Rehab  Date  02/02/18   Educator  [facility]  Instruction Review Code  1- Verbalizes Understanding      Depression: - Provides group verbal and written instruction on the correlation between heart/lung disease and depressed mood, treatment options, and the stigmas associated with seeking treatment.   Cardiac Rehab from 03/09/2018 in [facility] Cardiac and Pulmonary Rehab  Date  01/19/18  Educator  [facility]  Instruction Review Code  1- Verbalizes Understanding      Anatomy & Physiology of the Heart: - Group verbal and written instruction and models provide basic cardiac anatomy and physiology, with the coronary electrical and arterial systems. Review of Valvular disease and Heart Failure   Cardiac Rehab from 03/09/2018 in [facility] Cardiac and Pulmonary Rehab  Date  02/04/18  Educator  CE  Instruction Review Code  1- Verbalizes Understanding      Cardiac Procedures: - Group verbal and written instruction to review commonly prescribed medications for heart disease. Reviews the medication, class of the drug, and side effects. Includes the steps to properly store meds and maintain the prescription regimen. (beta blockers and nitrates)   Cardiac Rehab from 03/09/2018 in [facility] Cardiac and Pulmonary Rehab  Date  02/25/18  Educator  CE  Instruction Review Code  1- Verbalizes Understanding      Cardiac Medications I: - Group verbal and written instruction to review commonly prescribed medications for heart disease. Reviews the medication, class of the drug, and side effects. Includes the steps to properly store meds and maintain the prescription regimen.   Cardiac Rehab from 03/09/2018 in [facility] Cardiac and Pulmonary Rehab  Date  02/09/18  Educator  SB  Instruction Review Code  1- Verbalizes Understanding      Cardiac Medications II: -Group verbal and written instruction to review commonly prescribed medications for heart disease. Reviews the medication, class of the drug, and side effects. (all other drug classes)   Cardiac Rehab from  03/09/2018 in [facility] Cardiac and Pulmonary Rehab  Date  01/28/18  Educator  CE  Instruction Review Code  1- Verbalizes Understanding       Go Sex-Intimacy & Heart Disease, Get SMART - Goal Setting: - Group verbal and written instruction through game format to discuss heart disease and the return to sexual intimacy. Provides group verbal and written material to discuss and apply goal setting through the application of the S.M.A.R.T. Method.   Cardiac Rehab from 03/09/2018 in [facility] Cardiac and Pulmonary Rehab  Date  02/25/18  Educator  CE  Instruction Review Code  1- Verbalizes Understanding      Other Matters of the Heart: - Provides group verbal, written materials and models to describe Stable Angina and Peripheral Artery. Includes description of the disease process and treatment options available to the cardiac patient.   Cardiac Rehab from 03/09/2018 in [facility] Cardiac and Pulmonary Rehab  Date  02/04/18  Educator  CE  Instruction Review Code  1- Verbalizes Understanding      Exercise & Equipment Safety: - Individual verbal instruction and demonstration of equipment use and safety with use of the equipment.   Cardiac Rehab from 03/09/2018 in [facility] Cardiac and Pulmonary Rehab  Date  01/11/18  Educator  [facility]  Instruction Review Code  1- Verbalizes Understanding      Infection Prevention: - Provides verbal and written material to individual with discussion of infection control including proper hand washing and proper equipment cleaning during exercise session.   Falls Prevention: - Provides verbal and written material to individual with discussion of falls prevention and safety.   Cardiac Rehab from 03/09/2018 in [facility] Cardiac and Pulmonary Rehab  Date  01/11/18  Educator  [facility]  Instruction Review Code  1- Verbalizes Understanding      Diabetes: - Individual verbal and written instruction to review signs/symptoms of diabetes, desired ranges of glucose level fasting, after meals and  with exercise. Acknowledge that pre and post exercise glucose checks will be done for 3 sessions at entry of program.   Know Your Numbers and Risk Factors: -Group verbal and written instruction about important numbers in your health.  Discussion of what are risk factors and how they play a role in the disease process.  Review of Cholesterol, Blood Pressure, Diabetes, and BMI and the role they play in your overall health.  Cardiac Rehab from 03/09/2018 in [facility] Cardiac and Pulmonary Rehab  Date  01/28/18  Educator  CE  Instruction Review Code  1- Verbalizes Understanding      Sleep Hygiene: -Provides group verbal and written instruction about how sleep can affect your health.  Define sleep hygiene, discuss sleep cycles and impact of sleep habits. Review good sleep hygiene tips.    Cardiac Rehab from 03/09/2018 in [facility] Cardiac and Pulmonary Rehab  Date  02/16/18  Educator  [facility]  Instruction Review Code  1- Verbalizes Understanding      Other: -Provides group and verbal instruction on various topics (see comments)   Knowledge Questionnaire Score: Knowledge Questionnaire Score - 03/09/18 1048      Knowledge Questionnaire Score   Pre Score  24/26    Post Score  22/26   test reviewed with pt today      Core Components/Risk Factors/Patient Goals at Admission: Personal Goals and Risk Factors at Admission - 01/11/18 1320      Core Components/Risk Factors/Patient Goals on Admission    Weight Management  Yes;Weight Gain    Intervention  Weight Management: Develop a combined nutrition and exercise program designed to reach desired caloric intake, while maintaining appropriate intake of nutrient and fiber, sodium and fats, and appropriate energy expenditure required for the weight goal.;Weight Management: Provide education and appropriate resources to help participant work on and attain dietary goals.    Admit Weight  169 lb 9.6 oz (76.9 kg)    Goal Weight: Short Term  174 lb (78.9 kg)     Goal Weight: Long Term  180 lb (81.6 kg)    Expected Outcomes  Short Term: Continue to assess and modify interventions until short term weight is achieved;Long Term: Adherence to nutrition and physical activity/exercise program aimed toward attainment of established weight goal;Weight Gain: Understanding of general recommendations for a high calorie, high protein meal plan that promotes weight gain by distributing calorie intake throughout the day with the consumption for 4-5 meals, snacks, and/or supplements;Understanding of distribution of calorie intake throughout the day with the consumption of 4-5 meals/snacks;Understanding recommendations for meals to include 15-35% energy as protein, 25-35% energy from fat, 35-60% energy from carbohydrates, less than 236m of dietary cholesterol, 20-35 gm of total fiber daily    Hypertension  Yes    Intervention  Provide education on lifestyle modifcations including regular physical activity/exercise, weight management, moderate sodium restriction and increased consumption of fresh fruit, vegetables, and low fat dairy, alcohol moderation, and smoking cessation.;Monitor prescription use compliance.    Expected Outcomes  Short Term: Continued assessment and intervention until BP is < 140/920mHG in hypertensive participants. < 130/8035mG in hypertensive participants with diabetes, heart failure or chronic kidney disease.;Long Term: Maintenance of blood pressure at goal levels.    Lipids  Yes    Intervention  Provide education and support for participant on nutrition & aerobic/resistive exercise along with prescribed medications to achieve LDL <65m75mDL >40mg74m Expected Outcomes  Short Term: Participant states understanding of desired cholesterol values and is compliant with medications prescribed. Participant is following exercise prescription and nutrition guidelines.;Long Term: Cholesterol controlled with medications as prescribed, with individualized exercise RX  and with personalized nutrition plan. Value goals: LDL < 65mg,54m > 40 mg.       Core Components/Risk Factors/Patient Goals Review:  Goals and Risk Factor Review    Row Name 02/11/18 0954 [id number]  Core Components/Risk Factors/Patient Goals Review   Personal Goals Review  Weight Management/Obesity;Hypertension;Lipids  Weight Management/Obesity;Hypertension;Lipids      Review  Jahvier's weight has remained steady.  He still wants to work on a gaining a little.  He is lifting weights and exercising. We talked making sure he is getting enough protein in his diet.  His blood pressures have been good in class and he is checking them at home.  His reading at home are a little higher in the evening, which he checks abour 2-3x week.  He is also monitoring his kidney functions.  He feels that his medications are working well for him.   Jacquan is losing some weight.  He went to doctor yesterday.  They are now looking into gastro issues. We talked about making sure he is exercising to maintain his lean body mass.  His blood pressures have been good.  He is doing well on most of his medications but has continued to stay tired, weak, confused.  They have made a lot of changes recently as well which is limiting.       Expected Outcomes  Short: Continue to work on gaining weight.  Long: Continue to monitor risk factors.   Short: Continue to work on weight gain. Long: Montior risk factors.          Core Components/Risk Factors/Patient Goals at Discharge (Final Review):  Goals and Risk Factor Review - 03/09/18 1011      Core Components/Risk Factors/Patient Goals Review   Personal Goals Review  Weight Management/Obesity;Hypertension;Lipids    Review  Izyk is losing some weight.  He went to doctor yesterday.  They are now looking into gastro issues. We talked about making sure he is exercising to maintain his lean body mass.  His blood pressures have been good.  He is doing well on most of his  medications but has continued to stay tired, weak, confused.  They have made a lot of changes recently as well which is limiting.     Expected Outcomes  Short: Continue to work on weight gain. Long: Montior risk factors.        ITP Comments: ITP Comments    Row Name 01/11/18 1316 01/27/18 0824 02/24/18 0602 03/09/18 1400     ITP Comments  Med review completed. Initial ITP created. Diagnosis can be found in [facility]7/9  30 day review completed. ITP sent to Dr. Ramonita Lab, covering for Dr. Emily Filbert, Medical Director of Cardiac Rehab. Continue with ITP unless changes are made by physician.  New to program  30 day review completed. ITP sent to Dr. Emily Filbert, Medical Director of Cardiac Rehab. Continue with ITP unless changes are made by physician  Discharge ITP sent and signed by Dr. Sabra Heck.  Discharge Summary routed to PCP and cardiologist.       Comments: Discharge ITP

## 2018-03-10 ENCOUNTER — Ambulatory Visit: Payer: Medicare Other | Admitting: Nurse Practitioner

## 2018-03-24 ENCOUNTER — Telehealth: Payer: Self-pay | Admitting: Internal Medicine

## 2018-03-24 NOTE — Telephone Encounter (Signed)
Pt c/o medication issue:  1. Name of Medication: ticagrelor (BRILINTA)   2. How are you currently taking this medication (dosage and times per day)? 1 tablet 2 times daily  3. Are you having a reaction (difficulty breathing--STAT)? Weak, cannot stand, balance is off, memory is off  4. What is your medication issue? Patient wife calling, states that patient saw Dot Lanes, a neurologist today and she asked that patient look into the medication Brilinta as it may be what is causing some issues.  Would like to know if there are any other options.  Please call to discuss.

## 2018-03-24 NOTE — Telephone Encounter (Signed)
Spoke with wife. Patient saw Neurologist at [facility] clinic today who advised the Brilinta may be the cause of patient's symptoms and to check with cardiology. Patient has had worsening memory, loss in ability to do simple things, and unable to stand.  He was like this a little prior to the NSTEMI and stent placement in July. It seems he got worse about the same time as starting the Brilinta.  Wife states patient is "in bad shape" several times.  Neurologist wanted them to check with cardiology for thoughts regarding Brilinta and side effects coming from that.  Otherwise, Neurology is planning to try xanax and prozac. Patient has appointment with Ward Givens, NP on 04/08/18.  Routing for advice.

## 2018-04-08 ENCOUNTER — Encounter: Payer: Self-pay | Admitting: Nurse Practitioner

## 2018-04-08 ENCOUNTER — Ambulatory Visit: Payer: Medicare Other | Admitting: Nurse Practitioner

## 2018-04-08 VITALS — BP 134/84 | HR 61 | Ht 72.0 in | Wt 157.5 lb

## 2018-04-08 DIAGNOSIS — R5382 Chronic fatigue, unspecified: Secondary | ICD-10-CM | POA: Diagnosis not present

## 2018-04-08 DIAGNOSIS — I2510 Atherosclerotic heart disease of native coronary artery without angina pectoris: Secondary | ICD-10-CM

## 2018-04-08 DIAGNOSIS — E785 Hyperlipidemia, unspecified: Secondary | ICD-10-CM

## 2018-04-08 DIAGNOSIS — I255 Ischemic cardiomyopathy: Secondary | ICD-10-CM | POA: Diagnosis not present

## 2018-04-08 DIAGNOSIS — N183 Chronic kidney disease, stage 3 unspecified: Secondary | ICD-10-CM

## 2018-04-08 DIAGNOSIS — I5022 Chronic systolic (congestive) heart failure: Secondary | ICD-10-CM | POA: Diagnosis not present

## 2018-04-08 DIAGNOSIS — I951 Orthostatic hypotension: Secondary | ICD-10-CM

## 2018-04-08 NOTE — Progress Notes (Signed)
Office Visit    Patient Name: Jesus Bolton Date of Encounter: 04/08/2018  Primary Care Provider:  Dione Housekeeper, MD Primary Cardiologist:  Yvonne Kendall, MD  Chief Complaint    Jesus Bolton is a 72 y.o. male with a history of CAD s/p NSTEMI (12/2017), ischemic cardiomyopathy, HTN, HLD, Stage III CKD, and BPH. He presents today for follow-up for fatigue, anxiety, weight loss and depression.  Past Medical History    Past Medical History:  Diagnosis Date  . BPH (benign prostatic hyperplasia)   . CAD (coronary artery disease)    a. 12/2017 NSTEMI/PCI: LM nl, LAD 40-50p, 20-54m, LCX nl, RCA 20-30ost/p, 51m (3.25x12 Xience Sierra DES - post-dil to 3.60mm), 20d.  . CKD (chronic kidney disease), stage III (HCC)   . Elevated PSA   . Essential hypertension   . Indigestion   . Ischemic cardiomyopathy    a. 12/2017 Echo: EF 40-45%, diff HK, Gr1 DD, mild LVH. Mild AI. Mildly dil LA. Nl RV size/fxn. PASP 40-35mmHg.  Marland Kitchen Thyroid disease    Past Surgical History:  Procedure Laterality Date  . CARDIAC CATHETERIZATION    . GREEN LIGHT LASER TURP (TRANSURETHRAL RESECTION OF PROSTATE N/A 01/02/2015   Procedure: GREEN LIGHT LASER TURP (TRANSURETHRAL RESECTION OF PROSTATE;  Surgeon: Orson Ape, MD;  Location: [facility] ORS;  Service: Urology;  Laterality: N/A;  . LEFT HEART CATH AND CORONARY ANGIOGRAPHY N/A 12/23/2017   Procedure: LEFT HEART CATH AND CORONARY ANGIOGRAPHY;  Surgeon: Yvonne Kendall, MD;  Location: [facility] INVASIVE CV LAB;  Service: Cardiovascular;  Laterality: N/A;   Allergies  Allergies  Allergen Reactions  . Sulfa Antibiotics Rash    History of Present Illness    72 y.o. male with the above complex medical history including CAD s/p NSTEMI in 12/2017 with drug-eluting stent placement to the mid RCA. EF was noted to be 40-45% at that time consistent with ischemic cardiomyopathy. He is also treated for HTN, HLD, Stage III CKD and BPH.  He was last seen in the clinic  September 16, at which time he was having similar symptoms, which seemed to be ongoing from before his cardiac catheterization including insomnia, fatigue, unintentional weight loss (6 lbs since 9/16), anxiety, shortness of breath and memory loss. At that time he also had orthostatic tachycardia with mild orthostatic hypotension.  I reduced his losartan from 50mg  to 25mg  daily @ that time.  Anxiety and depression have been worsening.  He saw a neurologist on 03/24/18 related to fatigue and was started on Prozac and Xanax without noticeable improvement thus far.  Since his previous visit he stated that he has had to stop going to cardiac rehab secondary to dizziness and fear of falling while doing the exercises, though he states that he thinks his heart is "doing well."  He also states that he has mild shortness of breath on exertion, a sensation of "pills getting stuck" when he swallows, early satiety, continued fatigue, lack of appetite, anxiety, unintentional weight loss, and memory loss.  He denies lower extremity edema, chest tightness/pain, abdominal pain, nausea, vomiting, or sweating.  He has seen GI for wt loss and incomplete evacuation, but did not appear to mention dysphagia.  No procedures rec @ this time 2/2 ongoing brilinta therapy.  Overall, symptoms have been present since at least April of this year, though they have worsened since July.  Home Medications    Prior to Admission medications   Medication Sig Start Date End Date Taking? Authorizing  Provider  aspirin 81 MG chewable tablet Chew 1 tablet (81 mg total) by mouth daily. 12/24/17   Alford Highland, MD  atorvastatin (LIPITOR) 80 MG tablet Take 1 tablet (80 mg total) by mouth daily at 6 PM. 03/01/18   Creig Hines, NP  busPIRone (BUSPAR) 10 MG tablet Take 10 mg by mouth 2 (two) times daily.  01/26/18   [provider]  busPIRone (BUSPAR) 7.5 MG tablet Take 7.5 mg by mouth daily.  12/29/17   [provider]    calcitRIOL (ROCALTROL) 0.25 MCG capsule Take 0.25 mcg by mouth daily. 12/09/17   [provider]  cyanocobalamin 1000 MCG tablet Take 1,000 mcg by mouth daily.    [provider]  losartan (COZAAR) 25 MG tablet Take 1 tablet (25 mg total) by mouth daily. 03/01/18 05/30/18  Creig Hines, NP  metoprolol succinate (TOPROL-XL) 25 MG 24 hr tablet Take 1 tablet (25 mg total) by mouth daily. Take with or immediately following a meal. 03/01/18   Creig Hines, NP  Multiple Vitamins-Minerals (CENTRUM SILVER 50+MEN) TABS Take 1 tablet by mouth daily.    [provider]  nitroGLYCERIN (NITROSTAT) 0.4 MG SL tablet Place 1 tablet (0.4 mg total) under the tongue every 5 (five) minutes as needed for chest pain. 12/24/17   Alford Highland, MD  ticagrelor (BRILINTA) 90 MG TABS tablet Take 1 tablet (90 mg total) by mouth 2 (two) times daily. 03/01/18   Creig Hines, NP    Review of Systems   Fatigue, weight loss, orthostatic lightheadedness, SOB, early satiety, weakness, depression and anxiety. He denies fever, diaphoresis, chest pain, orthopnea, leg swelling, PND, abdominal pain, nausea, vomiting, rash or loss of consciousness.  All other systems reviewed and are otherwise negative except as noted above.  Physical Exam    VS:  BP 134/84 (BP Location: Right Arm, Patient Position: Sitting, Cuff Size: Normal)   Pulse 61   Ht 6' (1.829 m)   Wt 157 lb 8 oz (71.4 kg)   BMI 21.36 kg/m  , BMI Body mass index is 21.36 kg/m.   Orthostatic VS for the past 24 hrs:  BP- Lying Pulse- Lying BP- Sitting Pulse- Sitting BP- Standing at 0 minutes Pulse- Standing at 0 minutes  04/08/18 1350 134/79 61 129/77 64 101/67 75     GEN: Pale, thin, in no acute distress. HEENT: normal. Neck: Supple, no JVD, carotid bruits, or masses. Cardiac: RRR, no murmurs, rubs, or gallops. No clubbing, cyanosis, edema.  PT 2+ and equal bilaterally.  Respiratory:  Respirations  regular and unlabored, clear to auscultation bilaterally. GI: Soft, nondistended. MS: no deformity or atrophy. Multiple, large varicose veins to the lower extremities. Skin: warm and dry with flaking skin, no rash. Neuro:  Strength and sensation are intact. Psych: Normal affect.  Accessory Clinical Findings    ECG personally reviewed by me today - normal sinus rhythm, 61 BPM, LAD, no acute ST or T wave changes - no acute changes.  Lab Results  Component Value Date   CREATININE 1.72 (H) 03/01/2018   BUN 19 03/01/2018   NA 141 03/01/2018   K 4.6 03/01/2018   CL 104 03/01/2018   CO2 21 03/01/2018    Lab Results  Component Value Date   WBC 10.1 03/01/2018   HGB 13.4 03/01/2018   HCT 38.5 03/01/2018   MCV 87 03/01/2018   PLT 360 03/01/2018    Lab Results  Component Value Date   TSH 2.920 03/01/2018  Assessment & Plan    1.  Orthostatic Hypotension: The patient continues to have persistent orthostatic lightheadedness and fatigue. Blood pressure today dropped from 134 to 101 with a rise in heart rate from 61 to 75. With worsening fatigue and depression since initiation of  blocker, I will wean him off of this over the next few days.  We again discussed the importance of adequate hydration and and nutrition.   2. Fatigue/Unintentional Weight loss: Etiology unclear at this point.  He's down six more lbs since his last visit and he notes early satiety and poor appetite as the main culprit for this.  Labs in Sept showed stable if not improved renal fxn, nl CBC, and nl TSH.  Though symptoms started prior to his MI, they have worsened since then.  As above, in the setting of orthostasis with fatigue and depression, I am going to wean off his  blocker over the next three days.  I am also going to f/u an echo as though his volume appears stable on exam, low output heart failure can also present this way.  Recommended to continue pushing oral fluids as well as encouraging protein drinks.  Patient will be following up with gastroenterology to assess his swallowing complaints.   3. Coronary Artery Disease: Status post non-STEMI in July with PCI and drug-eluting stent placement to the RCA. He has not been having chest pain or tightness, though he does have dyspnea on exertion, which isn't significantly changed.  As above, euvolemic on exam.  In the setting ICM and given it's been three months since event  f/u echo. Recommend cessation of  blocker as above. Continue statin, Brilinta, and ARB.   4. ICM/HFrEF: Euvolemic on exam. Schedule outpatient echocardiogram to evaluate heart motion and ejection fraction as above. Continue ARB. Discontinue  blocker as above.  5. Hyperlipidemia: Most recent lipid panel in July showing LDL of 95 with normal LFTs in September. He will need f/u lipids and lft's.  We can arrange for this at his next visit.  6.  CKD III:  Creat recently stable - 9/16. Cont ARB.  7.   BPH:  He notes nocturia and urinary hesitancy, which has gotten worse over the past year.  He has seen urology before and I recommended early f/u to address further.  8.  Depression: Seen by neuro recently and depression/anxiety meds changed up.  No significant effect yet.   blocker may be contributing  stopping as above.  9.  Disposition: F/u echocardiogram. F/u in clinic in 6 weeks or sooner if necessary.  Will need to arrange for f/u lipids/lft's @ that time.  Nicolasa Ducking, NP 04/08/2018, 4:12 PM

## 2018-04-08 NOTE — Patient Instructions (Signed)
Medication Instructions:  Your physician has recommended you make the following change in your medication:  1- Wean off Metoprolol. Take 1/2 tablet by mouth once a day for 2 days, THEN STOP.  If you need a refill on your cardiac medications before your next appointment, please call your pharmacy.   Lab work: none If you have labs (blood work) drawn today and your tests are completely normal, you will receive your results only by: Marland Kitchen MyChart Message (if you have MyChart) OR . A paper copy in the mail If you have any lab test that is abnormal or we need to change your treatment, we will call you to review the results.  Testing/Procedures: Your physician has requested that you have an echocardiogram. Echocardiography is a painless test that uses sound waves to create images of your heart. It provides your doctor with information about the size and shape of your heart and how well your heart's chambers and valves are working. This procedure takes approximately one hour. There are no restrictions for this procedure. You may get an IV, if needed, to receive an ultrasound enhancing agent through to better visualize your heart.     Follow-Up: At [facility], you and your health needs are our priority.  As part of our continuing mission to provide you with exceptional heart care, we have created designated Provider Care Teams.  These Care Teams include your primary Cardiologist (physician) and Advanced Practice Providers (APPs -  Physician Assistants and Nurse Practitioners) who all work together to provide you with the care you need, when you need it. You will need a follow up appointment in 6-8 weeks.   You may see Yvonne Kendall, MD or one of the following Advanced Practice Providers on your designated Care Team:   Nicolasa Ducking, NP Eula Listen, PA-C . Marisue Ivan, PA-C

## 2018-04-21 ENCOUNTER — Ambulatory Visit (INDEPENDENT_AMBULATORY_CARE_PROVIDER_SITE_OTHER): Payer: Medicare Other

## 2018-04-21 ENCOUNTER — Other Ambulatory Visit: Payer: Self-pay

## 2018-04-21 DIAGNOSIS — I255 Ischemic cardiomyopathy: Secondary | ICD-10-CM

## 2018-04-23 ENCOUNTER — Telehealth: Payer: Self-pay | Admitting: Nurse Practitioner

## 2018-04-23 NOTE — Telephone Encounter (Signed)
Notes recorded by Creig Hines, NP on 04/23/2018 at 7:59 AM EST Heart squeezing fxn down slightly from prior recording (was 40-45%, now read as 35-40%). Heart also mildly stiff. Stable, mild valve disease. Cont current meds. Any improvement in orthostasis/fatigue off of BB?

## 2018-04-23 NOTE — Telephone Encounter (Signed)
I spoke with  Mrs. Ivens regarding the patient's echo results (ok per DPR).  She verbalizes understanding of results.  I did inquire how the patient is currently doing off of her beta blocker.  Per Mrs. Graddick, the patient seems to be getting worse, although not cardiac related. He is continuing to lose weight.  His balance is not good/ unsteady on his feet and BP will drop at times. They are starting to follow up with [facility] for further work up.  She states the patient needs an MRI with contrast, but this is contraindicated due to his renal function.  She was appreciative for the call back regarding the patient's echo.  Will forward to Ward Givens, NP as an Lorain Childes.

## 2018-05-12 NOTE — Telephone Encounter (Signed)
Patient has appointment with Brion Aliment on 05/26/18. Routing to him.

## 2018-05-12 NOTE — Telephone Encounter (Signed)
Not ideal to stop DAPT at this point, but he is about 4 months out from his MI.  If LP must be done, I think it is reasonable to stop ticagrelor 5 days before LP and restart it as soon as it is felt safe to do so by the provider performing the LP.  ASA 81 mg daily must be continued unless there is high risk for severe bleeding leading to neurologic compromise and/or death.  Yvonne Kendall, MD [facility] Pager: [phone]

## 2018-05-12 NOTE — Telephone Encounter (Signed)
° °  [facility] Pre-operative Risk Assessment    Request for surgical clearance:  1. What type of surgery is being performed?  CSF/ LP s/p mri   2. When is this surgery scheduled? Not noted   3. What type of clearance is required (medical clearance vs. Pharmacy clearance to hold med vs. Both)? Pharmacy   4. Are there any medications that need to be held prior to surgery and how long? DAPT-7 days prior to LP 5. Practice name and name of physician performing surgery? Dr Christ Kick [facility]  6. What is your office phone number MD CELL [phone]   7.   What is your office fax number not noted   8.   Anesthesia type (None, local, MAC, general) ? Not noted    Clarisse Gouge 05/12/2018, 2:27 PM  _________________________________________________________________   (provider comments below)

## 2018-05-17 NOTE — Telephone Encounter (Signed)
Clearance routed to [phone] via fax from Epic.

## 2018-05-18 ENCOUNTER — Encounter: Payer: Self-pay | Admitting: Nurse Practitioner

## 2018-05-20 ENCOUNTER — Telehealth: Payer: Self-pay | Admitting: Internal Medicine

## 2018-05-20 NOTE — Telephone Encounter (Signed)
I was contacted by Dr. Verdie Mosher regarding management of antiplatelet therapy in anticipation of lumbar puncture for evaluation of progressive neurologic decline.  Dr. Verdie Mosher now plans to proceed with LP in January, at which point Mr. Hoerl will have completed 6 months of DAPT following NSTEMI and PCI to the mid RCA.  I think it is reasonable to proceed with LP as planned.  Ticagrelor should be held 5 days before the procedure and restarted when felt safe by Dr. Verdie Mosher.  Aspirin 81 mg daily should be continued uninterrupted.  Yvonne Kendall, MD [facility] Pager: [phone]

## 2018-05-26 ENCOUNTER — Encounter: Payer: Self-pay | Admitting: Nurse Practitioner

## 2018-05-26 ENCOUNTER — Ambulatory Visit (INDEPENDENT_AMBULATORY_CARE_PROVIDER_SITE_OTHER): Payer: Medicare Other | Admitting: Nurse Practitioner

## 2018-05-26 VITALS — BP 127/71 | HR 93 | Ht 72.0 in | Wt 149.0 lb

## 2018-05-26 DIAGNOSIS — I2510 Atherosclerotic heart disease of native coronary artery without angina pectoris: Secondary | ICD-10-CM

## 2018-05-26 DIAGNOSIS — I951 Orthostatic hypotension: Secondary | ICD-10-CM

## 2018-05-26 DIAGNOSIS — I5022 Chronic systolic (congestive) heart failure: Secondary | ICD-10-CM

## 2018-05-26 DIAGNOSIS — I255 Ischemic cardiomyopathy: Secondary | ICD-10-CM

## 2018-05-26 NOTE — Patient Instructions (Signed)
Medication Instructions:  No changes  If you need a refill on your cardiac medications before your next appointment, please call your pharmacy.   Lab work: None ordered  Testing/Procedures: None ordered  Follow-Up: At [facility], you and your health needs are our priority.  As part of our continuing mission to provide you with exceptional heart care, we have created designated Provider Care Teams.  These Care Teams include your primary Cardiologist (physician) and Advanced Practice Providers (APPs -  Physician Assistants and Nurse Practitioners) who all work together to provide you with the care you need, when you need it. You will need a follow up appointment in 3 months.  Please call our office 2 months in advance to schedule this appointment.  You may see Yvonne Kendall, MD or one of the following Advanced Practice Providers on your designated Care Team:   Nicolasa Ducking, NP Eula Listen, PA-C . Marisue Ivan, PA-C

## 2018-05-26 NOTE — Progress Notes (Signed)
Office Visit    Patient Name: Jesus Bolton Date of Encounter: 05/26/2018  Primary Care Provider:  Dione Housekeeper, MD Primary Cardiologist:  Yvonne Kendall, MD  Chief Complaint    72 y/o ? with a history of CAD status post non-STEMI (July 2019), ischemic cardiomyopathy, hypertension, hyperlipidemia, stage III chronic kidney disease, orthostatic hypotension, memory loss, and BPH, who presents for follow-up related to CAD and orthostasis.  Past Medical History    Past Medical History:  Diagnosis Date  . BPH (benign prostatic hyperplasia)   . CAD (coronary artery disease)    a. 12/2017 NSTEMI/PCI: LM nl, LAD 40-50p, 20-61m, LCX nl, RCA 20-30ost/p, 40m (3.25x12 Xience Sierra DES - post-dil to 3.55mm), 20d.  . CKD (chronic kidney disease), stage III (HCC)   . Elevated PSA   . Essential hypertension   . Indigestion   . Ischemic cardiomyopathy    a. 12/2017 Echo: EF 40-45%, diff HK, Gr1 DD, mild LVH. Mild AI. Mildly dil LA. Nl RV size/fxn. PASP 40-22mmHg; b. 04/2018 Echo: EF 35-40%, diff HK, Gr1 DD, mild AI, mild MR, mildly dil LA, Nl RV fxn.  . Thyroid disease    Past Surgical History:  Procedure Laterality Date  . CARDIAC CATHETERIZATION    . GREEN LIGHT LASER TURP (TRANSURETHRAL RESECTION OF PROSTATE N/A 01/02/2015   Procedure: GREEN LIGHT LASER TURP (TRANSURETHRAL RESECTION OF PROSTATE;  Surgeon: Orson Ape, MD;  Location: [facility] ORS;  Service: Urology;  Laterality: N/A;  . LEFT HEART CATH AND CORONARY ANGIOGRAPHY N/A 12/23/2017   Procedure: LEFT HEART CATH AND CORONARY ANGIOGRAPHY;  Surgeon: Yvonne Kendall, MD;  Location: [facility] INVASIVE CV LAB;  Service: Cardiovascular;  Laterality: N/A;    Allergies  Allergies  Allergen Reactions  . Sulfa Antibiotics Rash    History of Present Illness    72 year old male with above complex past medical history including CAD status post non-STEMI in July 2019 with drug-eluting stent placement to the mid RCA.  EF was noted to be  40 to 45% at that time consistent with ischemic cardiomyopathy.  He also has a history of hypertension, hyperlipidemia, stage III chronic kidney disease, BPH, and orthostatic hypotension.  We had previously reduced his losartan to 25 mg daily and in the setting of fatigue and depression, I weaned him off of his beta-blocker following his April 08, 2018 visit.  Since then, he has not noticed a significant difference in his degree of fatigue or orthostatic lightheadedness.  He notes that if he gets up more slowly, he is able to prevent orthostatic lightheadedness but his wife notes that he is very excitable and will sometimes jump up without thinking about it.  Patient is also noticed occasional episodes of feeling "wobbly" if he is standing for prolonged periods of time.  This is not necessarily related to lightheadedness but is more often associated with his legs feeling weak, causing him to sit down.  It might only take 3 to 5 minutes of standing, such as when getting dressed, or in the shower, prior to feeling "wobbly."  He has neither fallen nor lost consciousness.  He does have TED stockings at home however he does not like wearing them.  His wife says they have even purchased a device to help him get his stockings on but he just prefers not to wear them.  He has been evaluated by neurology at [facility] in the setting of progressive memory loss, unintentional weight loss, weakness, and orthostasis.  He was recently placed  on Aricept and also advised to use his CPAP, given prior sleep apnea diagnosis.  He is pending lumbar puncture, which is now tentatively scheduled for January.  This is previously been discussed with Dr. Okey Dupre with the plan for patient to come off of Brilinta 5 days prior to the procedure and then resume post procedure.  Mr. Pietrzyk wife reports that she has continued to note a decline in his cognitive function.  He often forgets things.  From a cardiac standpoint however, he has been doing well.   He does not experience chest pain, palpitations, PND, orthopnea, edema, or early satiety.  He does have chronic, stable dyspnea on exertion.  His weight is down 8 additional pounds since his last visit despite some, though minimal improvement in his appetite.  Home Medications    Prior to Admission medications   Medication Sig Start Date End Date Taking? Authorizing Provider  aspirin 81 MG chewable tablet Chew 1 tablet (81 mg total) by mouth daily. 12/24/17   Alford Highland, MD  atorvastatin (LIPITOR) 80 MG tablet Take 1 tablet (80 mg total) by mouth daily at 6 PM. 03/01/18   Creig Hines, NP  calcitRIOL (ROCALTROL) 0.25 MCG capsule Take 0.25 mcg by mouth daily. 12/09/17   [provider]  cyanocobalamin 1000 MCG tablet Take 1,000 mcg by mouth daily.    [provider]  FLUoxetine HCl (PROZAC PO) Take by mouth 2 (two) times daily.    [provider]  losartan (COZAAR) 25 MG tablet Take 1 tablet (25 mg total) by mouth daily. 03/01/18 05/30/18  Creig Hines, NP  Multiple Vitamins-Minerals (CENTRUM SILVER 50+MEN) TABS Take 1 tablet by mouth daily.    [provider]  nitroGLYCERIN (NITROSTAT) 0.4 MG SL tablet Place 1 tablet (0.4 mg total) under the tongue every 5 (five) minutes as needed for chest pain. 12/24/17   Alford Highland, MD  ticagrelor (BRILINTA) 90 MG TABS tablet Take 1 tablet (90 mg total) by mouth 2 (two) times daily. 03/01/18   Creig Hines, NP    Review of Systems    Ongoing weakness, fatigue, forgetfulness, orthostasis, intermittent lightheadedness, chronic dyspnea on exertion (stable).  He denies chest pain, palpitations, PND, orthopnea, syncope, edema, or early satiety.  All other systems reviewed and are otherwise negative except as noted above.  Physical Exam    VS:  BP 127/71 (BP Location: Left Arm, Patient Position: Sitting, Cuff Size: Normal)   Pulse 93   Ht 6' (1.829 m)   Wt 149 lb (67.6 kg)   BMI  20.21 kg/m  , BMI Body mass index is 20.21 kg/m.  Orthostatic VS for the past 24 hrs:  BP- Lying Pulse- Lying BP- Sitting Pulse- Sitting BP- Standing at 0 minutes Pulse- Standing at 0 minutes  05/26/18 1359 128/75 79 104/62 83 (!) 89/60 98     GEN: Frail, in no acute distress. HEENT: normal. Neck: Supple, no JVD, carotid bruits, or masses. Cardiac: RRR, no murmurs, rubs, or gallops. No clubbing, cyanosis, edema.  Radials/PT 1+ and equal bilaterally.  Respiratory:  Respirations regular and unlabored, clear to auscultation bilaterally. GI: Soft, nontender, nondistended, BS + x 4. MS: no deformity or atrophy. Skin: warm and dry, no rash. Neuro:  Strength and sensation are intact. Psych: Normal affect.  Accessory Clinical Findings    ECG personally reviewed by me today -copy of EKG from [facility] dated May 18, 2018: Regular sinus rhythm, 81, left axis deviation- no acute changes.  Assessment &  Plan    1.  Orthostatic hypotension: This is been present at least since his MI, if not before.  We have previously discontinued beta-blocker therapy and reduced losartan.  He remains orthostatic today though was not particularly symptomatic.  He does occasionally note orthostatic lightheadedness at home and has been more intentional below about changing positions.  We did discuss possibly discontinuing his losartan however he and his wife prefer to remain on it given his history of chronic kidney disease.  In that setting, we are looking towards nonpharmacologic measures, such as a greater commitment to wearing support hose, and potentially some liberalization of salt in his diet with the understanding that he has an ischemic cardiomyopathy and will need close monitoring of weight and volume status.  2.  Fatigue/unintentional weight loss: Weight is down 8 pounds since his last visit.  He feels his appetite has improved slightly.  With this and some progression of dementia, he is being worked up by [facility]  neurology with plan for lumbar puncture in January.  3.  Coronary artery disease: Status post non-STEMI in July 2019 with PCI and drug-eluting stent placement to the RCA.  He has not been having any chest pain or tightness.  He has chronic, stable dyspnea on exertion.  He remains on aspirin, statin, and Brilinta therapy.  As previously noted, patient is planned for lumbar puncture in January.  As that will be greater than 6 months following PCI, he will be able to hold Brilinta for 5 days and then resume post procedure.  He will remain on uninterrupted aspirin throughout the periprocedural period.  4.  Ischemic cardiomyopathy/HFrEF: EF 35 to 40% by most recent echo.  Euvolemic on exam.  He is on low-dose ARB and beta-blocker was previously discontinued in the setting of fatigue and orthostasis.  No MRSA in the setting of orthostasis and stage III chronic kidney disease.  We did discuss discontinuation of ARB, however at this time, patient prefers to remain on this.  5.  Stage III chronic kidney disease: Creatinine stable at 1.5 in November.  He is followed closely by nephrology.  He is on low-dose losartan as above.  6.  Obstructive sleep apnea: Previously diagnosed however, patient has not been using CPAP.  This does result in daytime somnolence and naps.  He and his wife are leaving the office today and going to a medical supply store to obtain a device for CPAP.  7.  Disposition: Patient will follow-up in 3 months or sooner if necessary.  Nicolasa Ducking, NP 05/26/2018, 2:40 PM

## 2018-08-30 ENCOUNTER — Telehealth: Payer: Self-pay | Admitting: Internal Medicine

## 2018-08-30 NOTE — Telephone Encounter (Signed)
Called patient's wife, ok per DPR. She verbalized understanding of Dr Serita Kyle recommendations and was agreeable to r/s appointment. This was done. She was very appreciative and felt much relief with this plan.

## 2018-08-30 NOTE — Telephone Encounter (Signed)
Pt wife is returning your call.  

## 2018-08-30 NOTE — Telephone Encounter (Signed)
Called and spoke with patient's wife, ok per DPR. Lumbar puncture in January showed frontal lobe dementia r/o Alzheimer's and parkinson's disease.  Today while in the shower had area at base of right buttock that is hard, movable and quarter-sized. Not tender, red or swollen. She denies it looks like a pimple.  She is afraid it is a blood clot. Advised her to call patient's PCP regarding this.   She continued to tell me that patient has in her words "gone down hill" over the past week.  He is having trouble swallowing his pills and does not want to eat.  Says that his food gets stuck and he cannot swallow.  Wife is care-giving and is concerned about him Patient has been seeing neurologist at [facility].  I advised her to call there today to update them on patient's status with swallowing that has changed and seek advice. Patient has appointment at [facility] on Thursday.  Patient has appointment with [place] on Wednesday and is not sure patient should come or not. She is concerned about bringing patient out in and getting infected with Covid-19. Advised her that I do not have anything information about appointments being cancelled at this time.   Again, advised wife to call neurology to give them an update on patient.  She is concerned and trying out which appointment may be more important for patient if she had to go to just one appointment as she does not feel patient is strong enough to travel all over and is afraid of the virus. Routing to Dr End for review.

## 2018-08-30 NOTE — Telephone Encounter (Signed)
Pt wife called, states when pt was showering this morning, he noticed a big lump on his right leg toward his hip. States it is not swollen, no redness, not tender to the touch. Pt wife is concerned this is may be a blood clot. Please call to discuss.

## 2018-08-30 NOTE — Telephone Encounter (Signed)
LMOM

## 2018-08-30 NOTE — Telephone Encounter (Signed)
Based on concerns described, as long as Mr. Patterson is not having active cardiac symptoms, I think it would be most appropriate for him to follow-up with his neurologist and reschedule his appointment with [place] for a future date.  Yvonne Kendall, MD [facility] Pager: [phone]

## 2018-09-01 ENCOUNTER — Ambulatory Visit: Payer: Medicare Other | Admitting: Internal Medicine

## 2018-09-20 ENCOUNTER — Emergency Department
Admission: EM | Admit: 2018-09-20 | Discharge: 2018-09-20 | Disposition: A | Discharge status: Home / Self Care | Payer: Medicare Other | Attending: Emergency Medicine | Admitting: Emergency Medicine

## 2018-09-20 ENCOUNTER — Emergency Department: Payer: Medicare Other

## 2018-09-20 ENCOUNTER — Other Ambulatory Visit: Payer: Self-pay

## 2018-09-20 ENCOUNTER — Encounter: Payer: Self-pay | Admitting: Emergency Medicine

## 2018-09-20 DIAGNOSIS — I252 Old myocardial infarction: Secondary | ICD-10-CM | POA: Diagnosis not present

## 2018-09-20 DIAGNOSIS — R1310 Dysphagia, unspecified: Secondary | ICD-10-CM | POA: Insufficient documentation

## 2018-09-20 DIAGNOSIS — I130 Hypertensive heart and chronic kidney disease with heart failure and stage 1 through stage 4 chronic kidney disease, or unspecified chronic kidney disease: Secondary | ICD-10-CM | POA: Diagnosis not present

## 2018-09-20 DIAGNOSIS — Z7982 Long term (current) use of aspirin: Secondary | ICD-10-CM | POA: Insufficient documentation

## 2018-09-20 DIAGNOSIS — N183 Chronic kidney disease, stage 3 (moderate): Secondary | ICD-10-CM | POA: Diagnosis not present

## 2018-09-20 DIAGNOSIS — I2510 Atherosclerotic heart disease of native coronary artery without angina pectoris: Secondary | ICD-10-CM | POA: Diagnosis not present

## 2018-09-20 DIAGNOSIS — Z79899 Other long term (current) drug therapy: Secondary | ICD-10-CM | POA: Insufficient documentation

## 2018-09-20 DIAGNOSIS — R070 Pain in throat: Secondary | ICD-10-CM | POA: Diagnosis present

## 2018-09-20 DIAGNOSIS — K5900 Constipation, unspecified: Secondary | ICD-10-CM | POA: Insufficient documentation

## 2018-09-20 DIAGNOSIS — I5022 Chronic systolic (congestive) heart failure: Secondary | ICD-10-CM | POA: Diagnosis not present

## 2018-09-20 DIAGNOSIS — F0390 Unspecified dementia without behavioral disturbance: Secondary | ICD-10-CM | POA: Insufficient documentation

## 2018-09-20 HISTORY — DX: Unspecified dementia without behavioral disturbance: F03.90

## 2018-09-20 LAB — COMPREHENSIVE METABOLIC PANEL
ALT: 14 U/L (ref 0–44)
AST: 21 U/L (ref 15–41)
Albumin: 4.4 g/dL (ref 3.5–5.0)
Alkaline Phosphatase: 144 U/L — ABNORMAL HIGH (ref 38–126)
Anion gap: 15 (ref 5–15)
BUN: 39 mg/dL — ABNORMAL HIGH (ref 8–23)
CO2: 21 mmol/L — ABNORMAL LOW (ref 22–32)
Calcium: 10.1 mg/dL (ref 8.9–10.3)
Chloride: 105 mmol/L (ref 98–111)
Creatinine, Ser: 1.65 mg/dL — ABNORMAL HIGH (ref 0.61–1.24)
GFR calc Af Amer: 47 mL/min — ABNORMAL LOW (ref >60)
GFR calc non Af Amer: 41 mL/min — ABNORMAL LOW (ref >60)
Glucose, Bld: 88 mg/dL (ref 70–99)
Potassium: 4.3 mmol/L (ref 3.5–5.1)
Sodium: 141 mmol/L (ref 135–145)
Total Bilirubin: 1.3 mg/dL — ABNORMAL HIGH (ref 0.3–1.2)
Total Protein: 7.9 g/dL (ref 6.5–8.1)

## 2018-09-20 LAB — CBC
HCT: 46.6 % (ref 39.0–52.0)
Hemoglobin: 15.1 g/dL (ref 13.0–17.0)
MCH: 30 pg (ref 26.0–34.0)
MCHC: 32.4 g/dL (ref 30.0–36.0)
MCV: 92.5 fL (ref 80.0–100.0)
Platelets: 318 10*3/uL (ref 150–400)
RBC: 5.04 MIL/uL (ref 4.22–5.81)
RDW: 13.2 % (ref 11.5–15.5)
WBC: 7.7 10*3/uL (ref 4.0–10.5)
nRBC: 0 % (ref 0.0–0.2)

## 2018-09-20 LAB — LIPASE, BLOOD: Lipase: 38 U/L (ref 11–51)

## 2018-09-20 MED ORDER — SODIUM CHLORIDE 0.9 % IV SOLN
1000.0000 mL | Freq: Once | INTRAVENOUS | Status: AC
  Administered 2018-09-20: 1000 mL via INTRAVENOUS

## 2018-09-20 MED ORDER — ONDANSETRON 4 MG PO TBDP: 4.0000 mg | ORAL_TABLET | Freq: Three times a day (TID) | ORAL | 0 refills | Status: DC | PRN

## 2018-09-20 MED ORDER — SODIUM CHLORIDE 0.9% FLUSH
3.0000 mL | Freq: Once | INTRAVENOUS | Status: AC
  Administered 2018-09-20: 13:00:00 3 mL via INTRAVENOUS

## 2018-09-20 NOTE — ED Notes (Signed)
[phone] donna.

## 2018-09-20 NOTE — Discharge Instructions (Signed)
Jesus Bolton lab work was reassuring today. He received IV fluids. His abdomen xray did not show any constipation. We discussed with Gastroenterology the need for outpatient evaluation and likely barium swallow testing. He was able to drink water without difficulty in the ED. We have prescribed a nausea medication to see if that helps

## 2018-09-20 NOTE — ED Triage Notes (Signed)
patietn had some constipation last week. Took meds and has had bowel movemtns.  But wife is concerned that he is not eating and that he is having difficulty swollowing.  Losing weight--had been seeking medical care for same, but all appointments were canceled due to pandemic.  He does have demintia--frontal lobe.Fredna Dow says he has stage 3 kidney failure.

## 2018-09-20 NOTE — ED Provider Notes (Signed)
[facility] Emergency Department Provider Note   ____________________________________________    I have reviewed the triage vital signs and the nursing notes.   HISTORY  Chief Complaint Difficulty swallowing, constipation    HPI Jesus Bolton is a 73 y.o. male with a history of frontotemporal dementia, chronic kidney disease who presents with decreased p.o. intake due to difficulty swallowing as well as a sensation of constipation.  Patient reports he has not had a bowel movement in over a week.  He reports since 3 days ago he has not been able to tolerate liquids or solids p.o.  He reports if he drinks water it only "goes down a little bit and then comes right back up ".  He denies throat pain or swelling  Past Medical History:  Diagnosis Date  . BPH (benign prostatic hyperplasia)   . CAD (coronary artery disease)    a. 12/2017 NSTEMI/PCI: LM nl, LAD 40-50p, 20-38m, LCX nl, RCA 20-30ost/p, 44m (3.25x12 Xience Sierra DES - post-dil to 3.51mm), 20d.  . CKD (chronic kidney disease), stage III (HCC)   . Dementia (HCC)   . Elevated PSA   . Essential hypertension   . Indigestion   . Ischemic cardiomyopathy    a. 12/2017 Echo: EF 40-45%, diff HK, Gr1 DD, mild LVH. Mild AI. Mildly dil LA. Nl RV size/fxn. PASP 40-79mmHg; b. 04/2018 Echo: EF 35-40%, diff HK, Gr1 DD, mild AI, mild MR, mildly dil LA, Nl RV fxn.  . Thyroid disease     Patient Active Problem List   Diagnosis Date Noted  . Chronic systolic heart failure (HCC) 01/13/2018  . Ischemic cardiomyopathy 01/13/2018  . Hyperlipidemia LDL goal <70 01/13/2018  . Anxiety 01/13/2018  . NSTEMI (non-ST elevated myocardial infarction) (HCC) 12/22/2017  . Elevated parathyroid hormone 11/05/2017  . Weight loss, unintentional 11/05/2017  . Loss of memory 11/02/2017  . Vitamin B12 deficiency 11/02/2017  . Chronic kidney disease, stage III (moderate) (HCC) 05/22/2016  . Ds DNA antibody positive 05/22/2016  .  Elevated PSA 08/26/2012  . Seborrhea 08/26/2012  . Essential (primary) hypertension 08/23/2012    Past Surgical History:  Procedure Laterality Date  . CARDIAC CATHETERIZATION    . GREEN LIGHT LASER TURP (TRANSURETHRAL RESECTION OF PROSTATE N/A 01/02/2015   Procedure: GREEN LIGHT LASER TURP (TRANSURETHRAL RESECTION OF PROSTATE;  Surgeon: Orson Ape, MD;  Location: [facility] ORS;  Service: Urology;  Laterality: N/A;  . LEFT HEART CATH AND CORONARY ANGIOGRAPHY N/A 12/23/2017   Procedure: LEFT HEART CATH AND CORONARY ANGIOGRAPHY;  Surgeon: Yvonne Kendall, MD;  Location: [facility] INVASIVE CV LAB;  Service: Cardiovascular;  Laterality: N/A;    Prior to Admission medications   Medication Sig Start Date End Date Taking? Authorizing Provider  ALPRAZolam Prudy Feeler) 0.25 MG tablet Take 0.25 mg by mouth at bedtime as needed for anxiety.    [provider]  aspirin 81 MG chewable tablet Chew 1 tablet (81 mg total) by mouth daily. 12/24/17   Alford Highland, MD  atorvastatin (LIPITOR) 80 MG tablet Take 1 tablet (80 mg total) by mouth daily at 6 PM. 03/01/18   Creig Hines, NP  calcitRIOL (ROCALTROL) 0.25 MCG capsule Take 0.25 mcg by mouth daily. 12/09/17   [provider]  citalopram (CELEXA) 10 MG tablet Take by mouth. 04/20/18 04/20/19  [provider]  cyanocobalamin 1000 MCG tablet Take 1,000 mcg by mouth daily.    [provider]  docusate sodium (COLACE) 100 MG capsule Take by mouth.  03/08/18   [provider]  donepezil (ARICEPT) 5 MG tablet 2.5mg  qam for 1 month. Increase by 2.5mg  every month until 10mg  is reach if there are no adverse effects. 05/18/18   [provider]  losartan (COZAAR) 25 MG tablet Take 1 tablet (25 mg total) by mouth daily. 03/01/18 05/30/18  Creig Hines, NP  Multiple Vitamins-Minerals (CENTRUM SILVER 50+MEN) TABS Take 1 tablet by mouth daily.    [provider]  nitroGLYCERIN (NITROSTAT) 0.4 MG SL  tablet Place 1 tablet (0.4 mg total) under the tongue every 5 (five) minutes as needed for chest pain. 12/24/17   Alford Highland, MD  ondansetron (ZOFRAN ODT) 4 MG disintegrating tablet Take 1 tablet (4 mg total) by mouth every 8 (eight) hours as needed. 09/20/18   Jene Every, MD  ticagrelor (BRILINTA) 90 MG TABS tablet Take 1 tablet (90 mg total) by mouth 2 (two) times daily. 03/01/18   Creig Hines, NP     Allergies Sulfa antibiotics  Family History  Problem Relation Age of Onset  . Cancer Mother        a. cardiac tumor    Social History Social History   Tobacco Use  . Smoking status: Never Smoker  . Smokeless tobacco: Never Used  Substance Use Topics  . Alcohol use: No  . Drug use: No    Review of Systems  Constitutional: No fever/chills Eyes: No visual changes.  ENT: As above Cardiovascular: Denies chest pain. Respiratory: Denies shortness of breath. Gastrointestinal: As above, no abdominal pain, constipation Genitourinary: Negative for dysuria. Musculoskeletal: Negative for back pain. Skin: Negative for rash. Neurological: Negative for headaches no neuro deficits   ____________________________________________   PHYSICAL EXAM:  VITAL SIGNS: ED Triage Vitals  Enc Vitals Group     BP 09/20/18 1141 121/81     Pulse Rate 09/20/18 1141 98     Resp 09/20/18 1141 16     Temp 09/20/18 1141 97.9 F (36.6 C)     Temp Source 09/20/18 1141 Oral     SpO2 09/20/18 1141 100 %     Weight 09/20/18 1141 59 kg (130 lb)     Height 09/20/18 1141 1.829 m (6')     Head Circumference --      Peak Flow --      Pain Score 09/20/18 1146 8     Pain Loc --      Pain Edu? --      Excl. in GC? --     Constitutional: Alert and oriented. No acute distress.  Eyes: Conjunctivae are normal.   Nose: No congestion/rhinnorhea. Mouth/Throat: Mucous membranes are moist.  Pharynx normal Neck:  Painless ROM Cardiovascular: Normal rate, regular rhythm. Grossly normal  heart sounds.  Good peripheral circulation. Respiratory: Normal respiratory effort.  No retractions.  Gastrointestinal: Soft and nontender. No distention.    Musculoskeletal:   Warm and well perfused Neurologic:  Normal speech and language. No gross focal neurologic deficits are appreciated.  Cranial nerves II through XII are normal Skin:  Skin is warm, dry and intact. No rash noted. Psychiatric: Mood and affect are normal. Speech and behavior are normal.  ____________________________________________   LABS (all labs ordered are listed, but only abnormal results are displayed)  Labs Reviewed  COMPREHENSIVE METABOLIC PANEL - Abnormal; Notable for the following components:      Result Value   CO2 21 (*)    BUN 39 (*)    Creatinine, Ser 1.65 (*)    Alkaline  Phosphatase 144 (*)    Total Bilirubin 1.3 (*)    GFR calc non Af Amer 41 (*)    GFR calc Af Amer 47 (*)    All other components within normal limits  LIPASE, BLOOD  CBC   ____________________________________________  EKG  None ____________________________________________  RADIOLOGY  KUB unremarkable ____________________________________________   PROCEDURES  Procedure(s) performed: No  Procedures   Critical Care performed: No ____________________________________________   INITIAL IMPRESSION / ASSESSMENT AND PLAN / ED COURSE  Pertinent labs & imaging results that were available during my care of the patient were reviewed by me and considered in my medical decision making (see chart for details).  Patient presents with reported difficulty swallowing as above, this appears to have been worsening over some time but became significant 3 days ago.  He also complains of constipation.  He has not been able to see his outpatient PCP because of the current coronavirus epidemic.  Differential includes esophageal stricture, achalasia, abnormality in swallowing mechanism secondary to CVA  Pending labs, KUB  KUB  reassuring, no constipation.  Lab work unremarkable, patient is received IV fluids, I observe the patient drinking water without any difficulty.  Discussed with gastroenterology Dr.  Tobi Bastos who notes the patient will require barium swallow but this can be done as an outpatient.  Discussed with patient's wife who agrees with plan.  Appropriate for discharge at this time    ____________________________________________   FINAL CLINICAL IMPRESSION(S) / ED DIAGNOSES  Final diagnoses:  Swallowing difficulty  Dysphagia, unspecified type        Note:  This document was prepared using Dragon voice recognition software and may include unintentional dictation errors.   Jene Every, MD 09/20/18 1444

## 2018-10-21 ENCOUNTER — Telehealth: Payer: Self-pay | Admitting: Internal Medicine

## 2018-10-21 NOTE — Telephone Encounter (Signed)
Thank you for the update.  Laila Myhre, MD [facility] Pager: [phone]  

## 2018-10-21 NOTE — Telephone Encounter (Signed)
Spoke with patient wife.  She wanted Dr. Okey Dupre to know patient has been admitted to [facility] for 16 days for neuro issues previously discussed in phone notes . She believes he is being evaluated for possible depression vs dementia.

## 2018-11-11 ENCOUNTER — Telehealth: Payer: Self-pay | Admitting: Internal Medicine

## 2018-11-11 NOTE — Telephone Encounter (Signed)
Attempted to reach [place] and no answer. Mailbox is full and cannot accept messages at this time.

## 2018-11-11 NOTE — Telephone Encounter (Signed)
Spoke with [name] at [facility]. HR with the previous BP readings was 81-84. Patient's losartan was stopped while in the hospital and is NOT taking it at this time.   She provided update on patient. First patient was diagnosed with frontal lobe dementia. Later the diagnosis was changed to severe depression and patient referred to psychiatrist. Currently, he is going to [facility] several times a week to have seizures induced in order to improve brain function.  He is usually there for up to 6 hours a day.  It seems to be helping.  Patient has been failure to thrive and is slowly gaining weight. He was only drinking about 32 oz of water a day. Today, nurse encouraged him and his wife to increase to 60 oz a day.  They also agreed to a Gatorade each day as well.  Nurse reports he is very deconditioned.  Patient reported he feels very weak and tired and is confusing this with being lightheaded. She wanted to let us know about these BP readings and updates on the patient.  Routing to Dr End for advice.

## 2018-11-11 NOTE — Telephone Encounter (Signed)
Since losartan was held during recent hospitalization at [facility], it does not appear that Jesus Bolton is on any blood pressure lowering cardiac medications.  I agree with increasing fluid intake.  No further cardiac intervention is recommended at this time.  Yvonne Kendall, MD [facility] Pager: [phone]

## 2018-11-11 NOTE — Telephone Encounter (Signed)
[name] with [facility] calling Patients BP was  Sitting 100/50 in left arm Standing 80/60 with lightheadedness and unsteadiness  If something needs to be done immediately please call patient spouse To get in touch with [name], please call [phone]

## 2018-11-12 ENCOUNTER — Ambulatory Visit: Payer: Medicare Other | Admitting: Internal Medicine

## 2018-11-12 NOTE — Telephone Encounter (Signed)
I spoke with [name]- [facility], RN and advised her of Dr. Serita Kyle recommendations.  I also called and spoke with Mrs. Gabbert and advised her of Dr. Serita Kyle recommendations.   She voices understanding and was agreeable.  She was asking to go ahead and scheduled a follow up with Dr. Okey Dupre in about 6 weeks for the patient as he is going weekly to [facility] for now for treatments.  The patient is overdue for follow up. Appt scheduled on 12/22/18 at 1:40 pm with Dr. Okey Dupre.   The patient's wife is agreeable.

## 2018-12-07 ENCOUNTER — Other Ambulatory Visit: Payer: Self-pay | Admitting: Internal Medicine

## 2018-12-07 MED ORDER — ATORVASTATIN CALCIUM 80 MG PO TABS: 80.0000 mg | ORAL_TABLET | Freq: Every day | ORAL | 3 refills | Status: DC

## 2018-12-07 NOTE — Telephone Encounter (Signed)
°*  STAT* If patient is at the pharmacy, call can be transferred to refill team.   1. Which medications need to be refilled? (please list name of each medication and dose if known) atorvastatin (LIPITOR) 80 MG 1 tablet daily   2. Which pharmacy/location (including street and city if local pharmacy) is medication to be sent to? [facility]  3. Do they need a 30 day or 90 day supply? 30 day

## 2018-12-07 NOTE — Telephone Encounter (Signed)
Refill sent for atorvastatin 80 mg  

## 2018-12-21 ENCOUNTER — Telehealth: Payer: Self-pay | Admitting: Internal Medicine

## 2018-12-21 NOTE — Telephone Encounter (Signed)

## 2018-12-22 ENCOUNTER — Encounter: Payer: Self-pay | Admitting: Internal Medicine

## 2018-12-22 ENCOUNTER — Ambulatory Visit: Payer: Medicare Other | Admitting: Internal Medicine

## 2018-12-22 ENCOUNTER — Other Ambulatory Visit: Payer: Self-pay

## 2018-12-22 VITALS — BP 140/78 | HR 77 | Ht 72.0 in | Wt 157.5 lb

## 2018-12-22 DIAGNOSIS — I5022 Chronic systolic (congestive) heart failure: Secondary | ICD-10-CM | POA: Diagnosis not present

## 2018-12-22 DIAGNOSIS — I10 Essential (primary) hypertension: Secondary | ICD-10-CM

## 2018-12-22 DIAGNOSIS — I2510 Atherosclerotic heart disease of native coronary artery without angina pectoris: Secondary | ICD-10-CM | POA: Diagnosis not present

## 2018-12-22 DIAGNOSIS — I255 Ischemic cardiomyopathy: Secondary | ICD-10-CM | POA: Diagnosis not present

## 2018-12-22 DIAGNOSIS — E785 Hyperlipidemia, unspecified: Secondary | ICD-10-CM

## 2018-12-22 NOTE — Progress Notes (Signed)
Follow-up Outpatient Visit Date: 12/22/2018  Primary Care Provider: Dione Housekeeper, MD [address] [place] [address]  Chief Complaint: F/u CAD  HPI:  Jesus Bolton is a 73 y.o. year-old male with history of CAD status post N STEMI and PCI (12/2017), ischemic cardiomyopathy, hypertension, hyperlipidemia, stage III CKD, orthostatic hypotension, memory loss, and BPH, who presents for follow-up of CAD and orthostatic hypotension.  He was last seen in our office in December, 2019, at which time he reported improvement in his orthostatic lightheadedness after weaning off of beta-blocker.  He has undergone extensive work-up of memory loss at [facility], which has been attributed to a combination of dementia and severe depression.  He and his wife have noticed significant improve since initiation of ECT.  From a heart standpoint, Jesus Bolton has been doing well.  He notes gradually improved exertional dyspnea following his 33 day hospitalization at [facility] earlier this year.  He denies chest pain, palpitations, orthopnea, and edema.  He still gets lightheaded from time to time, though this has improved with discontinuation of metoprolol and losartan, as well as standing up more slowly.  He bruises easily but has not had any significant bleeding.  --------------------------------------------------------------------------------------------------  Past Medical History:  Diagnosis Date  . BPH (benign prostatic hyperplasia)   . CAD (coronary artery disease)    a. 12/2017 NSTEMI/PCI: LM nl, LAD 40-50p, 20-92m, LCX nl, RCA 20-30ost/p, 14m (3.25x12 Xience Sierra DES - post-dil to 3.28mm), 20d.  . CKD (chronic kidney disease), stage III (HCC)   . Dementia (HCC)   . Elevated PSA   . Essential hypertension   . Indigestion   . Ischemic cardiomyopathy    a. 12/2017 Echo: EF 40-45%, diff HK, Gr1 DD, mild LVH. Mild AI. Mildly dil LA. Nl RV size/fxn. PASP 40-10mmHg; b. 04/2018 Echo: EF 35-40%, diff HK, Gr1 DD, mild  AI, mild MR, mildly dil LA, Nl RV fxn.  . Thyroid disease    Past Surgical History:  Procedure Laterality Date  . CARDIAC CATHETERIZATION    . GREEN LIGHT LASER TURP (TRANSURETHRAL RESECTION OF PROSTATE N/A 01/02/2015   Procedure: GREEN LIGHT LASER TURP (TRANSURETHRAL RESECTION OF PROSTATE;  Surgeon: Orson Ape, MD;  Location: [facility] ORS;  Service: Urology;  Laterality: N/A;  . LEFT HEART CATH AND CORONARY ANGIOGRAPHY N/A 12/23/2017   Procedure: LEFT HEART CATH AND CORONARY ANGIOGRAPHY;  Surgeon: Yvonne Kendall, MD;  Location: [facility] INVASIVE CV LAB;  Service: Cardiovascular;  Laterality: N/A;    Current Meds  Medication Sig  . aspirin 81 MG chewable tablet Chew 1 tablet (81 mg total) by mouth daily.  Marland Kitchen atorvastatin (LIPITOR) 80 MG tablet Take 1 tablet (80 mg total) by mouth daily at 6 PM.  . cyanocobalamin 1000 MCG tablet Take 1,000 mcg by mouth daily.  Marland Kitchen docusate sodium (COLACE) 100 MG capsule Take by mouth.  . mirtazapine (REMERON) 15 MG tablet Take 1.5 mg by mouth at bedtime.  . Multiple Vitamins-Minerals (CENTRUM SILVER 50+MEN) TABS Take 2 tablets by mouth daily.   . nitroGLYCERIN (NITROSTAT) 0.4 MG SL tablet Place 1 tablet (0.4 mg total) under the tongue every 5 (five) minutes as needed for chest pain.  . pantoprazole (PROTONIX) 40 MG tablet Take 40 mg by mouth daily.  . vitamin C (ASCORBIC ACID) 500 MG tablet Take 500 mg by mouth daily.  . [DISCONTINUED] ticagrelor (BRILINTA) 90 MG TABS tablet Take 1 tablet (90 mg total) by mouth 2 (two) times daily.    Allergies: Sulfa  antibiotics  Social History   Tobacco Use  . Smoking status: Never Smoker  . Smokeless tobacco: Never Used  Substance Use Topics  . Alcohol use: No  . Drug use: No    Family History  Problem Relation Age of Onset  . Cancer Mother        a. cardiac tumor    Review of Systems: Jesus Bolton lost a considerable amount of weight around the time of his hospitalization with severe depression.  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  Appetite has improved and he is gradually gaining weight back.  Otherwise, a 12-system review of systems was performed and was negative except as noted in the HPI.  --------------------------------------------------------------------------------------------------  Physical Exam: BP 140/78 (BP Location: Left Arm, Patient Position: Sitting, Cuff Size: Normal)   Pulse 77   Ht 6' (1.829 m)   Wt 157 lb 8 oz (71.4 kg)   BMI 21.36 kg/m   General:  NAD.  Accompanied by his wife HEENT: No conjunctival pallor or scleral icterus. Moist mucous membranes.  OP clear. Neck: Supple without lymphadenopathy, thyromegaly, JVD, or HJR. Lungs: Normal work of breathing. Clear to auscultation bilaterally without wheezes or crackles. Heart: Regular rate and rhythm without murmurs, rubs, or gallops. Non-displaced PMI. Abd: Bowel sounds present. Soft, NT/ND without hepatosplenomegaly Ext: Trace pretibial edema. Radial, PT, and DP pulses are 2+ bilaterally. Skin: Warm and dry without rash.  EKG:  NSR with left axis deviation.  Otherwise normal EKG.  Lab Results  Component Value Date   WBC 7.7 09/20/2018   HGB 15.1 09/20/2018   HCT 46.6 09/20/2018   MCV 92.5 09/20/2018   PLT 318 09/20/2018    Lab Results  Component Value Date   NA 141 09/20/2018   K 4.3 09/20/2018   CL 105 09/20/2018   CO2 21 (L) 09/20/2018   BUN 39 (H) 09/20/2018   CREATININE 1.65 (H) 09/20/2018   GLUCOSE 88 09/20/2018   ALT 14 09/20/2018    Lab Results  Component Value Date   CHOL 163 12/23/2017   HDL 32 (L) 12/23/2017   LDLCALC 95 12/23/2017   TRIG 179 (H) 12/23/2017   CHOLHDL 5.1 12/23/2017    --------------------------------------------------------------------------------------------------  ASSESSMENT AND PLAN: Coronary artery disease: Jesus Bolton is doing well following NSTEMI ~1 year ago.  He denies chest pain but notes exertional dyspnea that I suspect is most likely  due to deconditioning from profound weight loss and lengthy hospitalization earlier this year.  We have agreed to discontinue ticagrelor when he has exhausted his current supply.  He should remain on indefinite low-dose aspirin and statin therapy.  Hypertension: BP mildly elevated today but typically lower at home.  Given significant orthostatic lightheadedness in the past prompting discontinuation of metoprolol and losartan in the past, we will defer adding medications at this time.  Chronic systolic heart failure due to ischemic cardiomyopathy: LVEF noted to be moderately reduced on most recent echo in 04/2018.  Patient has been intolerant of evidence-based heart failure therapy and feels better since discontinuation of beta-blockers and losartan.  He appears euvolemic today with NYHA class II-III symptoms.  No medication changes or further testing at this time, as he continues to undergo treatment of severe depression and dementia at [facility].  Hyperlipidemia: LDL at goal on most recent check through [facility] (LDL < 70).  Continue high-intensity statin therapy.  Follow-up: Return to clinic in 6 months.  Nelva Bush, MD 12/22/2018 9:13 PM

## 2018-12-22 NOTE — Patient Instructions (Signed)
Medication Instructions:  Your physician has recommended you make the following change in your medication:  1. Once you finish current supply of Brilinta then discontinue.  If you need a refill on your cardiac medications before your next appointment, please call your pharmacy.   Lab work: None If you have labs (blood work) drawn today and your tests are completely normal, you will receive your results only by: Marland Kitchen MyChart Message (if you have MyChart) OR . A paper copy in the mail If you have any lab test that is abnormal or we need to change your treatment, we will call you to review the results.  Testing/Procedures: None  Follow-Up: At [facility], you and your health needs are our priority.  As part of our continuing mission to provide you with exceptional heart care, we have created designated Provider Care Teams.  These Care Teams include your primary Cardiologist (physician) and Advanced Practice Providers (APPs -  Physician Assistants and Nurse Practitioners) who all work together to provide you with the care you need, when you need it. You will need a follow up appointment in 6 months.  Please call our office 2 months in advance to schedule this appointment.  You may see Nelva Bush, MD or one of the following Advanced Practice Providers on your designated Care Team:   Murray Hodgkins, NP Christell Faith, PA-C . Marrianne Mood, PA-C  Any Other Special Instructions Will Be Listed Below (If Applicable).

## 2018-12-23 ENCOUNTER — Encounter: Payer: Self-pay | Admitting: Internal Medicine

## 2018-12-23 DIAGNOSIS — I2510 Atherosclerotic heart disease of native coronary artery without angina pectoris: Secondary | ICD-10-CM | POA: Insufficient documentation

## 2019-05-18 ENCOUNTER — Telehealth: Payer: Self-pay | Admitting: Internal Medicine

## 2019-05-18 ENCOUNTER — Encounter: Payer: Self-pay | Admitting: Internal Medicine

## 2019-05-18 ENCOUNTER — Ambulatory Visit (INDEPENDENT_AMBULATORY_CARE_PROVIDER_SITE_OTHER): Payer: Medicare Other | Admitting: Internal Medicine

## 2019-05-18 ENCOUNTER — Other Ambulatory Visit: Payer: Self-pay

## 2019-05-18 VITALS — BP 164/86 | HR 61 | Ht 72.0 in | Wt 183.5 lb

## 2019-05-18 DIAGNOSIS — I2510 Atherosclerotic heart disease of native coronary artery without angina pectoris: Secondary | ICD-10-CM

## 2019-05-18 DIAGNOSIS — I10 Essential (primary) hypertension: Secondary | ICD-10-CM

## 2019-05-18 DIAGNOSIS — E785 Hyperlipidemia, unspecified: Secondary | ICD-10-CM

## 2019-05-18 MED ORDER — AMLODIPINE BESYLATE 2.5 MG PO TABS: 2.5000 mg | ORAL_TABLET | Freq: Every day | ORAL | 4 refills | Status: DC

## 2019-05-18 NOTE — Patient Instructions (Signed)
Dr End recommends you follow a low sodium diet.   Medication Instructions:  Your physician has recommended you make the following change in your medication:  1- START Amlodipine 2.5 mg (1 tablet) by mouth once a day.  *If you need a refill on your cardiac medications before your next appointment, please call your pharmacy*  Lab Work: none If you have labs (blood work) drawn today and your tests are completely normal, you will receive your results only by: Marland Kitchen MyChart Message (if you have MyChart) OR . A paper copy in the mail If you have any lab test that is abnormal or we need to change your treatment, we will call you to review the results.  Testing/Procedures: none  Follow-Up: At [facility], you and your health needs are our priority.  As part of our continuing mission to provide you with exceptional heart care, we have created designated Provider Care Teams.  These Care Teams include your primary Cardiologist (physician) and Advanced Practice Providers (APPs -  Physician Assistants and Nurse Practitioners) who all work together to provide you with the care you need, when you need it.  Your next appointment:   2 week(s)  The format for your next appointment:   Virtual Visit   Provider:    You may see Nelva Bush, MD or one of the following Advanced Practice Providers on your designated Care Team:    Murray Hodgkins, NP  Christell Faith, PA-C  Marrianne Mood, PA-C

## 2019-05-18 NOTE — Telephone Encounter (Signed)

## 2019-05-18 NOTE — Progress Notes (Signed)
Follow-up Outpatient Visit Date: 05/18/2019  Primary Care Provider: Dione Housekeeper, MD [address] [place] [address]  Chief Complaint: Elevated blood pressure  HPI:  Mr. Broaddus is a 73 y.o. male with history of CAD status post NSTEMI and PCI (12/2017), ischemic cardiomyopathy, hypertension, hyperlipidemia, stage III CKD, orthostatic hypotension, memory loss, and BPH, who presents for urgent evaluation of elevated blood pressure.  Mr. Cupples presented for ECT this morning at [facility] and was noted to have markedly elevated blood pressure (up to 217/92).  Mr. Alfredia Client reported feeling anxious about his appointment but was otherwise without symptoms.  ECT was cancelled.  His wife notes that Mr. Thayer blood pressure has been increasing gradually over the last few months.  Of note, losartan and HCTZ were previously discontinued due to dizziness and soft blood pressure.  Mr. Gittleman feels well at this time.  He denies chest pain, shortness of breath, palpitations, lightheadedness, and edema.  He has been compliant with his medications.  He typically follows a low sodium diet but has been more liberal with his sodium consumption the last few weeks.  He notes having eaten ham yesterday.  --------------------------------------------------------------------------------------------------  Past Medical History:  Diagnosis Date  . BPH (benign prostatic hyperplasia)   . CAD (coronary artery disease)    a. 12/2017 NSTEMI/PCI: LM nl, LAD 40-50p, 20-8m, LCX nl, RCA 20-30ost/p, 69m (3.25x12 Xience Sierra DES - post-dil to 3.82mm), 20d.  . CKD (chronic kidney disease), stage III   . Dementia (HCC)   . Elevated PSA   . Essential hypertension   . Indigestion   . Ischemic cardiomyopathy    a. 12/2017 Echo: EF 40-45%, diff HK, Gr1 DD, mild LVH. Mild AI. Mildly dil LA. Nl RV size/fxn. PASP 40-52mmHg; b. 04/2018 Echo: EF 35-40%, diff HK, Gr1 DD, mild AI, mild MR, mildly dil LA, Nl RV fxn.  . Thyroid disease     Past Surgical History:  Procedure Laterality Date  . CARDIAC CATHETERIZATION    . GREEN LIGHT LASER TURP (TRANSURETHRAL RESECTION OF PROSTATE N/A 01/02/2015   Procedure: GREEN LIGHT LASER TURP (TRANSURETHRAL RESECTION OF PROSTATE;  Surgeon: Orson Ape, MD;  Location: [facility] ORS;  Service: Urology;  Laterality: N/A;  . LEFT HEART CATH AND CORONARY ANGIOGRAPHY N/A 12/23/2017   Procedure: LEFT HEART CATH AND CORONARY ANGIOGRAPHY;  Surgeon: Yvonne Kendall, MD;  Location: [facility] INVASIVE CV LAB;  Service: Cardiovascular;  Laterality: N/A;    Current Meds  Medication Sig  . allopurinol (ZYLOPRIM) 100 MG tablet Take by mouth daily.  Marland Kitchen aspirin 81 MG chewable tablet Chew 1 tablet (81 mg total) by mouth daily.  Marland Kitchen atorvastatin (LIPITOR) 80 MG tablet Take 1 tablet (80 mg total) by mouth daily at 6 PM.  . cyanocobalamin 1000 MCG tablet Take 1,000 mcg by mouth daily.  . mirtazapine (REMERON) 15 MG tablet Take 1.5 mg by mouth at bedtime.  . Multiple Vitamins-Minerals (CENTRUM SILVER 50+MEN) TABS Take 2 tablets by mouth daily.   . nitroGLYCERIN (NITROSTAT) 0.4 MG SL tablet Place 1 tablet (0.4 mg total) under the tongue every 5 (five) minutes as needed for chest pain.  . pantoprazole (PROTONIX) 40 MG tablet Take 40 mg by mouth daily.  . vitamin C (ASCORBIC ACID) 500 MG tablet Take 500 mg by mouth daily.    Allergies: Sulfa antibiotics  Social History   Tobacco Use  . Smoking status: Never Smoker  . Smokeless tobacco: Never Used  Substance Use Topics  . Alcohol use: No  .  Drug use: No    Family History  Problem Relation Age of Onset  . Cancer Mother        a. cardiac tumor    Review of Systems: A 12-system review of systems was performed and was negative except as noted in the HPI.  --------------------------------------------------------------------------------------------------  Physical Exam: BP (!) 164/86 (BP Location: Left Arm, Patient Position: Sitting, Cuff Size: Normal)   Pulse  61   Ht 6' (1.829 m)   Wt 183 lb 8 oz (83.2 kg)   SpO2 96%   BMI 24.89 kg/m   General:  NAD. HEENT: No conjunctival pallor or scleral icterus. Facemask in place. Neck: Supple without lymphadenopathy, thyromegaly, JVD, or HJR. Lungs: Normal work of breathing. Clear to auscultation bilaterally without wheezes or crackles. Heart: Regular rate and rhythm without murmurs, rubs, or gallops. Non-displaced PMI. Abd: Bowel sounds present. Soft, NT/ND without hepatosplenomegaly Ext: No lower extremity edema. Radial, PT, and DP pulses are 2+ bilaterally. Skin: Warm and dry without rash.  EKG:  NSR with borderline LVH.  Otherwise, no significant abnormality.  Lab Results  Component Value Date   WBC 7.7 09/20/2018   HGB 15.1 09/20/2018   HCT 46.6 09/20/2018   MCV 92.5 09/20/2018   PLT 318 09/20/2018    Lab Results  Component Value Date   NA 141 09/20/2018   K 4.3 09/20/2018   CL 105 09/20/2018   CO2 21 (L) 09/20/2018   BUN 39 (H) 09/20/2018   CREATININE 1.65 (H) 09/20/2018   GLUCOSE 88 09/20/2018   ALT 14 09/20/2018    Lab Results  Component Value Date   CHOL 163 12/23/2017   HDL 32 (L) 12/23/2017   LDLCALC 95 12/23/2017   TRIG 179 (H) 12/23/2017   CHOLHDL 5.1 12/23/2017    --------------------------------------------------------------------------------------------------  ASSESSMENT AND PLAN: Hypertension: BP has been climbing over the last few months per Mr. Lacomb and his wife.  I stressed the importance of sodium restriction.  We have agreed to start amlodipine 2.5 mg daily and assess his response with a virtual visit in ~2 weeks.  Coronary artery disease: No symptoms to suggest worsening coronary insufficiency.  Continue current medications for secondary prevention.  Hyperlipidemia: LDL at goal on last check with PCP in [place] (LDL 53).  Continue atorvastatin 80 mg daily.  Follow-up: Virtual visit in 2 weeks with me or APP.  Nelva Bush, MD 05/18/2019 2:24 PM

## 2019-05-18 NOTE — Telephone Encounter (Signed)
Pt c/o BP issue: STAT if pt c/o blurred vision, one-sided weakness or slurred speech  1. What are your last 5 BP readings? 195/95 217/92   2. Are you having any other symptoms (ex. Dizziness, headache, blurred vision, passed out)? No   3. What is your BP issue? Elevated bp patient ECT cancelled due to bp wants asap resolution  Added on to opening with End at 220 today patient confirmed

## 2019-05-19 ENCOUNTER — Encounter: Payer: Self-pay | Admitting: Internal Medicine

## 2019-05-19 NOTE — Telephone Encounter (Signed)
Patient was seen in clinic yesterday by Dr End. His issues were addressed. Closing encounter.

## 2019-06-02 ENCOUNTER — Telehealth (INDEPENDENT_AMBULATORY_CARE_PROVIDER_SITE_OTHER): Payer: Medicare Other | Admitting: Nurse Practitioner

## 2019-06-02 ENCOUNTER — Other Ambulatory Visit: Payer: Self-pay

## 2019-06-02 ENCOUNTER — Encounter: Payer: Self-pay | Admitting: Nurse Practitioner

## 2019-06-02 VITALS — BP 143/82 | HR 74 | Ht 72.0 in | Wt 181.0 lb

## 2019-06-02 DIAGNOSIS — I252 Old myocardial infarction: Secondary | ICD-10-CM

## 2019-06-02 DIAGNOSIS — I5042 Chronic combined systolic (congestive) and diastolic (congestive) heart failure: Secondary | ICD-10-CM

## 2019-06-02 DIAGNOSIS — I255 Ischemic cardiomyopathy: Secondary | ICD-10-CM | POA: Diagnosis not present

## 2019-06-02 DIAGNOSIS — I2510 Atherosclerotic heart disease of native coronary artery without angina pectoris: Secondary | ICD-10-CM

## 2019-06-02 DIAGNOSIS — I10 Essential (primary) hypertension: Secondary | ICD-10-CM

## 2019-06-02 DIAGNOSIS — Z7982 Long term (current) use of aspirin: Secondary | ICD-10-CM

## 2019-06-02 DIAGNOSIS — Z79899 Other long term (current) drug therapy: Secondary | ICD-10-CM

## 2019-06-02 DIAGNOSIS — I110 Hypertensive heart disease with heart failure: Secondary | ICD-10-CM

## 2019-06-02 DIAGNOSIS — E785 Hyperlipidemia, unspecified: Secondary | ICD-10-CM

## 2019-06-02 NOTE — Patient Instructions (Signed)
Medication Instructions:  Your physician recommends that you continue on your current medications as directed. Please refer to the Current Medication list given to you today.  *If you need a refill on your cardiac medications before your next appointment, please call your pharmacy*  Lab Work: None ordered  If you have labs (blood work) drawn today and your tests are completely normal, you will receive your results only by: Marland Kitchen MyChart Message (if you have MyChart) OR . A paper copy in the mail If you have any lab test that is abnormal or we need to change your treatment, we will call you to review the results.  Testing/Procedures: None ordered   Follow-Up: At [facility], you and your health needs are our priority.  As part of our continuing mission to provide you with exceptional heart care, we have created designated Provider Care Teams.  These Care Teams include your primary Cardiologist (physician) and Advanced Practice Providers (APPs -  Physician Assistants and Nurse Practitioners) who all work together to provide you with the care you need, when you need it.  Your next appointment:   4-6 months  The format for your next appointment:   In Person  Provider:    You may see Nelva Bush, MD or Murray Hodgkins, NP.

## 2019-06-02 NOTE — Progress Notes (Signed)
Virtual Visit via Telephone Note   This visit type was conducted due to national recommendations for restrictions regarding the COVID-19 Pandemic (e.g. social distancing) in an effort to limit this patient's exposure and mitigate transmission in our community.  Due to his co-morbid illnesses, this patient is at least at moderate risk for complications without adequate follow up.  This format is felt to be most appropriate for this patient at this time.  The patient did not have access to video technology/had technical difficulties with video requiring transitioning to audio format only (telephone).  All issues noted in this document were discussed and addressed.  No physical exam could be performed with this format.  Please refer to the patient's chart for his  consent to telehealth for [facility]. Evaluation Performed:  Follow-up visit  This visit type was conducted due to national recommendations for restrictions regarding the COVID-19 Pandemic (e.g. social distancing).  This format is felt to be most appropriate for this patient at this time.  All issues noted in this document were discussed and addressed.  No physical exam was performed (except for noted visual exam findings with Video Visits).  Please refer to the patient's chart (MyChart message for video visits and phone note for telephone visits) for the patient's consent to telehealth for [facility]. _____________   Date:  06/02/2019   Patient ID:  Jesus Bolton, DOB Nov 13, 1945, MRN [id number] Patient Location:  Home Provider location:   Office  Primary Care Provider:  Dione Housekeeper, MD Primary Cardiologist:  Yvonne Kendall, MD  Chief Complaint    73 year old male with a history of CAD status post non-STEMI July 2019, ischemic cardiomyopathy, chronic combined systolic and diastolic congestive heart failure, hypertension, hyperlipidemia, stage III chronic kidney disease, orthostatic hypotension, memory loss, and BPH,  who presents for follow-up related to HTN.  Past Medical History    Past Medical History:  Diagnosis Date  . BPH (benign prostatic hyperplasia)   . CAD (coronary artery disease)    a. 12/2017 NSTEMI/PCI: LM nl, LAD 40-50p, 20-77m, LCX nl, RCA 20-30ost/p, 55m (3.25x12 Xience Sierra DES - post-dil to 3.70mm), 20d.  . Chronic combined systolic (congestive) and diastolic (congestive) heart failure (HCC)   . CKD (chronic kidney disease), stage III   . Dementia (HCC)   . Depression   . Elevated PSA   . Essential hypertension   . Hyperlipidemia LDL goal <70   . Indigestion   . Ischemic cardiomyopathy    a. 12/2017 Echo: EF 40-45%, diff HK, Gr1 DD, mild LVH. Mild AI. Mildly dil LA. Nl RV size/fxn. PASP 40-61mmHg; b. 04/2018 Echo: EF 35-40%, diff HK, Gr1 DD, mild AI, mild MR, mildly dil LA, Nl RV fxn.  . Thyroid disease    Past Surgical History:  Procedure Laterality Date  . CARDIAC CATHETERIZATION    . GREEN LIGHT LASER TURP (TRANSURETHRAL RESECTION OF PROSTATE N/A 01/02/2015   Procedure: GREEN LIGHT LASER TURP (TRANSURETHRAL RESECTION OF PROSTATE;  Surgeon: Orson Ape, MD;  Location: [facility] ORS;  Service: Urology;  Laterality: N/A;  . LEFT HEART CATH AND CORONARY ANGIOGRAPHY N/A 12/23/2017   Procedure: LEFT HEART CATH AND CORONARY ANGIOGRAPHY;  Surgeon: Yvonne Kendall, MD;  Location: [facility] INVASIVE CV LAB;  Service: Cardiovascular;  Laterality: N/A;    Allergies  Allergies  Allergen Reactions  . Sulfa Antibiotics Rash    History of Present Illness    Jesus Bolton is a 73 y.o. male who presents via audio/video conferencing for a  telehealth visit today.  He has the above past medical history including coronary artery disease status post non-STEMI in July 2019 with drug-eluting stent placement to the mid right coronary artery.  EF was noted to be 40 to 45% at that time, consistent with ischemic cardiomyopathy.  Other history includes hypertension, hyperlipidemia, combined heart failure,  stage III chronic kidney disease, BPH, and orthostatic hypotension.  Orthostasis has previously limited medical therapy requiring discontinuation of beta-blocker and ARB therapy previously.  This improved orthostatic symptoms significantly.  However, he was recently seen December 2, after presenting for ECT at [facility] and being noted to be markedly hypertensive with a pressure of 217/92.  ECT was canceled and he saw Dr. Saunders Revel later that day.  Low-dose amlodipine was added.  He has been following his blood pressure 2-3x/day since his last visit.. Highest systolic was 546, and the lowest was 118.  He says that on average, pressures are generally trending 130-135/80.  He notes that he is tolerating the low-dose of amlodipine well and has not had any significant orthostatic symptoms though he does have some degree of chronic lightheadedness with position changes.  He denies chest pain, dyspnea, palpitations, PND, orthopnea, dizziness, syncope, or early satiety.  He sometimes notes mild swelling above his sock line which has not changed since starting amlodipine. The patient does not have symptoms concerning for COVID-19 infection (fever, chills, cough, or new shortness of breath).   Home Medications    Prior to Admission medications   Medication Sig Start Date End Date Taking? Authorizing Provider  allopurinol (ZYLOPRIM) 100 MG tablet Take by mouth daily. 05/16/19 11/12/19 Yes [provider]  amLODipine (NORVASC) 2.5 MG tablet Take 1 tablet (2.5 mg total) by mouth daily. 05/18/19 08/16/19 Yes End, Harrell Gave, MD  aspirin 81 MG chewable tablet Chew 1 tablet (81 mg total) by mouth daily. 12/24/17  Yes Loletha Grayer, MD  atorvastatin (LIPITOR) 80 MG tablet Take 1 tablet (80 mg total) by mouth daily at 6 PM. 12/07/18  Yes End, Harrell Gave, MD  cyanocobalamin 1000 MCG tablet Take 1,000 mcg by mouth daily.   Yes [provider]  MIRTAZAPINE PO Take 1 1/2 (22.5MG ) tablet by mouth daily.   Yes  [provider]  Multiple Vitamins-Minerals (CENTRUM SILVER 50+MEN) TABS Take 2 tablets by mouth daily.    Yes [provider]  nitroGLYCERIN (NITROSTAT) 0.4 MG SL tablet Place 1 tablet (0.4 mg total) under the tongue every 5 (five) minutes as needed for chest pain. 12/24/17  Yes Wieting, Richard, MD  pantoprazole (PROTONIX) 40 MG tablet Take 40 mg by mouth daily.   Yes [provider]  vitamin C (ASCORBIC ACID) 500 MG tablet Take 500 mg by mouth daily.   Yes [provider]    Review of Systems    He denies chest pain, palpitations, dyspnea, pnd, orthopnea, n, v, dizziness, syncope, weight gain, or early satiety.  He notes occasional, mild swelling above his sock line if has been on his feet all day.  This is unchanged since starting amlodipine.  All other systems reviewed and are otherwise negative except as noted above.  Physical Exam    Vital Signs:  BP (!) 143/82 (BP Location: Left Arm, Patient Position: Sitting, Cuff Size: Normal)   Pulse 74   Ht 6' (1.829 m)   Wt 181 lb (82.1 kg)   BMI 24.55 kg/m    Pleasant, no acute distress.  Awake, alert, and oriented x3.  Speech is unlabored and respirations regular.  Accessory Clinical Findings    None  Assessment & Plan    1.  Essential hypertension: Patient was seen on December 2 in the setting of new hypertension.  He previously experienced orthostasis on beta-blocker and ARB therapy leading to their discontinuation.  In the setting of hypertension at his last visit, low-dose amlodipine was added.  He has been tracking his blood pressure 2-3 times per day and has generally been trending in the low 130s over 80.  Pressure is slightly higher this morning.  He has not had any significant orthostatic symptoms.  Given reasonable trends and prior history of orthostasis, I will not titrate amlodipine any further today.  He will continue to check his blood pressure at least daily and contact us if trends elevated  into the 140s.  2.  Coronary artery disease: Status post non-STEMI in July 2019 with stenting of the right coronary artery at that time.  He has been doing well without chest pain or dyspnea and remains on aspirin and statin therapy.  3.  Ischemic cardiomyopathy/chronic combined systolic and diastolic congestive heart failure: EF 35 to 40% with grade 1 diastolic dysfunction by echo in November 2019.  He did not tolerate beta-blocker or ARB therapy secondary to orthostasis and fatigue.  He has been doing well at home without dyspnea or orthopnea.  No changes today.  4.  Hyperlipidemia: LDL was 53 in March with normal AST/ALT in August.  Continue statin therapy.  5.  Disposition: Patient will continue to follow blood pressure at home and contact us if trends elevate.  Otherwise, follow-up in clinic in 4-6 months or sooner if necessary.  COVID-19 Education: The signs and symptoms of COVID-19 were discussed with the patient and how to seek care for testing (follow up with PCP or arrange E-visit).  The importance of social distancing was discussed today.  Patient Risk:   After full review of this patient's history and clinical status, I feel that he is at least moderate risk for cardiac complications at this time, thus necessitating a telehealth visit sooner than our first available in office visit.  Time:   Today, I have spent 15 minutes with the patient with telehealth technology discussing medical history, symptoms, and management plan.     Nicolasa Ducking, NP 06/02/2019, 10:50 AM

## 2019-08-02 LAB — COLOGUARD: COLOGUARD: NEGATIVE

## 2019-10-07 ENCOUNTER — Ambulatory Visit (INDEPENDENT_AMBULATORY_CARE_PROVIDER_SITE_OTHER): Payer: Medicare PPO | Admitting: Internal Medicine

## 2019-10-07 ENCOUNTER — Encounter: Payer: Self-pay | Admitting: Internal Medicine

## 2019-10-07 ENCOUNTER — Other Ambulatory Visit: Payer: Self-pay

## 2019-10-07 VITALS — BP 158/82 | HR 66 | Ht 72.0 in | Wt 195.0 lb

## 2019-10-07 DIAGNOSIS — I10 Essential (primary) hypertension: Secondary | ICD-10-CM | POA: Diagnosis not present

## 2019-10-07 DIAGNOSIS — I5022 Chronic systolic (congestive) heart failure: Secondary | ICD-10-CM | POA: Diagnosis not present

## 2019-10-07 DIAGNOSIS — E785 Hyperlipidemia, unspecified: Secondary | ICD-10-CM

## 2019-10-07 DIAGNOSIS — I2510 Atherosclerotic heart disease of native coronary artery without angina pectoris: Secondary | ICD-10-CM

## 2019-10-07 MED ORDER — ATORVASTATIN CALCIUM 40 MG PO TABS: 40.0000 mg | ORAL_TABLET | Freq: Every day | ORAL | 1 refills | Status: DC

## 2019-10-07 MED ORDER — NITROGLYCERIN 0.4 MG SL SUBL: 0.4000 mg | SUBLINGUAL_TABLET | SUBLINGUAL | 2 refills | Status: DC | PRN

## 2019-10-07 MED ORDER — AMLODIPINE BESYLATE 5 MG PO TABS: 5.0000 mg | ORAL_TABLET | Freq: Every day | ORAL | 1 refills | Status: DC

## 2019-10-07 NOTE — Patient Instructions (Signed)
Medication Instructions:  Your physician has recommended you make the following change in your medication:  1- INCREASE Amlodipine to 5 mg by mouth once a day. 2- DECREASE Atorvastatin to 40 mg by mouth once a day. 3- Refilled Nitroglycerin 0.4 mg tablets - Dissolve one tablet under the tongue every 5 minutes AS NEEDED for chest pain. No more than 3 doses. If chest pain does not resolve, then call 911 or go to the Emergency room.   *If you need a refill on your cardiac medications before your next appointment, please call your pharmacy*  Follow-Up: At [facility], you and your health needs are our priority.  As part of our continuing mission to provide you with exceptional heart care, we have created designated Provider Care Teams.  These Care Teams include your primary Cardiologist (physician) and Advanced Practice Providers (APPs -  Physician Assistants and Nurse Practitioners) who all work together to provide you with the care you need, when you need it.  We recommend signing up for the patient portal called "MyChart".  Sign up information is provided on this After Visit Summary.  MyChart is used to connect with patients for Virtual Visits (Telemedicine).  Patients are able to view lab/test results, encounter notes, upcoming appointments, etc.  Non-urgent messages can be sent to your provider as well.   To learn more about what you can do with MyChart, go to [email or web address].    Your next appointment:   6 month(s)  The format for your next appointment:   In Person  Provider:    You may see Yvonne Kendall, MD or one of the following Advanced Practice Providers on your designated Care Team:    Nicolasa Ducking, NP  Eula Listen, PA-C  Marisue Ivan, PA-C

## 2019-10-07 NOTE — Progress Notes (Signed)
Follow-up Outpatient Visit Date: 10/07/2019  Primary Care Provider: Debbra Riding [facility], PA-C [address] [place] [address]  Chief Complaint: Follow-up CAD and HTN  HPI:  Jesus Bolton is a 74 y.o. male with history of CAD status post NSTEMI and PCI (12/2017), ischemic cardiomyopathy, hypertension, hyperlipidemia, stage III CKD, orthostatic hypotension, memory loss, and BPH, who presents for follow-up of coronary artery disease and hypertension.  I last saw Jesus Bolton in early December for urgent evaluation of markedly elevated blood pressure.  We agreed to start amlodipine 2.5 mg daily.  Blood pressure was better at follow-up virtual visit 2 weeks later.  No medication changes were made at that time.  Jesus Bolton reports doing well today.  He is hoping to discontinue ECT treatments, as he feels like he is essentially back to baseline.  His blood pressure has improved with amlodipine but is still borderline to mildly elevated at times.  He has not had any side effects from the medication.  He has trace leg edema at times, which is stable.  Jesus Bolton denies chest pain, shortness of breath, palpitations, and lightheadedness.  He has started doing some resistance exercises and is trying to walk as the weather allows.  He has tried to cut down on his salt intake but has not eliminated adding salt completely.  --------------------------------------------------------------------------------------------------  Past Medical History:  Diagnosis Date  . BPH (benign prostatic hyperplasia)   . CAD (coronary artery disease)    a. 12/2017 NSTEMI/PCI: LM nl, LAD 40-50p, 20-19m, LCX nl, RCA 20-30ost/p, 27m (3.25x12 Xience Sierra DES - post-dil to 3.88mm), 20d.  . Chronic combined systolic (congestive) and diastolic (congestive) heart failure (HCC)   . CKD (chronic kidney disease), stage III   . Dementia (HCC)   . Depression   . Elevated PSA   . Essential hypertension   . Hyperlipidemia LDL goal <70     . Indigestion   . Ischemic cardiomyopathy    a. 12/2017 Echo: EF 40-45%, diff HK, Gr1 DD, mild LVH. Mild AI. Mildly dil LA. Nl RV size/fxn. PASP 40-52mmHg; b. 04/2018 Echo: EF 35-40%, diff HK, Gr1 DD, mild AI, mild MR, mildly dil LA, Nl RV fxn.  . Thyroid disease    Past Surgical History:  Procedure Laterality Date  . CARDIAC CATHETERIZATION    . GREEN LIGHT LASER TURP (TRANSURETHRAL RESECTION OF PROSTATE N/A 01/02/2015   Procedure: GREEN LIGHT LASER TURP (TRANSURETHRAL RESECTION OF PROSTATE;  Surgeon: Orson Ape, MD;  Location: [facility] ORS;  Service: Urology;  Laterality: N/A;  . LEFT HEART CATH AND CORONARY ANGIOGRAPHY N/A 12/23/2017   Procedure: LEFT HEART CATH AND CORONARY ANGIOGRAPHY;  Surgeon: Yvonne Kendall, MD;  Location: [facility] INVASIVE CV LAB;  Service: Cardiovascular;  Laterality: N/A;    Current Meds  Medication Sig  . allopurinol (ZYLOPRIM) 100 MG tablet Take by mouth daily.  Marland Kitchen amLODipine (NORVASC) 5 MG tablet Take 1 tablet (5 mg total) by mouth daily.  Marland Kitchen aspirin 81 MG chewable tablet Chew 1 tablet (81 mg total) by mouth daily.  Marland Kitchen atorvastatin (LIPITOR) 40 MG tablet Take 1 tablet (40 mg total) by mouth daily at 6 PM.  . cyanocobalamin 1000 MCG tablet Take 1,000 mcg by mouth daily.  Marland Kitchen MIRTAZAPINE PO Take 1 1/2 (22.5MG ) tablet by mouth daily.  . Multiple Vitamins-Minerals (CENTRUM SILVER 50+MEN) TABS Take 1 tablet by mouth daily.   . nitroGLYCERIN (NITROSTAT) 0.4 MG SL tablet Place 1 tablet (0.4 mg total) under the tongue every 5 (  five) minutes as needed for chest pain. Maximum of 3 doses.  . pantoprazole (PROTONIX) 40 MG tablet Take 40 mg by mouth daily.  . vitamin C (ASCORBIC ACID) 500 MG tablet Take 500 mg by mouth daily.  . [DISCONTINUED] amLODipine (NORVASC) 2.5 MG tablet Take 1 tablet (2.5 mg total) by mouth daily.  . [DISCONTINUED] atorvastatin (LIPITOR) 80 MG tablet Take 1 tablet (80 mg total) by mouth daily at 6 PM.  . [DISCONTINUED] nitroGLYCERIN (NITROSTAT) 0.4 MG  SL tablet Place 1 tablet (0.4 mg total) under the tongue every 5 (five) minutes as needed for chest pain.    Allergies: Sulfa antibiotics  Social History   Tobacco Use  . Smoking status: Never Smoker  . Smokeless tobacco: Never Used  Substance Use Topics  . Alcohol use: No  . Drug use: No    Family History  Problem Relation Age of Onset  . Cancer Mother        a. cardiac tumor    Review of Systems: A 12-system review of systems was performed and was negative except as noted in the HPI.  --------------------------------------------------------------------------------------------------  Physical Exam: BP (!) 158/82 (BP Location: Left Arm, Patient Position: Sitting, Cuff Size: Normal)   Pulse 66   Ht 6' (1.829 m)   Wt 195 lb (88.5 kg)   SpO2 98%   BMI 26.45 kg/m   General:  NAD.  Accompanied by his wife. HEENT: No conjunctival pallor or scleral icterus. Facemask in place. Neck: No JVD or HJR. Lungs: Normal work of breathing. Clear to auscultation bilaterally without wheezes or crackles. Heart: Regular rate and rhythm without murmurs, rubs, or gallops. Non-displaced PMI. Abd: Bowel sounds present. Soft, NT/ND without hepatosplenomegaly Ext: Trace pretibial edema bilaterally.  EKG:  NSR with LVH.  No significant change since 05/18/2019.  Lab Results  Component Value Date   WBC 7.7 09/20/2018   HGB 15.1 09/20/2018   HCT 46.6 09/20/2018   MCV 92.5 09/20/2018   PLT 318 09/20/2018    Lab Results  Component Value Date   NA 141 09/20/2018   K 4.3 09/20/2018   CL 105 09/20/2018   CO2 21 (L) 09/20/2018   BUN 39 (H) 09/20/2018   CREATININE 1.65 (H) 09/20/2018   GLUCOSE 88 09/20/2018   ALT 14 09/20/2018    Lab Results  Component Value Date   CHOL 163 12/23/2017   HDL 32 (L) 12/23/2017   LDLCALC 95 12/23/2017   TRIG 179 (H) 12/23/2017   CHOLHDL 5.1 12/23/2017    Outside lipid panel (07/19/19): Total cholesterol 104, HDL 30, LDL 21, triglycerides  267  --------------------------------------------------------------------------------------------------  ASSESSMENT AND PLAN: Coronary artery disease: No angina reported.  Continue aspirin and atorvastatin for secondary prevention, though I will decrease the latter to 40 mg daily, as LDL on most recent outside labs in 07/2019 was 21.  Hypertension: Blood pressure improved but still above goal.  We will increase amlodipine to 5 mg daily, given lightheadedness in the past with multiple other agents.  Sodium restriction encouraged.  Chronic systolic heart failure: Mr. Brailsford appears euvolemic and well-compensated with NYHA class I-II symptoms.  He has been intolerant of evidence base HF therapy in the past.  As above, we will work on improving BP control with amlodipine.  Hyperlipidemia: LDL excellent on last check through PCP in February (21).  We will decrease atorvastatin to 40 mg daily to minimize risk for medication side effects.  Follow-up: Return to clinic in 6 months.  Nelva Bush, MD 10/07/2019  8:12 PM

## 2020-04-11 ENCOUNTER — Other Ambulatory Visit: Payer: Self-pay

## 2020-04-11 ENCOUNTER — Encounter: Payer: Self-pay | Admitting: Internal Medicine

## 2020-04-11 ENCOUNTER — Ambulatory Visit: Payer: Medicare PPO | Admitting: Internal Medicine

## 2020-04-11 VITALS — BP 140/70 | HR 75 | Ht 72.0 in | Wt 204.0 lb

## 2020-04-11 DIAGNOSIS — I255 Ischemic cardiomyopathy: Secondary | ICD-10-CM

## 2020-04-11 DIAGNOSIS — I10 Essential (primary) hypertension: Secondary | ICD-10-CM

## 2020-04-11 DIAGNOSIS — I2510 Atherosclerotic heart disease of native coronary artery without angina pectoris: Secondary | ICD-10-CM | POA: Diagnosis not present

## 2020-04-11 DIAGNOSIS — I5022 Chronic systolic (congestive) heart failure: Secondary | ICD-10-CM | POA: Diagnosis not present

## 2020-04-11 DIAGNOSIS — E785 Hyperlipidemia, unspecified: Secondary | ICD-10-CM

## 2020-04-11 MED ORDER — AMLODIPINE BESYLATE 5 MG PO TABS: 5.0000 mg | ORAL_TABLET | Freq: Every day | ORAL | 1 refills | Status: DC

## 2020-04-11 NOTE — Progress Notes (Signed)
Follow-up Outpatient Visit Date: 04/11/2020  Primary Care Provider: Debbra Riding [facility], PA-C [address] [place] [address]  Chief Complaint: Follow-up coronary artery disease and cardiomyopathy  HPI:  Jesus Bolton is a 74 y.o. male with history of CAD status post NSTEMI and PCI (12/2017), ischemic cardiomyopathy, hypertension, hyperlipidemia, stage III CKD, orthostatic hypotension, memory loss, and BPH, who presents for follow-up of coronary artery disease and hypertension. I last saw Jesus Bolton in April, at which time he was doing well and hoping to discontinue ECT treatments. Due to mildly elevated blood pressure, we agreed to increase amlodipine to 5 mg daily.  Today, Jesus Bolton reports he has been doing relatively well. He has experienced occasional upper chest wall pain near the articulation between his clavicles and sternum. He reports having had some x-rays done that were unrevealing. Pain comes on mostly when he coughs, sneezes, or bends in certain ways. He has not had any exertional chest pain. He also denies shortness of breath, palpitations, and lightheadedness. Home blood pressures have typically been in the 120s over 70s. He notes mild dependent edema in his ankles, which is stable. He has put on some weight, which she attributes to increased appetite in the setting of ongoing mirtazapine use. He is trying to walk more.  --------------------------------------------------------------------------------------------------  Past Medical History:  Diagnosis Date  . BPH (benign prostatic hyperplasia)   . CAD (coronary artery disease)    a. 12/2017 NSTEMI/PCI: LM nl, LAD 40-50p, 20-55m, LCX nl, RCA 20-30ost/p, 27m (3.25x12 Xience Sierra DES - post-dil to 3.34mm), 20d.  . Chronic combined systolic (congestive) and diastolic (congestive) heart failure (HCC)   . CKD (chronic kidney disease), stage III (HCC)   . Depression   . Elevated PSA   . Essential hypertension   .  Hyperlipidemia LDL goal <70   . Indigestion   . Ischemic cardiomyopathy    a. 12/2017 Echo: EF 40-45%, diff HK, Gr1 DD, mild LVH. Mild AI. Mildly dil LA. Nl RV size/fxn. PASP 40-75mmHg; b. 04/2018 Echo: EF 35-40%, diff HK, Gr1 DD, mild AI, mild MR, mildly dil LA, Nl RV fxn.  . Thyroid disease    Past Surgical History:  Procedure Laterality Date  . CARDIAC CATHETERIZATION    . GREEN LIGHT LASER TURP (TRANSURETHRAL RESECTION OF PROSTATE N/A 01/02/2015   Procedure: GREEN LIGHT LASER TURP (TRANSURETHRAL RESECTION OF PROSTATE;  Surgeon: Orson Ape, MD;  Location: [facility] ORS;  Service: Urology;  Laterality: N/A;  . LEFT HEART CATH AND CORONARY ANGIOGRAPHY N/A 12/23/2017   Procedure: LEFT HEART CATH AND CORONARY ANGIOGRAPHY;  Surgeon: Yvonne Kendall, MD;  Location: [facility] INVASIVE CV LAB;  Service: Cardiovascular;  Laterality: N/A;    Current Meds  Medication Sig  . allopurinol (ZYLOPRIM) 100 MG tablet Take by mouth daily.  Marland Kitchen amLODipine (NORVASC) 5 MG tablet Take 1 tablet (5 mg total) by mouth daily.  Marland Kitchen aspirin 81 MG chewable tablet Chew 1 tablet (81 mg total) by mouth daily.  Marland Kitchen atorvastatin (LIPITOR) 40 MG tablet Take 1 tablet (40 mg total) by mouth daily at 6 PM.  . MIRTAZAPINE PO Take 1 1/2 (22.5MG ) tablet by mouth daily.  . Multiple Vitamins-Minerals (CENTRUM SILVER 50+MEN) TABS Take 1 tablet by mouth daily.   . nitroGLYCERIN (NITROSTAT) 0.4 MG SL tablet Place 1 tablet (0.4 mg total) under the tongue every 5 (five) minutes as needed for chest pain. Maximum of 3 doses.  . pantoprazole (PROTONIX) 40 MG tablet Take 40 mg by mouth daily.  Marland Kitchen  vitamin C (ASCORBIC ACID) 500 MG tablet Take 500 mg by mouth daily.  Marland Kitchen VITAMIN D PO Take by mouth daily.    Allergies: Sulfa antibiotics  Social History   Tobacco Use  . Smoking status: Never Smoker  . Smokeless tobacco: Never Used  Vaping Use  . Vaping Use: Never used  Substance Use Topics  . Alcohol use: No  . Drug use: No    Family History    Problem Relation Age of Onset  . Cancer Mother        a. cardiac tumor    Review of Systems: A 12-system review of systems was performed and was negative except as noted in the HPI.  --------------------------------------------------------------------------------------------------  Physical Exam: BP 140/70 (BP Location: Left Arm, Patient Position: Sitting, Cuff Size: Normal)   Pulse 75   Ht 6' (1.829 m)   Wt 204 lb (92.5 kg)   SpO2 94%   BMI 27.67 kg/m   General: NAD. Accompanied by his wife. HEENT: No conjunctival pallor or scleral icterus. Facemask in place. Neck: No JVD or HJR. Lungs: Normal work of breathing. Clear to auscultation bilaterally without wheezes or crackles. Heart: Regular rate and rhythm without murmurs, rubs, or gallops. Abd: Bowel sounds present. Soft, NT/ND. Ext: Trace to 1+ pretibial edema bilaterally.  EKG: Normal sinus rhythm with borderline LVH. No significant change from prior tracing on 10/07/2019.  Lab Results  Component Value Date   WBC 7.7 09/20/2018   HGB 15.1 09/20/2018   HCT 46.6 09/20/2018   MCV 92.5 09/20/2018   PLT 318 09/20/2018    Lab Results  Component Value Date   NA 141 09/20/2018   K 4.3 09/20/2018   CL 105 09/20/2018   CO2 21 (L) 09/20/2018   BUN 39 (H) 09/20/2018   CREATININE 1.65 (H) 09/20/2018   GLUCOSE 88 09/20/2018   ALT 14 09/20/2018    Lab Results  Component Value Date   CHOL 163 12/23/2017   HDL 32 (L) 12/23/2017   LDLCALC 95 12/23/2017   TRIG 179 (H) 12/23/2017   CHOLHDL 5.1 12/23/2017    --------------------------------------------------------------------------------------------------  ASSESSMENT AND PLAN: Coronary artery disease: No angina reported. Continue current medications for secondary prevention.  Chronic HFrEF and ischemic cardiomyopathy: Other than chronic trace ankle edema, Jesus Bolton appears euvolemic with stable NYHA class I-II symptoms. I have recommended repeating an echocardiogram to  reassess LVEF, given that it was 35 to 40% on last check in 04/2018. If this is stable/improved, I think it is reasonable to defer addition of goal-directed medical therapy. If EF has dropped, we will need to consider rechallenging Jesus Bolton with a beta-blocker and ACE inhibitor/ARB, though he has been intolerant in the past. He would also need to undergo repeat ischemia evaluation.  Hypertension: Blood pressure borderline elevated today. Given history of dizziness in the past, we will defer additional medication changes, continuing amlodipine 5 mg daily.  Hyperlipidemia: LDL well controlled on last check through PCP in February. Continue atorvastatin 40 mg daily.  COVID-19 vaccination: I spoke at length with Jesus Bolton and his wife regarding risks and benefits of COVID-19 vaccination. I have encouraged him to proceed with this at his earliest convenience.  Follow-up: Return to clinic in 6 months.  Yvonne Kendall, MD 04/11/2020 11:18 AM

## 2020-04-11 NOTE — Patient Instructions (Signed)
Medication Instructions:  Your physician recommends that you continue on your current medications as directed. Please refer to the Current Medication list given to you today.  *If you need a refill on your cardiac medications before your next appointment, please call your pharmacy*   Lab Work: none If you have labs (blood work) drawn today and your tests are completely normal, you will receive your results only by: Marland Kitchen MyChart Message (if you have MyChart) OR . A paper copy in the mail If you have any lab test that is abnormal or we need to change your treatment, we will call you to review the results.   Testing/Procedures: Your physician has requested that you have an echocardiogram. Echocardiography is a painless test that uses sound waves to create images of your heart. It provides your doctor with information about the size and shape of your heart and how well your heart's chambers and valves are working. This procedure takes approximately one hour. There are no restrictions for this procedure. You may get an IV, if needed, to receive an ultrasound enhancing agent through to better visualize your heart.    Follow-Up: At [facility], you and your health needs are our priority.  As part of our continuing mission to provide you with exceptional heart care, we have created designated Provider Care Teams.  These Care Teams include your primary Cardiologist (physician) and Advanced Practice Providers (APPs -  Physician Assistants and Nurse Practitioners) who all work together to provide you with the care you need, when you need it.  We recommend signing up for the patient portal called "MyChart".  Sign up information is provided on this After Visit Summary.  MyChart is used to connect with patients for Virtual Visits (Telemedicine).  Patients are able to view lab/test results, encounter notes, upcoming appointments, etc.  Non-urgent messages can be sent to your provider as well.   To learn more  about what you can do with MyChart, go to [email or web address].    Your next appointment:   6 month(s)  The format for your next appointment:   In Person  Provider:   You may see Yvonne Kendall, MD or one of the following Advanced Practice Providers on your designated Care Team:    Nicolasa Ducking, NP  Eula Listen, PA-C  Marisue Ivan, PA-C  Cadence Fransico Michael, [place]    Echocardiogram An echocardiogram is a procedure that uses painless sound waves (ultrasound) to produce an image of the heart. Images from an echocardiogram can provide important information about:  Signs of coronary artery disease (CAD).  Aneurysm detection. An aneurysm is a weak or damaged part of an artery wall that bulges out from the normal force of blood pumping through the body.  Heart size and shape. Changes in the size or shape of the heart can be associated with certain conditions, including heart failure, aneurysm, and CAD.  Heart muscle function.  Heart valve function.  Signs of a past heart attack.  Fluid buildup around the heart.  Thickening of the heart muscle.  A tumor or infectious growth around the heart valves. Tell a health care provider about:  Any allergies you have.  All medicines you are taking, including vitamins, herbs, eye drops, creams, and over-the-counter medicines.  Any blood disorders you have.  Any surgeries you have had.  Any medical conditions you have.  Whether you are pregnant or may be pregnant. What are the risks? Generally, this is a safe procedure. However, problems may occur, including:  Allergic  reaction to dye (contrast) that may be used during the procedure. What happens before the procedure? No specific preparation is needed. You may eat and drink normally. What happens during the procedure?   An IV tube may be inserted into one of your veins.  You may receive contrast through this tube. A contrast is an injection that improves the quality  of the pictures from your heart.  A gel will be applied to your chest.  A wand-like tool (transducer) will be moved over your chest. The gel will help to transmit the sound waves from the transducer.  The sound waves will harmlessly bounce off of your heart to allow the heart images to be captured in real-time motion. The images will be recorded on a computer. The procedure may vary among health care providers and hospitals. What happens after the procedure?  You may return to your normal, everyday life, including diet, activities, and medicines, unless your health care provider tells you not to do that. Summary  An echocardiogram is a procedure that uses painless sound waves (ultrasound) to produce an image of the heart.  Images from an echocardiogram can provide important information about the size and shape of your heart, heart muscle function, heart valve function, and fluid buildup around your heart.  You do not need to do anything to prepare before this procedure. You may eat and drink normally.  After the echocardiogram is completed, you may return to your normal, everyday life, unless your health care provider tells you not to do that. This information is not intended to replace advice given to you by your health care provider. Make sure you discuss any questions you have with your health care provider. Document Revised: 09/23/2018 Document Reviewed: 07/05/2016 Elsevier Patient Education  2020 [employer].

## 2020-04-18 ENCOUNTER — Other Ambulatory Visit: Payer: Self-pay | Admitting: Internal Medicine

## 2020-05-17 ENCOUNTER — Ambulatory Visit (INDEPENDENT_AMBULATORY_CARE_PROVIDER_SITE_OTHER): Payer: Medicare PPO

## 2020-05-17 ENCOUNTER — Other Ambulatory Visit: Payer: Self-pay

## 2020-05-17 DIAGNOSIS — I255 Ischemic cardiomyopathy: Secondary | ICD-10-CM

## 2020-05-17 LAB — ECHOCARDIOGRAM COMPLETE
AR max vel: 3.07 cm2
AV Area VTI: 2.97 cm2
AV Area mean vel: 2.86 cm2
AV Mean grad: 3 mmHg
AV Peak grad: 4.8 mmHg
Ao pk vel: 1.09 m/s
Area-P 1/2: 5.09 cm2
S' Lateral: 4.5 cm
Single Plane A4C EF: 46.3 %

## 2020-05-17 MED ORDER — PERFLUTREN LIPID MICROSPHERE
1.0000 mL | INTRAVENOUS | Status: AC | PRN
  Administered 2020-05-17: 2 mL via INTRAVENOUS

## 2020-05-18 ENCOUNTER — Telehealth: Payer: Self-pay | Admitting: *Deleted

## 2020-05-18 DIAGNOSIS — I5022 Chronic systolic (congestive) heart failure: Secondary | ICD-10-CM

## 2020-05-18 DIAGNOSIS — I10 Essential (primary) hypertension: Secondary | ICD-10-CM

## 2020-05-18 MED ORDER — LOSARTAN POTASSIUM 25 MG PO TABS: 12.5000 mg | ORAL_TABLET | Freq: Every day | ORAL | 3 refills | Status: DC

## 2020-05-18 NOTE — Telephone Encounter (Signed)
-----   Message from Yvonne Kendall, MD sent at 05/18/2020  7:47 AM EST ----- Please let Jesus Bolton know that his echocardiogram shows relatively stable reduction in his heart pumping function.  I suggest that we rechallenge him with low doses of evidence-based heart failure therapy in an attempt to improve his heart pumping function.  If he is agreeable, I recommend starting losartan 12.5 mg daily with basic metabolic panel in about 2 weeks.

## 2020-05-18 NOTE — Telephone Encounter (Signed)
Results released to My Chart. No answer. Left message to call back.   

## 2020-05-18 NOTE — Telephone Encounter (Signed)
Results called to pt. Pt verbalized understanding. Patient says he cannot take lisinopril.  It says it makes him "crazy." He would get confused in his mind. States he's taken metoprolol in the past and is willing to try losartan.  He thinks he's taken losartan in the past but thought it messed with his kidneys. I assured him that was why Dr End wanted to get lab work in 2 weeks. He is agreeable. Rx sent to pharmacy and he is aware to [facility] in 2 weeks.

## 2020-05-18 NOTE — Telephone Encounter (Signed)
Patient returning call.

## 2020-05-19 IMAGING — CR DG CHEST 2V
1 series · 2 of 2 positions shown · non-contrast
Comparison: None.

CLINICAL DATA: Upper chest pain

EXAM:
CHEST - 2 VIEW

[Series 1: dg chest 2 view · 0.14mm/px · 2 of 2 slices shown]
[im 1/2]
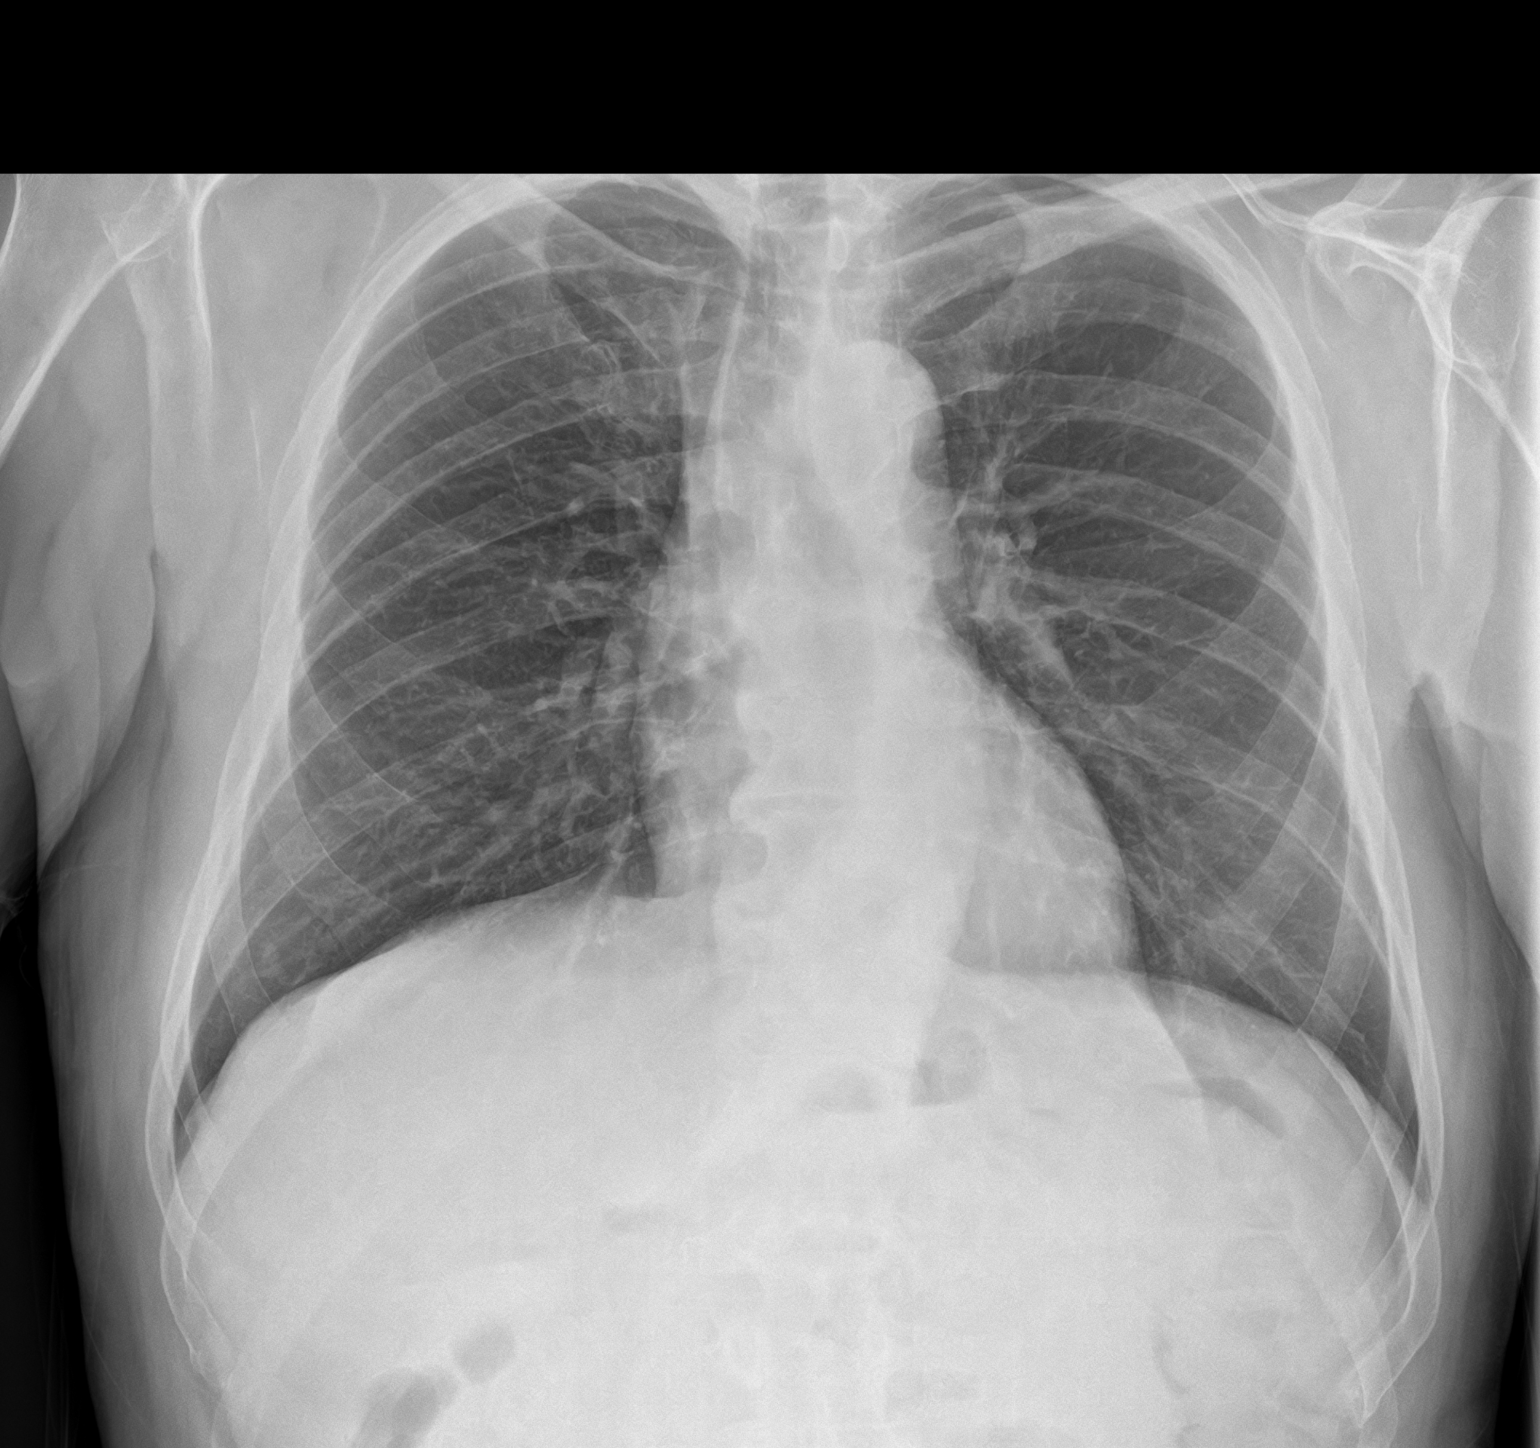
[im 2/2]
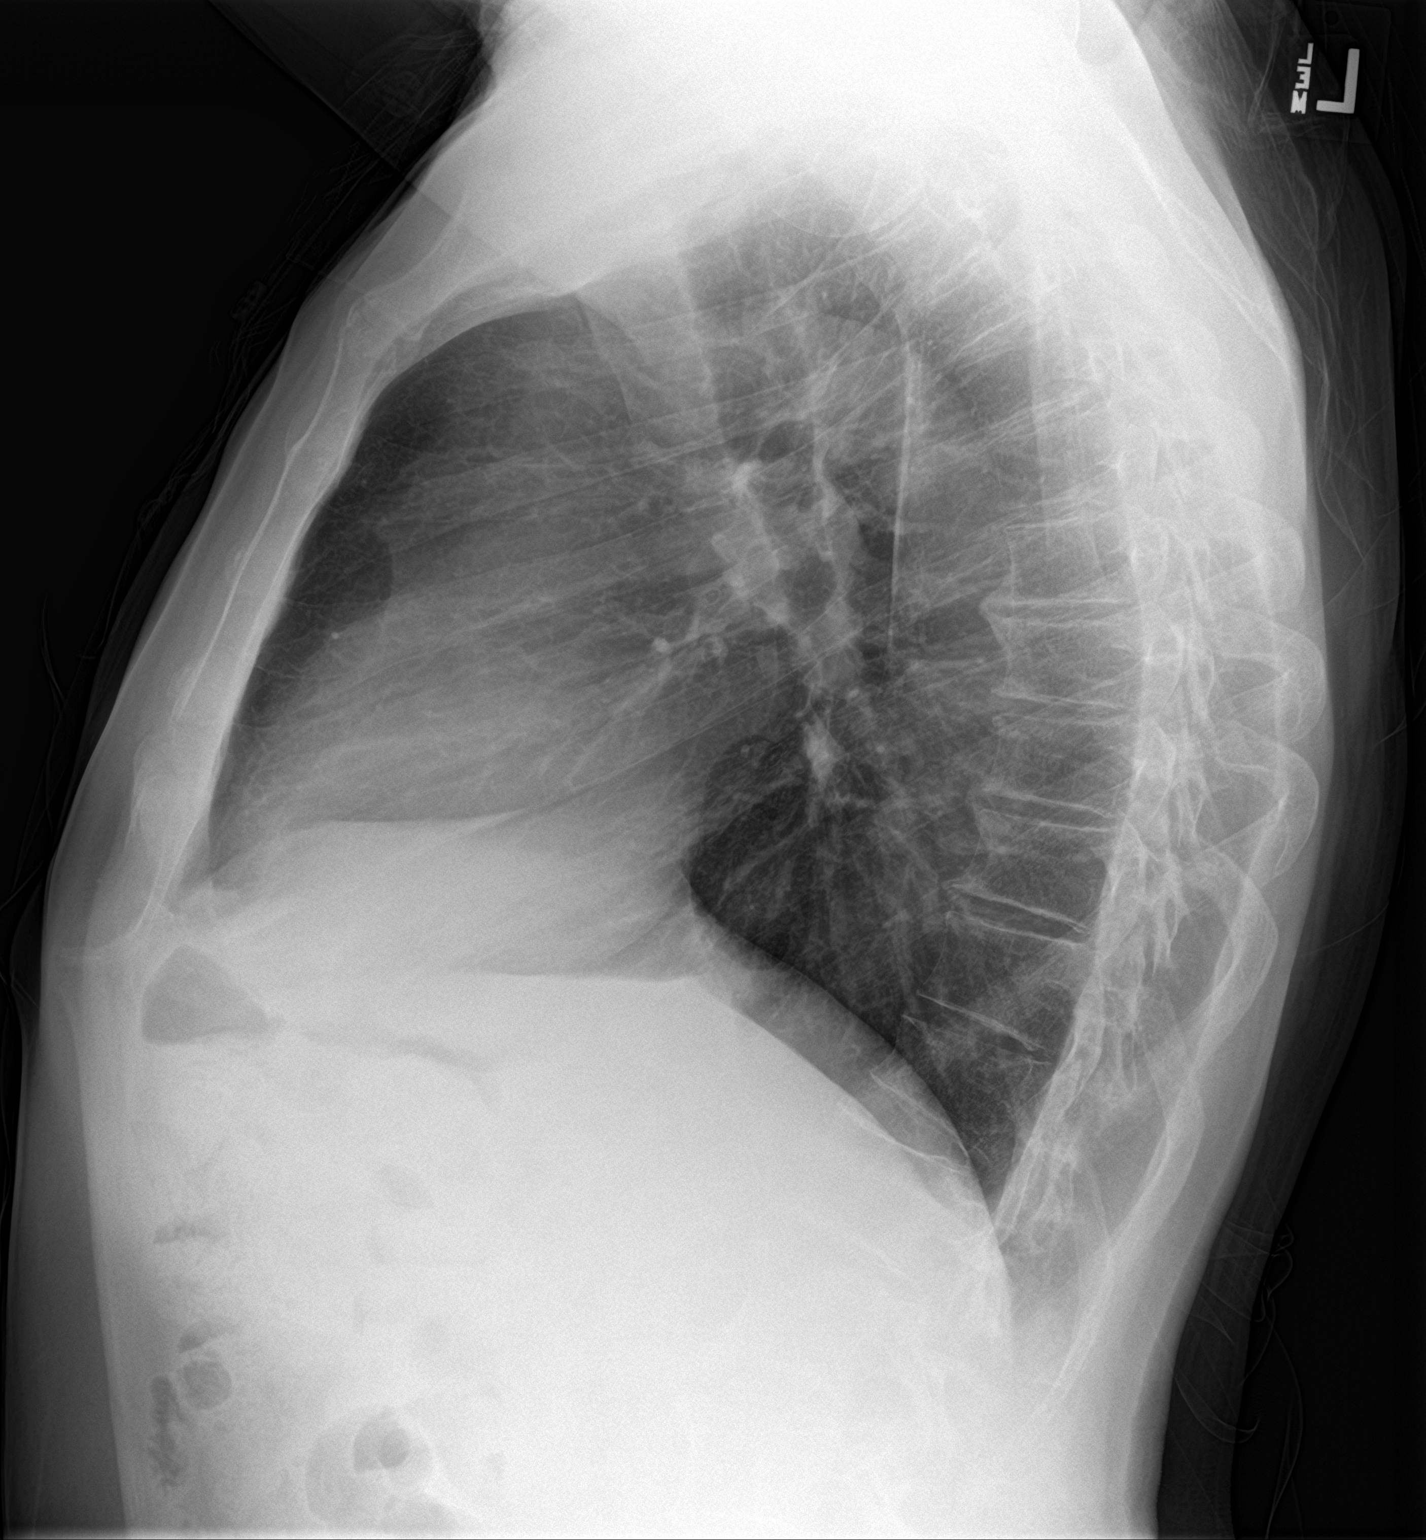

[2 of 2 positions shown; findings below may reference images not displayed]

FINDINGS: Normal mediastinum and cardiac silhouette. Normal pulmonary
vasculature. No evidence of effusion, infiltrate, or pneumothorax.
No acute bony abnormality. Degenerative osteophytosis of the spine.
IMPRESSION: No acute cardiopulmonary process.

## 2020-06-01 ENCOUNTER — Other Ambulatory Visit
Admission: RE | Admit: 2020-06-01 | Discharge: 2020-06-01 | Disposition: A | Discharge status: Home / Self Care | Payer: Medicare PPO | Source: Ambulatory Visit | Attending: Internal Medicine | Admitting: Internal Medicine

## 2020-06-01 DIAGNOSIS — I5022 Chronic systolic (congestive) heart failure: Secondary | ICD-10-CM | POA: Insufficient documentation

## 2020-06-01 DIAGNOSIS — I10 Essential (primary) hypertension: Secondary | ICD-10-CM | POA: Insufficient documentation

## 2020-06-01 LAB — BASIC METABOLIC PANEL
Anion gap: 10 (ref 5–15)
BUN: 22 mg/dL (ref 8–23)
CO2: 25 mmol/L (ref 22–32)
Calcium: 9.5 mg/dL (ref 8.9–10.3)
Chloride: 104 mmol/L (ref 98–111)
Creatinine, Ser: 1.51 mg/dL — ABNORMAL HIGH (ref 0.61–1.24)
GFR, Estimated: 48 mL/min — ABNORMAL LOW (ref >60)
Glucose, Bld: 96 mg/dL (ref 70–99)
Potassium: 4.4 mmol/L (ref 3.5–5.1)
Sodium: 139 mmol/L (ref 135–145)

## 2020-09-06 ENCOUNTER — Other Ambulatory Visit: Payer: Self-pay | Admitting: Internal Medicine

## 2020-10-10 ENCOUNTER — Ambulatory Visit: Payer: Medicare PPO | Admitting: Internal Medicine

## 2020-10-10 ENCOUNTER — Encounter: Payer: Self-pay | Admitting: Internal Medicine

## 2020-10-10 ENCOUNTER — Other Ambulatory Visit: Payer: Self-pay

## 2020-10-10 VITALS — BP 144/84 | HR 70 | Ht 72.0 in | Wt 216.0 lb

## 2020-10-10 DIAGNOSIS — I5022 Chronic systolic (congestive) heart failure: Secondary | ICD-10-CM | POA: Diagnosis not present

## 2020-10-10 DIAGNOSIS — I2510 Atherosclerotic heart disease of native coronary artery without angina pectoris: Secondary | ICD-10-CM

## 2020-10-10 DIAGNOSIS — I10 Essential (primary) hypertension: Secondary | ICD-10-CM

## 2020-10-10 DIAGNOSIS — E785 Hyperlipidemia, unspecified: Secondary | ICD-10-CM

## 2020-10-10 DIAGNOSIS — I428 Other cardiomyopathies: Secondary | ICD-10-CM | POA: Diagnosis not present

## 2020-10-10 DIAGNOSIS — I255 Ischemic cardiomyopathy: Secondary | ICD-10-CM

## 2020-10-10 DIAGNOSIS — Z79899 Other long term (current) drug therapy: Secondary | ICD-10-CM

## 2020-10-10 MED ORDER — AMLODIPINE BESYLATE 5 MG PO TABS: 5.0000 mg | ORAL_TABLET | Freq: Every day | ORAL | 2 refills | Status: DC

## 2020-10-10 MED ORDER — LOSARTAN POTASSIUM 25 MG PO TABS: 25.0000 mg | ORAL_TABLET | Freq: Every day | ORAL | 2 refills | Status: DC

## 2020-10-10 MED ORDER — NITROGLYCERIN 0.4 MG SL SUBL: 0.4000 mg | SUBLINGUAL_TABLET | SUBLINGUAL | 3 refills | Status: DC | PRN

## 2020-10-10 NOTE — Patient Instructions (Signed)
Medication Instructions:  - Your physician has recommended you make the following change in your medication:   1) INCREASE losartan to 25 mg- take 1 tablet by mouth once daily  *If you need a refill on your cardiac medications before your next appointment, please call your pharmacy*   Lab Work: - Your physician recommends that you return for lab work in: 2 weeks (around 10/24/20)- BMP  [facility] Entrance at [facility] 1st desk on the right to check in, past the screening table Lab hours: Monday- Friday (7:30 am- 5:30 pm) No appointment needed  If you have labs (blood work) drawn today and your tests are completely normal, you will receive your results only by: Marland Kitchen MyChart Message (if you have MyChart) OR . A paper copy in the mail If you have any lab test that is abnormal or we need to change your treatment, we will call you to review the results.   Testing/Procedures: - none ordered   Follow-Up: At [facility], you and your health needs are our priority.  As part of our continuing mission to provide you with exceptional heart care, we have created designated Provider Care Teams.  These Care Teams include your primary Cardiologist (physician) and Advanced Practice Providers (APPs -  Physician Assistants and Nurse Practitioners) who all work together to provide you with the care you need, when you need it.  We recommend signing up for the patient portal called "MyChart".  Sign up information is provided on this After Visit Summary.  MyChart is used to connect with patients for Virtual Visits (Telemedicine).  Patients are able to view lab/test results, encounter notes, upcoming appointments, etc.  Non-urgent messages can be sent to your provider as well.   To learn more about what you can do with MyChart, go to [email or web address].    Your next appointment:   6 month(s)  The format for your next appointment:   In Person  Provider:   You may see Yvonne Kendall, MD or one of the  following Advanced Practice Providers on your designated Care Team:    Nicolasa Ducking, NP  Eula Listen, PA-C  Marisue Ivan, PA-C  Cadence [place], [place]  Gillian Shields, NP    Other Instructions n/a

## 2020-10-10 NOTE — Progress Notes (Signed)
Follow-up Outpatient Visit Date: 10/10/2020  Primary Care Provider: Debbra Riding [facility], PA-C [address] [place] [address]  Chief Complaint: Follow-up coronary artery disease and cardiomyopathy  HPI:  Mr. Zaremba is a 75 y.o. 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  I last saw Mr. Raburn in 03/2020, at which time he was doing relatively well.  He noted some chest wall pain near his sternoclavicular joints, with prior x-rays having been unrevealing.  Pain was typically present with coughing, sneezing, and bending.  He denied exertional chest pain.  We agreed to repeat an echo to assess for improvement in his LVEF (previously 35-40% in 2019) to determine if we needed to be more aggressive with GDMT.  This showed stable LVEF of 35-40%, prompting [place] to add losartan 12.5 mg daily.  Today, Mr. Nelles reports that he is doing quite well.  He is tolerating losartan well.  His blood pressure has been normal.  He notes mild swelling in his ankles which has been chronic and stable.  He denies chest pain, shortness of breath, palpitations, and lightheadedness.  He is trying to walk daily.  He notes that his weight has been trending up, which he attributes to increased appetite related to mirtazapine.  --------------------------------------------------------------------------------------------------  Past Medical History:  Diagnosis Date  . BPH (benign prostatic hyperplasia)   . CAD (coronary artery disease)    a. 12/2017 NSTEMI/PCI: LM nl, LAD 40-50p, 20-13m, LCX nl, RCA 20-30ost/p, 32m (3.25x12 Xience Sierra DES - post-dil to 3.22mm), 20d.  . Chronic combined systolic (congestive) and diastolic (congestive) heart failure (HCC)   . CKD  (chronic kidney disease), stage III (HCC)   . Depression   . Elevated PSA   . Essential hypertension   . Hyperlipidemia LDL goal <70   . Indigestion   . Ischemic cardiomyopathy    a. 12/2017 Echo: EF 40-45%, diff HK, Gr1 DD, mild LVH. Mild AI. Mildly dil LA. Nl RV size/fxn. PASP 40-68mmHg; b. 04/2018 Echo: EF 35-40%, diff HK, Gr1 DD, mild AI, mild MR, mildly dil LA, Nl RV fxn.  . Thyroid disease    Past Surgical History:  Procedure Laterality Date  . CARDIAC CATHETERIZATION    . GREEN LIGHT LASER TURP (TRANSURETHRAL RESECTION OF PROSTATE N/A 01/02/2015   Procedure: GREEN LIGHT LASER TURP (TRANSURETHRAL RESECTION OF PROSTATE;  Surgeon: Orson Ape, MD;  Location: [facility] ORS;  Service: Urology;  Laterality: N/A;  . LEFT HEART CATH AND CORONARY ANGIOGRAPHY N/A 12/23/2017   Procedure: LEFT HEART CATH AND CORONARY ANGIOGRAPHY;  Surgeon: Yvonne Kendall, MD;  Location: [facility] INVASIVE CV LAB;  Service: Cardiovascular;  Laterality: N/A;    Current Meds  Medication Sig  . allopurinol (ZYLOPRIM) 100 MG tablet Take by mouth daily.  Marland Kitchen aspirin 81 MG chewable tablet Chew 1 tablet (81 mg total) by mouth daily.  Marland Kitchen atorvastatin (LIPITOR) 40 MG tablet Take 1 tablet (40 mg total) by mouth daily at 6 PM.  . mirtazapine (REMERON) 15 MG tablet Take 7.5 mg by mouth at bedtime.  . Multiple Vitamins-Minerals (CENTRUM SILVER 50+MEN) TABS Take 1 tablet by mouth daily.   . pantoprazole (PROTONIX) 40 MG tablet Take 40 mg by mouth every other day.  . vitamin C (ASCORBIC ACID) 500 MG tablet Take 500 mg by mouth daily.  Marland Kitchen  VITAMIN D PO Take by mouth daily.  . [DISCONTINUED] amLODipine (NORVASC) 5 MG tablet Take 1 tablet (5 mg total) by mouth daily.  . [DISCONTINUED] losartan (COZAAR) 25 MG tablet Take 0.5 tablets (12.5 mg total) by mouth daily.  . [DISCONTINUED] nitroGLYCERIN (NITROSTAT) 0.4 MG SL tablet Place 1 tablet (0.4 mg total) under the tongue every 5 (five) minutes as needed for chest pain. Maximum of 3 doses.     Allergies: Sulfa antibiotics  Social History   Tobacco Use  . Smoking status: Never Smoker  . Smokeless tobacco: Never Used  Vaping Use  . Vaping Use: Never used  Substance Use Topics  . Alcohol use: No  . Drug use: No    Family History  Problem Relation Age of Onset  . Cancer Mother        a. cardiac tumor    Review of Systems: A 12-system review of systems was performed and was negative except as noted in the HPI.  --------------------------------------------------------------------------------------------------  Physical Exam: BP (!) 144/84 (BP Location: Left Arm, Patient Position: Sitting, Cuff Size: Large)   Pulse 70   Ht 6' (1.829 m)   Wt 216 lb (98 kg)   SpO2 96%   BMI 29.29 kg/m   General:  NAD.  He is accompanied by his wife. Neck: No JVD or HJR. Lungs: Clear to auscultation bilaterally without wheezes or crackles. Heart: Regular rate and rhythm without murmurs, rubs, or gallops. Abdomen: Soft, nontender, nondistended. Extremities: race pretibial edema noted.  2+ pedal pulses.  EKG: Normal sinus rhythm with borderline LVH.  Lab Results  Component Value Date   WBC 7.7 09/20/2018   HGB 15.1 09/20/2018   HCT 46.6 09/20/2018   MCV 92.5 09/20/2018   PLT 318 09/20/2018    Lab Results  Component Value Date   NA 139 06/01/2020   K 4.4 06/01/2020   CL 104 06/01/2020   CO2 25 06/01/2020   BUN 22 06/01/2020   CREATININE 1.51 (H) 06/01/2020   GLUCOSE 96 06/01/2020   ALT 14 09/20/2018    Lab Results  Component Value Date   CHOL 163 12/23/2017   HDL 32 (L) 12/23/2017   LDLCALC 95 12/23/2017   TRIG 179 (H) 12/23/2017   CHOLHDL 5.1 12/23/2017    --------------------------------------------------------------------------------------------------  ASSESSMENT AND PLAN: Coronary artery disease: Mr. Micco is doing well without angina.  We will continue his current medications for secondary prevention including aspirin and  atorvastatin.  Hypertension: This has been labile in the past but overall seems to be under better control recently.  Blood pressure is mildly elevated today.  We have agreed to increase losartan to 25 mg daily.  We will check a BMP in about 2 weeks.  Chronic HFrEF due to mixed ischemic and nonischemic cardiomyopathy: Other than chronic trace pretibial edema, Mr. Naill appears euvolemic with NYHA class II symptoms.  I encouraged him to continue to increase his activity as tolerated.  As above, will increase losartan to 25 mg daily in an effort to optimize his GDMT.  We will defer rechallenge in him with a beta-blocker at this time given associated fatigue and orthostatic lightheadedness in the past.  However, we will need to readdress this in the future.  No need for standing diuretic therapy.  Sodium restriction was encouraged.  Hyperlipidemia: Most recent lipid panel through [facility] clinic in 07/2020 showed excellent LDL of 31.  Continue atorvastatin 40 mg daily.  Follow-up: Return to clinic in 6 months.  Yvonne Kendall, MD 10/11/2020  9:16 PM

## 2020-10-11 ENCOUNTER — Encounter: Payer: Self-pay | Admitting: Internal Medicine

## 2020-10-30 ENCOUNTER — Other Ambulatory Visit
Admission: RE | Admit: 2020-10-30 | Discharge: 2020-10-30 | Disposition: A | Discharge status: Home / Self Care | Payer: Medicare PPO | Attending: Internal Medicine | Admitting: Internal Medicine

## 2020-10-30 DIAGNOSIS — I10 Essential (primary) hypertension: Secondary | ICD-10-CM | POA: Diagnosis present

## 2020-10-30 DIAGNOSIS — Z79899 Other long term (current) drug therapy: Secondary | ICD-10-CM | POA: Diagnosis present

## 2020-10-30 DIAGNOSIS — I5022 Chronic systolic (congestive) heart failure: Secondary | ICD-10-CM | POA: Diagnosis present

## 2020-10-30 LAB — BASIC METABOLIC PANEL
Anion gap: 8 (ref 5–15)
BUN: 24 mg/dL — ABNORMAL HIGH (ref 8–23)
CO2: 25 mmol/L (ref 22–32)
Calcium: 9.6 mg/dL (ref 8.9–10.3)
Chloride: 104 mmol/L (ref 98–111)
Creatinine, Ser: 1.5 mg/dL — ABNORMAL HIGH (ref 0.61–1.24)
GFR, Estimated: 48 mL/min — ABNORMAL LOW (ref >60)
Glucose, Bld: 102 mg/dL — ABNORMAL HIGH (ref 70–99)
Potassium: 4.9 mmol/L (ref 3.5–5.1)
Sodium: 137 mmol/L (ref 135–145)

## 2021-02-15 IMAGING — DX ABDOMEN - 1 VIEW
1 series · 1 of 1 positions shown · non-contrast
Comparison: CT 10/08/2017.

CLINICAL DATA: Constipation last week. Decreased oral intake,
difficulty swallowing and weight loss.

EXAM:
ABDOMEN - 1 VIEW

[abdomen supine]
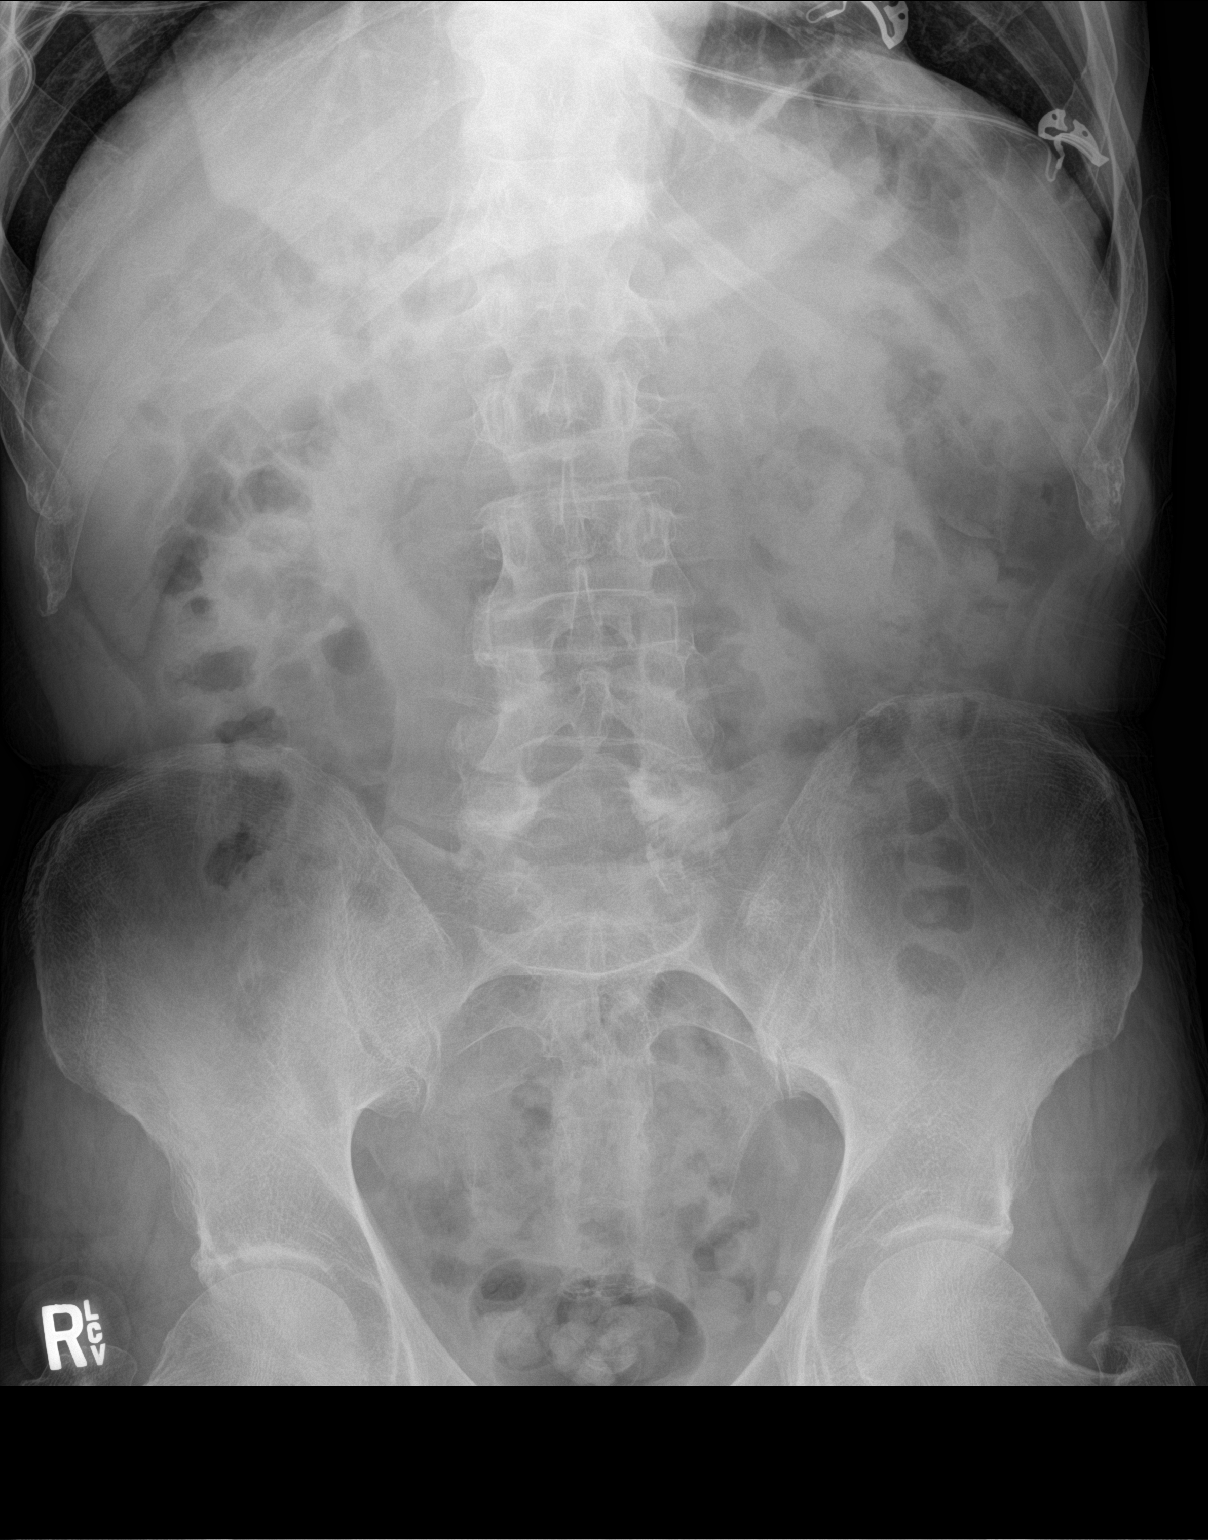

[1 of 1 positions shown; findings below may reference images not displayed]

FINDINGS: The bowel gas pattern is normal. There is no supine evidence of free
intraperitoneal air. There is a stable left pelvic phleboliths. No
suspicious abdominal calcifications or osseous findings.
IMPRESSION: No acute abdominal findings.  No significant constipation suggested.

## 2021-04-10 ENCOUNTER — Ambulatory Visit: Payer: Medicare PPO | Admitting: Internal Medicine

## 2021-04-10 ENCOUNTER — Other Ambulatory Visit: Payer: Self-pay

## 2021-04-10 ENCOUNTER — Encounter: Payer: Self-pay | Admitting: Internal Medicine

## 2021-04-10 VITALS — BP 144/80 | HR 66 | Ht 72.0 in | Wt 210.0 lb

## 2021-04-10 DIAGNOSIS — E785 Hyperlipidemia, unspecified: Secondary | ICD-10-CM

## 2021-04-10 DIAGNOSIS — I5022 Chronic systolic (congestive) heart failure: Secondary | ICD-10-CM | POA: Diagnosis not present

## 2021-04-10 DIAGNOSIS — I10 Essential (primary) hypertension: Secondary | ICD-10-CM

## 2021-04-10 DIAGNOSIS — I2510 Atherosclerotic heart disease of native coronary artery without angina pectoris: Secondary | ICD-10-CM | POA: Diagnosis not present

## 2021-04-10 MED ORDER — LOSARTAN POTASSIUM 50 MG PO TABS: 50.0000 mg | ORAL_TABLET | Freq: Every day | ORAL | 3 refills | Status: DC

## 2021-04-10 NOTE — Patient Instructions (Signed)
Medication Instructions:   Your physician has recommended you make the following change in your medication:   STOP Amlodipine  INCREASE Losartan 50 mg daily   *If you need a refill on your cardiac medications before your next appointment, please call your pharmacy*   Lab Work:  Your physician recommends that you return for lab work in: 2 WEEKS (BMET)   - You do NOT have to be fasting for this lab  Testing/Procedures:  None ordered   Follow-Up: At [facility], you and your health needs are our priority.  As part of our continuing mission to provide you with exceptional heart care, we have created designated Provider Care Teams.  These Care Teams include your primary Cardiologist (physician) and Advanced Practice Providers (APPs -  Physician Assistants and Nurse Practitioners) who all work together to provide you with the care you need, when you need it.  We recommend signing up for the patient portal called "MyChart".  Sign up information is provided on this After Visit Summary.  MyChart is used to connect with patients for Virtual Visits (Telemedicine).  Patients are able to view lab/test results, encounter notes, upcoming appointments, etc.  Non-urgent messages can be sent to your provider as well.   To learn more about what you can do with MyChart, go to [email or web address].    Your next appointment:   6 month(s)  The format for your next appointment:   In Person  Provider:   You may see Yvonne Kendall, MD or one of the following Advanced Practice Providers on your designated Care Team:   Nicolasa Ducking, NP Eula Listen, PA-C Marisue Ivan, PA-C Cadence Pesotum, [place]

## 2021-04-10 NOTE — Progress Notes (Signed)
Follow-up Outpatient Visit Date: 04/10/2021  Primary Care Provider: Debbra Bolton [facility], PA-C [address] [place] [address]  Chief Complaint: Follow-up CAD and HFrEF  HPI:  Jesus Bolton is a 75 y.o. 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  I last saw him in April, at which time he was doing well other than mild ankle swelling that has been chronic.  We agreed to increase losartan to 25 mg daily in an effort to optimize his GDMT.  Today, Jesus Bolton reports he has been feeling quite well.  He notes occasional pain in his shoulders and under the left armpit that he attributes to moving furniture.  He does not have any chest or arm/shoulder pain when walking or otherwise exerting himself.  He denies shortness of breath, palpitations, and lightheadedness.  He has had mild leg edema and wonders if this may be related to amlodipine.  He is tolerating losartan well.  Home blood pressures are typically in the 130s systolic.  He is trying to work on further weight loss with a goal of 195 pounds.  --------------------------------------------------------------------------------------------------  Past Medical History:  Diagnosis Date   BPH (benign prostatic hyperplasia)    CAD (coronary artery disease)    a. 12/2017 NSTEMI/PCI: LM nl, LAD 40-50p, 20-58m, LCX nl, RCA 20-30ost/p, 58m (3.25x12 Xience Sierra DES - post-dil to 3.46mm), 20d.   Chronic combined systolic (congestive) and diastolic (congestive) heart failure (HCC)    CKD (chronic kidney disease), stage III (HCC)    Depression    Elevated PSA    Essential hypertension    Hyperlipidemia LDL goal <70    Indigestion    Ischemic cardiomyopathy    a. 12/2017 Echo:  EF 40-45%, diff HK, Gr1 DD, mild LVH. Mild AI. Mildly dil LA. Nl RV size/fxn. PASP 40-90mmHg; b. 04/2018 Echo: EF 35-40%, diff HK, Gr1 DD, mild AI, mild MR, mildly dil LA, Nl RV fxn.   Thyroid disease    Past Surgical History:  Procedure Laterality Date   CARDIAC CATHETERIZATION     GREEN LIGHT LASER TURP (TRANSURETHRAL RESECTION OF PROSTATE N/A 01/02/2015   Procedure: GREEN LIGHT LASER TURP (TRANSURETHRAL RESECTION OF PROSTATE;  Surgeon: Jesus Ape, MD;  Location: [facility] ORS;  Service: Urology;  Laterality: N/A;   LEFT HEART CATH AND CORONARY ANGIOGRAPHY N/A 12/23/2017   Procedure: LEFT HEART CATH AND CORONARY ANGIOGRAPHY;  Surgeon: Jesus Kendall, MD;  Location: [facility] INVASIVE CV LAB;  Service: Cardiovascular;  Laterality: N/A;    Current Meds  Medication Sig   amLODipine (NORVASC) 5 MG tablet Take 1 tablet (5 mg total) by mouth daily.   aspirin 81 MG chewable tablet Chew 1 tablet (81 mg total) by mouth daily.   atorvastatin (LIPITOR) 40 MG tablet Take 1 tablet (40 mg total) by mouth daily at 6 PM.   losartan (COZAAR) 25 MG tablet Take 1 tablet (25 mg total) by mouth daily.   Multiple Vitamins-Minerals (CENTRUM SILVER 50+MEN) TABS Take 1 tablet by mouth daily.    nitroGLYCERIN (NITROSTAT) 0.4 MG SL tablet Place 1 tablet (0.4 mg total) under the tongue every 5 (five) minutes as needed for chest pain. Maximum of 3 doses.   pantoprazole (PROTONIX) 40 MG tablet Take 40 mg by mouth every other day.    Allergies:  Sulfa antibiotics  Social History   Tobacco Use   Smoking status: Never   Smokeless tobacco: Never  Vaping Use   Vaping Use: Never used  Substance Use Topics   Alcohol use: No   Drug use: No    Family History  Problem Relation Age of Onset   Cancer Mother        a. cardiac tumor    Review of Systems: A 12-system review of systems was performed and was negative except as noted in the  HPI.  --------------------------------------------------------------------------------------------------  Physical Exam: BP (!) 144/80 (BP Location: Left Arm, Patient Position: Sitting, Cuff Size: Large)   Pulse 66   Ht 6' (1.829 m)   Wt 210 lb (95.3 kg)   SpO2 96%   BMI 28.48 kg/m   General:  NAD. Neck: No JVD or HJR. Lungs: Clear to auscultation bilaterally without wheezes or crackles. Heart: Regular rate and rhythm without murmurs, rubs, or gallops. Abdomen: Soft, nontender, nondistended. Extremities: 1+ pretibial edema bilaterally.  EKG: Normal sinus rhythm with borderline LVH.  No significant abnormality or change from prior tracing on 10/10/2020.  Lab Results  Component Value Date   WBC 7.7 09/20/2018   HGB 15.1 09/20/2018   HCT 46.6 09/20/2018   MCV 92.5 09/20/2018   PLT 318 09/20/2018    Lab Results  Component Value Date   NA 137 10/30/2020   K 4.9 10/30/2020   CL 104 10/30/2020   CO2 25 10/30/2020   BUN 24 (H) 10/30/2020   CREATININE 1.50 (H) 10/30/2020   GLUCOSE 102 (H) 10/30/2020   ALT 14 09/20/2018    Lab Results  Component Value Date   CHOL 163 12/23/2017   HDL 32 (L) 12/23/2017   LDLCALC 95 12/23/2017   TRIG 179 (H) 12/23/2017   CHOLHDL 5.1 12/23/2017    --------------------------------------------------------------------------------------------------  ASSESSMENT AND PLAN: Coronary artery disease: No angina reported.  We discussed potential for shoulder pain representing an anginal equivalent, though it is notably different than what Jesus Bolton experienced at the time of his MI.  Given that he does not have any exertional chest pain, we have agreed to defer further work-up.  If he were to have any new symptoms including chest pain, shortness of breath, or fatigue, we would need to consider noninvasive ischemia evaluation.  Chronic HFrEF: Jesus Bolton has mild leg edema which has been chronic.  He otherwise does not have any heart failure symptoms  (NYHA class I-II).  We have agreed to discontinue amlodipine and increase losartan to 50 mg daily.  Defer Jesus Bolton challenging with a beta-blocker as this caused significant fatigue and lightheadedness in the past.  Hypertension: Blood pressure mildly elevated today but typically a little better at home.  Given edema, we have agreed to discontinue amlodipine and escalate losartan, as above.  We will repeat a BMP in 2 weeks.  Hyperlipidemia: Most recent lipid panel in 07/2020 through [facility] clinic showed excellent LDL controlled and mildly elevated triglycerides.  We will continue atorvastatin 40 mg daily and work on lifestyle modifications.  Follow-up: Return to clinic in 6 months.  Jesus Kendall, MD 04/10/2021 11:39 AM

## 2021-04-24 ENCOUNTER — Other Ambulatory Visit: Payer: Self-pay

## 2021-04-24 ENCOUNTER — Other Ambulatory Visit (INDEPENDENT_AMBULATORY_CARE_PROVIDER_SITE_OTHER): Payer: Medicare PPO

## 2021-04-24 DIAGNOSIS — I10 Essential (primary) hypertension: Secondary | ICD-10-CM | POA: Diagnosis not present

## 2021-04-24 DIAGNOSIS — I2510 Atherosclerotic heart disease of native coronary artery without angina pectoris: Secondary | ICD-10-CM

## 2021-04-24 DIAGNOSIS — I5022 Chronic systolic (congestive) heart failure: Secondary | ICD-10-CM

## 2021-04-25 LAB — BASIC METABOLIC PANEL
BUN/Creatinine Ratio: 11 (ref 10–24)
BUN: 15 mg/dL (ref 8–27)
CO2: 21 mmol/L (ref 20–29)
Calcium: 9.3 mg/dL (ref 8.6–10.2)
Chloride: 106 mmol/L (ref 96–106)
Creatinine, Ser: 1.4 mg/dL — ABNORMAL HIGH (ref 0.76–1.27)
Glucose: 84 mg/dL (ref 70–99)
Potassium: 4.9 mmol/L (ref 3.5–5.2)
Sodium: 141 mmol/L (ref 134–144)
eGFR: 52 mL/min/{1.73_m2} — ABNORMAL LOW (ref >59)

## 2021-04-29 ENCOUNTER — Other Ambulatory Visit: Payer: Self-pay | Admitting: Internal Medicine

## 2021-10-10 ENCOUNTER — Encounter: Payer: Self-pay | Admitting: Internal Medicine

## 2021-10-10 ENCOUNTER — Ambulatory Visit: Payer: Medicare PPO | Admitting: Internal Medicine

## 2021-10-10 VITALS — BP 140/82 | HR 74 | Ht 71.0 in | Wt 215.0 lb

## 2021-10-10 DIAGNOSIS — I10 Essential (primary) hypertension: Secondary | ICD-10-CM | POA: Diagnosis not present

## 2021-10-10 DIAGNOSIS — E785 Hyperlipidemia, unspecified: Secondary | ICD-10-CM | POA: Diagnosis not present

## 2021-10-10 DIAGNOSIS — I5022 Chronic systolic (congestive) heart failure: Secondary | ICD-10-CM | POA: Diagnosis not present

## 2021-10-10 DIAGNOSIS — I2510 Atherosclerotic heart disease of native coronary artery without angina pectoris: Secondary | ICD-10-CM | POA: Diagnosis not present

## 2021-10-10 NOTE — Patient Instructions (Signed)
Medication Instructions:  Your physician recommends that you continue on your current medications as directed. Please refer to the Current Medication list given to you today.   *If you need a refill on your cardiac medications before your next appointment, please call your pharmacy*   Lab Work: None ordered  If you have labs (blood work) drawn today and your tests are completely normal, you will receive your results only by: MyChart Message (if you have MyChart) OR A paper copy in the mail If you have any lab test that is abnormal or we need to change your treatment, we will call you to review the results.   Testing/Procedures: None ordered  Follow-Up: At [facility], you and your health needs are our priority.  As part of our continuing mission to provide you with exceptional heart care, we have created designated Provider Care Teams.  These Care Teams include your primary Cardiologist (physician) and Advanced Practice Providers (APPs -  Physician Assistants and Nurse Practitioners) who all work together to provide you with the care you need, when you need it.  We recommend signing up for the patient portal called "MyChart".  Sign up information is provided on this After Visit Summary.  MyChart is used to connect with patients for Virtual Visits (Telemedicine).  Patients are able to view lab/test results, encounter notes, upcoming appointments, etc.  Non-urgent messages can be sent to your provider as well.   To learn more about what you can do with MyChart, go to [email or web address].    Your next appointment:   6 month(s)  The format for your next appointment:   In Person  Provider:   You may see Christopher End, MD or one of the following Advanced Practice Providers on your designated Care Team:   Christopher Berge, NP Ryan Dunn, PA-C Cadence Furth, PA-C{  Important Information About Sugar       

## 2021-10-10 NOTE — Progress Notes (Signed)
? ?Follow-up Outpatient Visit ?Date: 10/10/2021 ? ?Primary Care Provider: ?Wilford Corner, PA-C ?[address] ?[place] [address] ? ?Chief Complaint: Follow-up CAD and HFrEF ? ?HPI:  Jesus Bolton is a 76 y.o. 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  I last saw him in 03/2021, which time Mr. Hebda was feeling fairly well.  He complained of some pain in his shoulders and beneath the left axilla that he attributed to having moved heavy furniture.  Chronic mild leg edema was noted.  On account of this as well as mildly elevated blood pressure, we agreed to discontinue amlodipine and increase losartan to 50 mg daily. ? ?Today, Mr. Sherburn reports that he is feeling very well.  He notes that his leg swelling has improved after stopping amlodipine.  He is tolerating losartan well.  Home blood pressures are typically a little better than today's, most recently 127/70 and 131/67.  He denies chest pain, shortness of breath, and palpitations.  He has occasional transient orthostatic lightheadedness but has not fallen or passed out.  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 ? ?-------------------------------------------------------------------------------------------------- ? ?Past Medical History:  ?Diagnosis Date  ? BPH (benign prostatic hyperplasia)   ? CAD (coronary artery disease)   ? a. 12/2017 NSTEMI/PCI: LM nl, LAD 40-50p, 20-25m, LCX nl, RCA 20-30ost/p, 50m (3.25x12 Xience Sierra DES - post-dil to 3.43mm), 20d.  ? Chronic combined systolic (congestive) and diastolic (congestive) heart failure (HCC)   ? CKD (chronic kidney disease), stage III (HCC)   ? Depression    ? Elevated PSA   ? Essential hypertension   ? Hyperlipidemia LDL goal <70   ? Indigestion   ? Ischemic cardiomyopathy   ? a. 12/2017 Echo: EF 40-45%, diff HK, Gr1 DD, mild LVH. Mild AI. Mildly dil LA. Nl RV size/fxn. PASP 40-80mmHg; b. 04/2018 Echo: EF 35-40%, diff HK, Gr1 DD, mild AI, mild MR, mildly dil LA, Nl RV fxn.  ? Thyroid disease   ? ?Past Surgical History:  ?Procedure Laterality Date  ? CARDIAC CATHETERIZATION    ? GREEN LIGHT LASER TURP (TRANSURETHRAL RESECTION OF PROSTATE N/A 01/02/2015  ? Procedure: GREEN LIGHT LASER TURP (TRANSURETHRAL RESECTION OF PROSTATE;  Surgeon: Orson Ape, MD;  Location: [facility] ORS;  Service: Urology;  Laterality: N/A;  ? LEFT HEART CATH AND CORONARY ANGIOGRAPHY N/A 12/23/2017  ? Procedure: LEFT HEART CATH AND CORONARY ANGIOGRAPHY;  Surgeon: Yvonne Kendall, MD;  Location: [facility] INVASIVE CV LAB;  Service: Cardiovascular;  Laterality: N/A;  ? ? ?Current Meds  ?Medication Sig  ? aspirin 81 MG chewable tablet Chew 1 tablet (81 mg total) by mouth daily.  ? atorvastatin (LIPITOR) 40 MG tablet Take 1 tablet (40 mg total) by mouth daily at 6 PM.  ? losartan (COZAAR) 50 MG tablet Take 1 tablet (50 mg total) by mouth daily.  ? Multiple Vitamins-Minerals (CENTRUM SILVER 50+MEN) TABS Take 1 tablet by mouth daily.   ? nitroGLYCERIN (NITROSTAT) 0.4 MG SL tablet Place 1 tablet (0.4 mg total) under the tongue every 5 (five) minutes as needed for chest pain. Maximum of 3 doses.  ? ? ?Allergies: Sulfa antibiotics ? ?Social History  ? ?Tobacco Use  ? Smoking status: Never  ? Smokeless tobacco:  Never  ?Vaping Use  ? Vaping Use: Never used  ?Substance Use Topics  ? Alcohol use: No  ? Drug use: No  ? ? ?Family History  ?Problem Relation Age of Onset  ? Cancer Mother   ?     a. cardiac tumor  ? ? ?Review of Systems: ?A 12-system review of systems was performed and was negative except as noted in the  HPI. ? ?-------------------------------------------------------------------------------------------------- ? ?Physical Exam: ?BP 140/82 (BP Location: Left Arm, Patient Position: Sitting, Cuff Size: Normal)   Pulse 74   Ht 5\' 11"  (1.803 m)   Wt 215 lb (97.5 kg)   SpO2 99%   BMI 29.99 kg/m?  ? ?General:  NAD. ?Neck: No JVD or HJR. ?Lungs: Clear to auscultation bilaterally without wheezes or crackles. ?Heart: Regular rate and rhythm without murmurs, rubs, or gallops. ?Abdomen: Soft, nontender, nondistended. ?Extremities: No lower extremity edema. ? ?EKG: Normal sinus rhythm with borderline LVH.  No significant abnormality or change from prior tracing on 04/10/2021. ? ?Lab Results  ?Component Value Date  ? WBC 7.7 09/20/2018  ? HGB 15.1 09/20/2018  ? HCT 46.6 09/20/2018  ? MCV 92.5 09/20/2018  ? PLT 318 09/20/2018  ? ? ?Lab Results  ?Component Value Date  ? NA 141 04/24/2021  ? K 4.9 04/24/2021  ? CL 106 04/24/2021  ? CO2 21 04/24/2021  ? BUN 15 04/24/2021  ? CREATININE 1.40 (H) 04/24/2021  ? GLUCOSE 84 04/24/2021  ? ALT 14 09/20/2018  ? ? ?Lab Results  ?Component Value Date  ? CHOL 163 12/23/2017  ? HDL 32 (L) 12/23/2017  ? [place] 95 12/23/2017  ? TRIG 179 (H) 12/23/2017  ? CHOLHDL 5.1 12/23/2017  ? ? ?-------------------------------------------------------------------------------------------------- ? ?ASSESSMENT AND PLAN: ?Coronary artery disease: ?Mr. Linares continues to do well without recurrent angina following his NSTEMI and PCI to the mid RCA in 2019.  We will plan to continue secondary prevention with aspirin and atorvastatin.  LDL was excellent on last check in March with his PCP.  Triglycerides remain mildly elevated. ? ?Chronic HFrEF: ?Mr. Scurto continues to feel well with NYHA class I symptoms.  He appears euvolemic on examination today with improved leg swelling after discontinuation of amlodipine at our last visit.  We will continue losartan 50 mg daily.  He is not on a beta-blocker due to significant  lightheadedness and fatigue with this class in the past. ? ?Hypertension: ?Blood pressure borderline elevated today but typically better at home.  Continue losartan 50 mg daily. ? ?Hyperlipidemia: ?Continue atorvastatin 40 mg daily with ongoing lifestyle modifications to help improve triglycerides that were still mildly elevated on last check with PCP in March. ? ?Follow-up: Return to clinic in 6 months. ? ?Nelva Bush, MD ?10/10/2021 ?10:34 AM ? ?

## 2021-10-11 ENCOUNTER — Encounter: Payer: Self-pay | Admitting: Internal Medicine

## 2021-11-13 ENCOUNTER — Other Ambulatory Visit: Payer: Self-pay | Admitting: Internal Medicine

## 2022-02-24 ENCOUNTER — Other Ambulatory Visit: Payer: Self-pay | Admitting: Internal Medicine

## 2022-04-09 ENCOUNTER — Ambulatory Visit: Payer: Medicare PPO | Attending: Internal Medicine | Admitting: Internal Medicine

## 2022-04-09 ENCOUNTER — Encounter: Payer: Self-pay | Admitting: Internal Medicine

## 2022-04-09 VITALS — BP 136/84 | HR 67 | Ht 72.0 in | Wt 207.0 lb

## 2022-04-09 DIAGNOSIS — I5022 Chronic systolic (congestive) heart failure: Secondary | ICD-10-CM | POA: Diagnosis not present

## 2022-04-09 DIAGNOSIS — I2510 Atherosclerotic heart disease of native coronary artery without angina pectoris: Secondary | ICD-10-CM | POA: Diagnosis not present

## 2022-04-09 DIAGNOSIS — E782 Mixed hyperlipidemia: Secondary | ICD-10-CM

## 2022-04-09 DIAGNOSIS — I10 Essential (primary) hypertension: Secondary | ICD-10-CM | POA: Diagnosis not present

## 2022-04-09 MED ORDER — LOSARTAN POTASSIUM 50 MG PO TABS: 50.0000 mg | ORAL_TABLET | Freq: Every day | ORAL | 1 refills | Status: DC

## 2022-04-09 NOTE — Progress Notes (Signed)
Follow-up Outpatient Visit Date: 04/09/2022  Primary Care Provider: Grayland Ormond [facility], PA-C [address] [place] [address]  Chief Complaint: Follow-up coronary artery disease and HFrEF  HPI:  Jesus Bolton is a 76 y.o. 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  I last saw him in April, at which time he was feeling well.  His prior leg swelling resolved with discontinuation of amlodipine  Home BP's were well-controlled.  He reported occasional transient orthostatic lightheadedness without syncope or falls.  We did not make any medication changes or pursue additional testing.  Today, Jesus Bolton reports that he has been feeling well.  He denies chest pain, shortness of breath, and palpitations.  He has minimal dependent edema from time to time.  He continues to have some orthostatic lightheadedness when he gets up too quickly but has not passed out or fallen.  This has been stable since her last visit.  He is trying to walk or do other exercises at least 3 times a week.  He is tolerating his medications well and notes that his blood pressures are typically well controlled at home.  --------------------------------------------------------------------------------------------------  Past Medical History:  Diagnosis Date   BPH (benign prostatic hyperplasia)    CAD (coronary artery disease)    a. 12/2017 NSTEMI/PCI: LM nl, LAD 40-50p, 20-14m, LCX nl, RCA 20-30ost/p, 21m (3.25x12 Xience Sierra DES - post-dil to 3.56mm), 20d.   Chronic combined systolic (congestive) and diastolic (congestive) heart failure (HCC)    CKD (chronic kidney disease), stage III (HCC)    Depression    Elevated  PSA    Essential hypertension    Hyperlipidemia LDL goal <70    Indigestion    Ischemic cardiomyopathy    a. 12/2017 Echo: EF 40-45%, diff HK, Gr1 DD, mild LVH. Mild AI. Mildly dil LA. Nl RV size/fxn. PASP 40-58mmHg; b. 04/2018 Echo: EF 35-40%, diff HK, Gr1 DD, mild AI, mild MR, mildly dil LA, Nl RV fxn.   Thyroid disease    Past Surgical History:  Procedure Laterality Date   CARDIAC CATHETERIZATION     GREEN LIGHT LASER TURP (TRANSURETHRAL RESECTION OF PROSTATE N/A 01/02/2015   Procedure: GREEN LIGHT LASER TURP (TRANSURETHRAL RESECTION OF PROSTATE;  Surgeon: Jesus Cowper, MD;  Location: [facility] ORS;  Service: Urology;  Laterality: N/A;   LEFT HEART CATH AND CORONARY ANGIOGRAPHY N/A 12/23/2017   Procedure: LEFT HEART CATH AND CORONARY ANGIOGRAPHY;  Surgeon: Jesus Bush, MD;  Location: [place] CV LAB;  Service: Cardiovascular;  Laterality: N/A;    Current Meds  Medication Sig   aspirin 81 MG chewable tablet Chew 1 tablet (81 mg total) by mouth daily.   atorvastatin (LIPITOR) 40 MG tablet Take 1 tablet (40 mg total) by mouth daily at 6 PM.   Multiple Vitamins-Minerals (CENTRUM SILVER 50+MEN) TABS Take 1 tablet by mouth daily.    nitroGLYCERIN (NITROSTAT) 0.4 MG SL tablet Place 1 tablet (0.4 mg total) under the tongue every 5 (five) minutes as needed for chest pain. Maximum of 3 doses.   [DISCONTINUED] losartan (COZAAR) 50 MG tablet Take 1 tablet (50 mg total) by mouth daily.    Allergies: Sulfa antibiotics  Social History   Tobacco Use   Smoking status: Never   Smokeless tobacco: Never  Vaping Use   Vaping Use: Never used  Substance Use Topics   Alcohol use: No   Drug use: No    Family History  Problem Relation Age of Onset   Cancer Mother        a. cardiac tumor    Review of Systems: A 12-system review of systems was performed and was negative except as noted in the  HPI.  --------------------------------------------------------------------------------------------------  Physical Exam: BP 136/84 (BP Location: Left Arm, Patient Position: Sitting, Cuff Size: Large)   Pulse 67   Ht 6' (1.829 m)   Wt 207 lb (93.9 kg)   SpO2 98%   BMI 28.07 kg/m   General:  NAD. Neck: No JVD or HJR. Lungs: Clear to auscultation bilaterally without wheezes or crackles. Heart: Regular rate and rhythm without murmurs, rubs, or gallops. Abdomen: Soft, nontender, nondistended. Extremities: Trace pretibial edema bilaterally.  EKG: Normal sinus rhythm with borderline LVH.  No significant change from prior tracing on 10/10/2021.  Lab Results  Component Value Date   WBC 7.7 09/20/2018   HGB 15.1 09/20/2018   HCT 46.6 09/20/2018   MCV 92.5 09/20/2018   PLT 318 09/20/2018    Lab Results  Component Value Date   NA 141 04/24/2021   K 4.9 04/24/2021   CL 106 04/24/2021   CO2 21 04/24/2021   BUN 15 04/24/2021   CREATININE 1.40 (H) 04/24/2021   GLUCOSE 84 04/24/2021   ALT 14 09/20/2018    Lab Results  Component Value Date   CHOL 163 12/23/2017   HDL 32 (L) 12/23/2017   LDLCALC 95 12/23/2017   TRIG 179 (H) 12/23/2017   CHOLHDL 5.1 12/23/2017    --------------------------------------------------------------------------------------------------  ASSESSMENT AND PLAN: Coronary artery disease: No angina reported.  Continue secondary prevention with aspirin and atorvastatin.  Chronic HFrEF: Jesus Bolton continues to do well with minimal pretibial edema on exam and otherwise no other signs or symptoms of heart failure can send with NYHA class I heart failure.  Continue losartan 50 mg daily.  He has been intolerant of beta-blockers in the past due to lightheadedness and fatigue.  Hypertension: Blood pressure upper normal today but typically better at home.  We have agreed to continue losartan 50 mg daily.  Hyperlipidemia: Most recent lipid panel in March was notable  for total cholesterol 102, triglycerides 239, HDL 31, and LDL 23.  We will continue atorvastatin 40 mg daily.  We discussed addition of Vascepa to help improve hypertriglyceridemia and secondary prevention.  However, Jesus Bolton would like to try lifestyle modifications first.  He will have a repeat lipid panel done in about 6 months through his PCP.  If triglycerides remain above 150 at that time, addition of Vascepa will need to be readdressed.  Follow-up: Return to clinic in 9 months.  Jesus Bush, MD 04/09/2022 2:56 PM

## 2022-04-09 NOTE — Patient Instructions (Addendum)
Medication Instructions:  Your physician recommends that you continue on your current medications as directed. Please refer to the Current Medication list given to you today.  *If you need a refill on your cardiac medications before your next appointment, please call your pharmacy*   Lab Work: None needed  If you have labs (blood work) drawn today and your tests are completely normal, you will receive your results only by: [place] (if you have MyChart) OR A paper copy in the mail If you have any lab test that is abnormal or we need to change your treatment, we will call you to review the results.   Testing/Procedures: None needed  Follow-Up: At [facility], you and your health needs are our priority.  As part of our continuing mission to provide you with exceptional heart care, we have created designated Provider Care Teams.  These Care Teams include your primary Cardiologist (physician) and Advanced Practice Providers (APPs -  Physician Assistants and Nurse Practitioners) who all work together to provide you with the care you need, when you need it.  We recommend signing up for the patient portal called "MyChart".  Sign up information is provided on this After Visit Summary.  MyChart is used to connect with patients for Virtual Visits (Telemedicine).  Patients are able to view lab/test results, encounter notes, upcoming appointments, etc.  Non-urgent messages can be sent to your provider as well.   To learn more about what you can do with MyChart, go to [email or web address].    Your next appointment:   9 month(s)  The format for your next appointment:   In Person  Provider:   You may see Nelva Bush, MD or one of the following Advanced Practice Providers on your designated Care Team:   Murray Hodgkins, NP Christell Faith, PA-C Cadence Kathlen Mody, PA-C Gerrie Nordmann, NP   Important Information About Sugar

## 2022-06-02 ENCOUNTER — Other Ambulatory Visit: Payer: Self-pay | Admitting: Internal Medicine

## 2022-08-22 LAB — EXTERNAL GENERIC LAB PROCEDURE: COLOGUARD: NEGATIVE

## 2022-08-22 LAB — COLOGUARD: COLOGUARD: NEGATIVE

## 2022-09-24 ENCOUNTER — Other Ambulatory Visit: Payer: Self-pay

## 2022-09-24 DIAGNOSIS — N1832 Chronic kidney disease, stage 3b: Secondary | ICD-10-CM

## 2022-10-01 ENCOUNTER — Ambulatory Visit
Admission: RE | Admit: 2022-10-01 | Discharge: 2022-10-01 | Disposition: A | Discharge status: Home / Self Care | Payer: Medicare PPO | Source: Ambulatory Visit | Attending: Nephrology | Admitting: Nephrology

## 2022-10-01 DIAGNOSIS — N1832 Chronic kidney disease, stage 3b: Secondary | ICD-10-CM | POA: Insufficient documentation

## 2022-10-06 ENCOUNTER — Other Ambulatory Visit: Payer: Self-pay | Admitting: Internal Medicine

## 2022-12-02 ENCOUNTER — Other Ambulatory Visit: Payer: Self-pay | Admitting: Internal Medicine

## 2022-12-31 ENCOUNTER — Other Ambulatory Visit: Payer: Self-pay | Admitting: Internal Medicine

## 2023-02-12 ENCOUNTER — Encounter: Payer: Self-pay | Admitting: Internal Medicine

## 2023-02-12 ENCOUNTER — Ambulatory Visit: Payer: Medicare PPO | Admitting: Internal Medicine

## 2023-02-12 VITALS — BP 132/78 | HR 69 | Ht 72.0 in | Wt 206.4 lb

## 2023-02-12 DIAGNOSIS — Z79899 Other long term (current) drug therapy: Secondary | ICD-10-CM | POA: Diagnosis not present

## 2023-02-12 DIAGNOSIS — I5022 Chronic systolic (congestive) heart failure: Secondary | ICD-10-CM

## 2023-02-12 DIAGNOSIS — E782 Mixed hyperlipidemia: Secondary | ICD-10-CM

## 2023-02-12 DIAGNOSIS — I2510 Atherosclerotic heart disease of native coronary artery without angina pectoris: Secondary | ICD-10-CM | POA: Diagnosis not present

## 2023-02-12 DIAGNOSIS — I10 Essential (primary) hypertension: Secondary | ICD-10-CM | POA: Diagnosis not present

## 2023-02-12 MED ORDER — LOSARTAN POTASSIUM 50 MG PO TABS: 50.0000 mg | ORAL_TABLET | Freq: Every day | ORAL | 3 refills | Status: DC

## 2023-02-12 MED ORDER — FISH OIL 1000 MG PO CAPS: 2000.0000 mg | ORAL_CAPSULE | Freq: Two times a day (BID) | ORAL | Status: AC

## 2023-02-12 MED ORDER — FISH OIL 1000 MG PO CAPS: 2000.0000 mg | ORAL_CAPSULE | Freq: Every day | ORAL | Status: DC

## 2023-02-12 MED ORDER — ATORVASTATIN CALCIUM 40 MG PO TABS: 40.0000 mg | ORAL_TABLET | Freq: Every day | ORAL | 3 refills | Status: DC

## 2023-02-12 NOTE — Patient Instructions (Addendum)
Medication Instructions:  Your physician recommends the following medication changes.   START TAKING: Fish Oil 2000 mg by mouth twice a day (may purchase over the counter)  *If you need a refill on your cardiac medications before your next appointment, please call your pharmacy*   Lab Work: Your provider would like for you to return in 3 months to have the following labs drawn: (Lipid, ALT).   Please go to [facility] [address] ([facility] [address], [place] [address] You do not need an appointment.  They are open from 7:30 am-4 pm.  Lunch from 1:00 pm- 2:00 pm You will need to be fasting.   Testing/Procedures: No test ordered today    Follow-Up: At [facility], you and your health needs are our priority.  As part of our continuing mission to provide you with exceptional heart care, we have created designated Provider Care Teams.  These Care Teams include your primary Cardiologist (physician) and Advanced Practice Providers (APPs -  Physician Assistants and Nurse Practitioners) who all work together to provide you with the care you need, when you need it.  We recommend signing up for the patient portal called "MyChart".  Sign up information is provided on this After Visit Summary.  MyChart is used to connect with patients for Virtual Visits (Telemedicine).  Patients are able to view lab/test results, encounter notes, upcoming appointments, etc.  Non-urgent messages can be sent to your provider as well.   To learn more about what you can do with MyChart, go to [email or web address].    Your next appointment:   9 month(s)  Provider:   You may see Yvonne Kendall, MD or one of the following Advanced Practice Providers on your designated Care Team:   Nicolasa Ducking, NP Eula Listen, PA-C Cadence Fransico Michael, PA-C Charlsie Quest, NP

## 2023-02-12 NOTE — Progress Notes (Signed)
Cardiology Office Note:  .   Date:  02/14/2023  ID:  Jesus Bolton, DOB 1945-08-22, MRN [id number] PCP: Wilford Corner, PA-C  [facility] Providers Cardiologist:  Yvonne Kendall, MD     History of Present Illness: .   Jesus Bolton is a 77 y.o. 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  I last saw him in 03/2022, at which time he was feeling well without chest pain or shortness of breath.  He noted minimal dependent edema from time to time and transient orthostatic lightheadedness.  Triglycerides were noted to be elevated on last check; we discussed addition of Vascepa but deferred this in favor of lifestyle modifications.  Today, [name] reports that he continues to feel well without chest pain, shortness of breath, palpitations, or edema.  He has noticed that his heart rate will increase to the low 100s when he is walking briskly or exercising.  He reports a single episode of transient orthostatic lightheadedness a few weeks ago.  He has not passed out or fallen. ROS: See HPI  Studies Reviewed: Marland Kitchen   EKG Interpretation Date/Time:  Thursday February 12 2023 08:44:08 EDT Ventricular Rate:  69 PR Interval:  146 QRS Duration:  102 QT Interval:  392 QTC Calculation: 420 R Axis:   -24  Text Interpretation: Normal sinus rhythm Minimal voltage criteria for LVH, may be normal variant ( R in aVL ) When compared with ECG of 09-Apr-2022 No significant change was found Confirmed by Shriley Joffe, Cristal Deer [phone]) on 02/12/2023 8:55:38 AM    Risk Assessment/Calculations:           Physical Exam:   VS:  BP 132/78 (BP Location: Left Arm, Patient Position: Sitting, Cuff Size: Normal)    Pulse 69   Ht 6' (1.829 m)   Wt 206 lb 6.4 oz (93.6 kg)   SpO2 98%   BMI 27.99 kg/m    Wt Readings from Last 3 Encounters:  02/12/23 206 lb 6.4 oz (93.6 kg)  04/09/22 207 lb (93.9 kg)  10/10/21 215 lb (97.5 kg)    General:  NAD.  Accompanied by his wife. Neck: No JVD or HJR. Lungs: Clear to auscultation bilaterally without wheezes or crackles. Heart: Regular rate and rhythm without murmurs, rubs, or gallops. Abdomen: Soft, nontender, nondistended. Extremities: No lower extremity edema.  Varicose veins noted.  ASSESSMENT AND PLAN: .    Coronary artery disease: [name] continues to do well without recurrent angina.  We will plan to continue aspirin and atorvastatin for secondary prevention.  Chronic HFrEF: [name] appears euvolemic on exam and does not have any symptoms of heart failure consistent with NYHA class I.  We will continue current dose of losartan.  [name] has been intolerant of beta-blockers in the past.  Hypertension: Blood pressure borderline elevated today, typically better at home.  We will defer medication changes.  Hyperlipidemia: Most recent lipid panel in 09/2022 showed excellent LDL of 30.  Triglycerides remain mildly elevated at 228 despite lifestyle modifications.  Given that Tillman Sers has been cost prohibitive in the past, I have recommended that [name] try over-the-counter fish oil 2 g twice daily.  He will check with his insurance again to see if Tillman Sers is now more affordable.  Lipid  panel should be repeated at his next blood draw with [name].     Dispo: Turn to clinic in 9 months.  Signed, Yvonne Kendall, MD

## 2023-02-14 ENCOUNTER — Encounter: Payer: Self-pay | Admitting: Internal Medicine

## 2023-03-26 ENCOUNTER — Other Ambulatory Visit: Payer: Self-pay | Admitting: Nephrology

## 2023-03-26 DIAGNOSIS — N281 Cyst of kidney, acquired: Secondary | ICD-10-CM

## 2023-04-01 ENCOUNTER — Ambulatory Visit: Payer: Medicare PPO

## 2023-04-08 ENCOUNTER — Ambulatory Visit
Admission: RE | Admit: 2023-04-08 | Discharge: 2023-04-08 | Disposition: A | Discharge status: Home / Self Care | Payer: Medicare PPO | Source: Ambulatory Visit | Attending: Nephrology | Admitting: Nephrology

## 2023-04-08 DIAGNOSIS — N281 Cyst of kidney, acquired: Secondary | ICD-10-CM | POA: Diagnosis present

## 2023-05-04 ENCOUNTER — Other Ambulatory Visit: Payer: Self-pay

## 2023-05-04 ENCOUNTER — Encounter: Payer: Self-pay | Admitting: Internal Medicine

## 2023-05-04 MED ORDER — NITROGLYCERIN 0.4 MG SL SUBL: 0.4000 mg | SUBLINGUAL_TABLET | SUBLINGUAL | 3 refills | Status: AC | PRN

## 2023-06-03 LAB — LIPID PANEL
Chol/HDL Ratio: 3.1 {ratio} (ref 0.0–5.0)
Cholesterol, Total: 89 mg/dL — ABNORMAL LOW (ref 100–199)
HDL: 29 mg/dL — ABNORMAL LOW (ref >39)
LDL Chol Calc (NIH): 34 mg/dL (ref 0–99)
Triglycerides: 155 mg/dL — ABNORMAL HIGH (ref 0–149)
VLDL Cholesterol Cal: 26 mg/dL (ref 5–40)

## 2023-06-03 LAB — ALT: ALT: 21 [IU]/L (ref 0–44)

## 2023-08-10 ENCOUNTER — Other Ambulatory Visit: Payer: Self-pay | Admitting: Otolaryngology

## 2023-08-10 DIAGNOSIS — K119 Disease of salivary gland, unspecified: Secondary | ICD-10-CM

## 2023-08-11 ENCOUNTER — Ambulatory Visit
Admission: RE | Admit: 2023-08-11 | Discharge: 2023-08-11 | Discharge status: Home / Self Care | Payer: Medicare PPO | Source: Ambulatory Visit | Attending: Otolaryngology | Admitting: Otolaryngology

## 2023-08-11 DIAGNOSIS — K119 Disease of salivary gland, unspecified: Secondary | ICD-10-CM

## 2023-08-18 ENCOUNTER — Other Ambulatory Visit: Payer: Self-pay | Admitting: Otolaryngology

## 2023-08-18 DIAGNOSIS — K118 Other diseases of salivary glands: Secondary | ICD-10-CM

## 2023-08-26 NOTE — Progress Notes (Signed)
 Patient for Jesus Bolton guided Core RT Parotid Mass Biopsy on Thurs 08/27/23, I called and spoke with the patient on the phone and gave pre-procedure instructions. Pt was made aware to be here at 1p and check in at the [facility] registration desk. Pt stated understanding.  Called 08/26/23

## 2023-08-27 ENCOUNTER — Ambulatory Visit
Admission: RE | Admit: 2023-08-27 | Discharge: 2023-08-27 | Disposition: A | Discharge status: Home / Self Care | Source: Ambulatory Visit | Attending: Otolaryngology | Admitting: Otolaryngology

## 2023-08-27 DIAGNOSIS — K118 Other diseases of salivary glands: Secondary | ICD-10-CM | POA: Insufficient documentation

## 2023-08-27 MED ORDER — LIDOCAINE HCL (PF) 1 % IJ SOLN
5.0000 mL | Freq: Once | INTRAMUSCULAR | Status: AC
Start: 2023-08-27 — End: 2023-08-27
  Administered 2023-08-27: 5 mL via INTRADERMAL
  Filled 2023-08-27: qty 5

## 2023-08-27 NOTE — Procedures (Signed)
 Vascular and Interventional Radiology Procedure Note  Patient: KENDARRIUS TANZI DOB: Jun 29, 1945 Medical Record Number: [id number] Note Date/Time: 08/27/23 1:39 PM   Performing Physician: Roanna Banning, MD Assistant(s): None  Diagnosis: R parotid mass. No Dx   Procedure: RIGHT PAROTID MASS BIOPSY  Anesthesia: Local Anesthetic Complications: None Estimated Blood Loss: Minimal Specimens: Sent for Pathology  Findings:  Successful Ultrasound-guided biopsy of R parotid mass. A total of 3 samples were obtained. Hemostasis of the tract was achieved using Manual Pressure.  Plan: Bed rest for 0 hours.  See detailed procedure note with images in PACS. The patient tolerated the procedure well without incident or complication and was returned to Recovery in stable condition.    Roanna Banning, MD [facility] Interventional Radiology [facility] Radiology   Pager. [phone] Clinic. [phone]

## 2023-08-27 NOTE — Discharge Instructions (Signed)
 Reviewed with patient

## 2023-08-28 LAB — SURGICAL PATHOLOGY

## 2023-12-03 ENCOUNTER — Other Ambulatory Visit: Payer: Self-pay | Admitting: Nephrology

## 2023-12-03 DIAGNOSIS — N281 Cyst of kidney, acquired: Secondary | ICD-10-CM

## 2023-12-11 ENCOUNTER — Ambulatory Visit
Admission: RE | Admit: 2023-12-11 | Discharge: 2023-12-11 | Disposition: A | Discharge status: Home / Self Care | Source: Ambulatory Visit | Attending: Family Medicine | Admitting: Family Medicine

## 2023-12-11 DIAGNOSIS — N281 Cyst of kidney, acquired: Secondary | ICD-10-CM | POA: Diagnosis present

## 2023-12-30 ENCOUNTER — Encounter: Payer: Self-pay | Admitting: Internal Medicine

## 2023-12-30 ENCOUNTER — Ambulatory Visit: Attending: Internal Medicine | Admitting: Internal Medicine

## 2023-12-30 VITALS — BP 172/80 | HR 63 | Ht 72.0 in | Wt 202.6 lb

## 2023-12-30 DIAGNOSIS — I5022 Chronic systolic (congestive) heart failure: Secondary | ICD-10-CM

## 2023-12-30 DIAGNOSIS — I10 Essential (primary) hypertension: Secondary | ICD-10-CM | POA: Diagnosis not present

## 2023-12-30 DIAGNOSIS — I2510 Atherosclerotic heart disease of native coronary artery without angina pectoris: Secondary | ICD-10-CM

## 2023-12-30 DIAGNOSIS — E782 Mixed hyperlipidemia: Secondary | ICD-10-CM

## 2023-12-30 MED ORDER — LOSARTAN POTASSIUM 50 MG PO TABS: 50.0000 mg | ORAL_TABLET | Freq: Every day | ORAL | 3 refills | Status: DC

## 2023-12-30 MED ORDER — ATORVASTATIN CALCIUM 40 MG PO TABS: 40.0000 mg | ORAL_TABLET | Freq: Every day | ORAL | 3 refills | Status: AC

## 2023-12-30 NOTE — Progress Notes (Unsigned)
 Cardiology Office Note:  .   Date:  12/31/2023  ID:  Jesus Bolton, DOB March 07, 1946, MRN [id number] PCP: Cyrus Selinda Moose, PA-C  [facility] Providers Cardiologist:  Lonni Hanson, MD     History of Present Illness: .   Jesus Bolton is a 78 y.o. 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  I last saw him in 01/2019 for, at which time he was feeling well.  He noted that his heart rate will increase to the low 100s when walking briskly or exercising; I reassured him that this was a normal physiologic response.  Due to persistent hypertriglyceridemia and prohibitive cost of Vascepa, we agreed to a trial of OTC fish oil .  Most recent lipid panel through his PCPs office demonstrated improved triglycerides and continued excellent LDL.  Today, [name] reports that he has continued to feel well.  In fact, he is feeling the best he has in several years.  He denies chest pain, shortness of breath, palpitations, and lightheadedness.  He has mild dependent edema in both legs, which is chronic and stable.  He is working on losing a bit of weight.  He notes some lability in his blood pressure, though it is typically better in the evenings.  Most recent home reading was 116/70.   ROS: See HPI  Studies Reviewed: SABRA   EKG Interpretation Date/Time:  Wednesday December 30 2023 13:30:47 EDT Ventricular Rate:  63 PR Interval:  160 QRS Duration:  106 QT Interval:  416 QTC Calculation: 425 R Axis:   -27  Text Interpretation: Normal sinus rhythm Moderate voltage criteria for LVH, may be normal variant ( R in aVL , Cornell product ) Borderline ECG When compared with ECG of 12-Feb-2023 08:44, No  significant change was found Confirmed by Jesus Bolton ([id number]) on 12/30/2023 1:52:51 PM    Risk Assessment/Calculations:     HYPERTENSION CONTROL Vitals:   12/30/23 1325 12/30/23 1342  BP: (!) 158/85 (!) 172/80    The patient's blood pressure is elevated above target today.  In order to address the patient's elevated BP: Blood pressure will be monitored at home to determine if medication changes need to be made.; The blood pressure is usually elevated in clinic.  Blood pressures monitored at home have been optimal.          Physical Exam:   VS:  BP (!) 172/80 (Cuff Size: Normal)   Pulse 63   Ht 6' (1.829 m)   Wt 202 lb 9.6 oz (91.9 kg)   SpO2 98%   BMI 27.48 kg/m    Wt Readings from Last 3 Encounters:  12/30/23 202 lb 9.6 oz (91.9 kg)  02/12/23 206 lb 6.4 oz (93.6 kg)  04/09/22 207 lb (93.9 kg)    General:  NAD. Neck: No JVD or HJR. Lungs: Clear to auscultation bilaterally without wheezes or crackles. Heart: Regular rate and rhythm without murmurs, rubs, or gallops. Abdomen: Soft, nontender, nondistended. Extremities: No lower extremity edema.  ASSESSMENT AND PLAN: .    Coronary artery disease and mixed hyperlipidemia: Mr. Aboud continues to feel well without recurrent angina.  Will plan to continue aspirin  and atorvastatin  for secondary prevention.  I have also asked him to take fish oil  2000 mg daily rather than  twice a week to help improve his triglycerides as Vascepa has previously been cost prohibitive.  LDL well-controlled on the last check through Mr. Lanny office.  Chronic HFrEF: Mr. Tabet appears euvolemic other than chronic mild lower extremity edema that is likely due to to venous insufficiency.  Continue current dose of losartan  with a low threshold to increase this if blood pressure remains elevated.  Defer adding a beta-blocker given low normal resting heart rate at this time.  Hypertension: Blood pressure significantly elevated today though typically  better at home.  I have asked Mr. Hyson to keep monitoring his blood pressure and to alert us  if it is consistently above 140/90.  In that case, we would need to consider further escalation of losartan  and/or addition of a second antihypertensive agent.    Dispo: Return to clinic in 9 months.  Signed, Lonni Hanson, MD

## 2023-12-30 NOTE — Patient Instructions (Signed)
 Medication Instructions:  Continue your current medications.  Please check your blood pressure at least 3-4 times per week over the next two weeks.  If it is consistently at or above 140/90, please let us  know so that we can consider increasing your losartan  dose.  *If you need a refill on your cardiac medications before your next appointment, please call your pharmacy*  Lab Work: None.  Testing/Procedures: None.  Follow-Up: At [facility], you and your health needs are our priority.  As part of our continuing mission to provide you with exceptional heart care, our providers are all part of one team.  This team includes your primary Cardiologist (physician) and Advanced Practice Providers or APPs (Physician Assistants and Nurse Practitioners) who all work together to provide you with the care you need, when you need it.  Your next appointment:   9 month(s)  Provider:   You may see Lonni Hanson, MD or one of the following Advanced Practice Providers on your designated Care Team:   Lonni Meager, NP Lesley Maffucci, PA-C Bernardino Bring, PA-C Cadence Volin, PA-C Tylene Lunch, NP Barnie Hila, NP    We recommend signing up for the patient portal called MyChart.  Sign up information is provided on this After Visit Summary.  MyChart is used to connect with patients for Virtual Visits (Telemedicine).  Patients are able to view lab/test results, encounter notes, upcoming appointments, etc.  Non-urgent messages can be sent to your provider as well.   To learn more about what you can do with MyChart, go to [email or web address].

## 2023-12-31 ENCOUNTER — Encounter: Payer: Self-pay | Admitting: Internal Medicine

## 2024-01-06 ENCOUNTER — Other Ambulatory Visit: Payer: Self-pay | Admitting: *Deleted

## 2024-01-06 ENCOUNTER — Telehealth: Payer: Self-pay | Admitting: Internal Medicine

## 2024-01-06 DIAGNOSIS — Z79899 Other long term (current) drug therapy: Secondary | ICD-10-CM

## 2024-01-06 MED ORDER — LOSARTAN POTASSIUM 50 MG PO TABS: 75.0000 mg | ORAL_TABLET | Freq: Every day | ORAL | 3 refills | Status: DC

## 2024-01-06 NOTE — Telephone Encounter (Signed)
 Returned pt's call with Dr. Ulysses following recommendations:  I recommend that we increase losartan  to 75 mg daily and recheck a basic metabolic panel in 1 to 2 weeks to ensure stable renal function and potassium.  Jesus Bolton should try to limit his dietary potassium intake to minimize risk for hyperkalemia, given his chronic kidney disease and borderline elevated potassium levels in the past.   Jesus Hanson, MD [facility]      Foods to avoid that are high in potassium:  Fruits: Bananas, avocados, oranges, dried apricots, dates, raisins, prunes, and cantaloupe.   Vegetables: Potatoes, sweet potatoes, tomatoes, spinach, Swiss chard, beet greens, and other leafy greens.   Other: Nuts, seeds, legumes (beans, lentils), chocolate, salt substitutes, and processed foods.   Drinks: Some fruit juices, especially orange juice, and milk or milk substitutes.   Snacks: Pretzels, potato chips, and other salty snacks.   Follow up with a lab appointment (no appointment necessary).  Lab is open 8a-430p, but is closed for lunch 1:00-2:00 pm  Labs:  BMP, Potassium

## 2024-01-06 NOTE — Telephone Encounter (Signed)
 I recommend that we increase losartan  to 75 mg daily and recheck a basic metabolic panel in 1 to 2 weeks to ensure stable renal function and potassium.  Jesus Bolton should try to limit his dietary potassium intake to minimize risk for hyperkalemia, given his chronic kidney disease and borderline elevated potassium levels in the past.  Jesus Hanson, MD [facility]

## 2024-01-06 NOTE — Addendum Note (Signed)
 Addended by: HARL HERON DEL on: 01/06/2024 05:04 PM   Modules accepted: Orders

## 2024-01-06 NOTE — Telephone Encounter (Signed)
 Pt c/o BP issue: STAT if pt c/o blurred vision, one-sided weakness or slurred speech.  STAT if BP is GREATER than 180/120 TODAY.  STAT if BP is LESS than 90/60 and SYMPTOMATIC TODAY  1. What is your BP concern?   High BP reading  2. Have you taken any BP medication today?  Yes  3. What are your last 5 BP readings?  118/73 - today, around 2:00 pm 150/70 - today, morning 182/83 - yesterday, 7/22 128/78 - yesterday 174/71 - Monday, 7/21  4. Are you having any other symptoms (ex. Dizziness, headache, blurred vision, passed out)?   No  Patient stated he has been monitoring his BP for the last week and is concerned his BP is still trending high and his Losartan  is not working.  Patient thinks he made need to increase his medication.  Patient noted he is using a new BP machine.

## 2024-01-06 NOTE — Telephone Encounter (Signed)
 Returned pt's call and spoke with pt.  He states that he has a new blood pressure machine and his blood pressures are higher than what they were reading on his old machine; however, they are comparable to the reading that was taken at his recent cardiology visit. Prior to calling triage ([id number]) pt reports his BP to be 142/76 and 151/80   His readings from this week:  118/73 - today, around 2:00 pm 150/70 - today, morning  182/83 - yesterday, 7/22 128/78 - yesterday 174/71 - Monday, 7/21  Pt states that he takes his blood pressure in the morning, 2 hours after taking his medications and feels he may need to increase the dose on his Losartan .  I told him that I would send his concerns/request to Dr. Mady for further advise and will call him back with Dr. Ulysses recommendations.  Pt thanked me for the return call.

## 2024-01-26 ENCOUNTER — Other Ambulatory Visit: Payer: Self-pay

## 2024-01-26 DIAGNOSIS — Z79899 Other long term (current) drug therapy: Secondary | ICD-10-CM

## 2024-01-27 ENCOUNTER — Ambulatory Visit: Payer: Self-pay | Admitting: Internal Medicine

## 2024-01-27 ENCOUNTER — Encounter: Payer: Self-pay | Admitting: Internal Medicine

## 2024-01-27 LAB — BASIC METABOLIC PANEL WITH GFR
BUN/Creatinine Ratio: 13 (ref 10–24)
BUN: 24 mg/dL (ref 8–27)
CO2: 16 mmol/L — ABNORMAL LOW (ref 20–29)
Calcium: 9.8 mg/dL (ref 8.6–10.2)
Chloride: 103 mmol/L (ref 96–106)
Creatinine, Ser: 1.8 mg/dL — ABNORMAL HIGH (ref 0.76–1.27)
Glucose: 92 mg/dL (ref 70–99)
Potassium: 5 mmol/L (ref 3.5–5.2)
Sodium: 137 mmol/L (ref 134–144)
eGFR: 38 mL/min/1.73 — ABNORMAL LOW (ref >59)

## 2024-02-23 ENCOUNTER — Encounter: Payer: Self-pay | Admitting: Internal Medicine

## 2024-02-24 MED ORDER — LOSARTAN POTASSIUM 50 MG PO TABS: ORAL_TABLET | ORAL | 3 refills | Status: DC

## 2024-02-24 NOTE — Telephone Encounter (Signed)
 I recommend continuing the losartan  as [name] has been taking it (50 mg every morning and 25 mg every afternoon).  He should continue keeping an eye on his blood pressure and let us  know if it trends up.  Lonni Hanson, MD [facility]

## 2024-02-29 ENCOUNTER — Telehealth: Payer: Self-pay | Admitting: Internal Medicine

## 2024-02-29 MED ORDER — LOSARTAN POTASSIUM 50 MG PO TABS: 50.0000 mg | ORAL_TABLET | Freq: Every day | ORAL | 3 refills | Status: DC

## 2024-02-29 NOTE — Telephone Encounter (Signed)
 Returned pt's call regarding his refill on Cozaar  50mg  in the morning and 25 mg in the evening.  A refill had been sent on 9/10, but [employer] in [place] stated that they never received the order; Pharmacist suggested since it was a change in dosage from the July 7 prescription to send it as a new order instead of a refill.  Therefore, I canceled the 9/10 refill order and sent a new prescription for the above dose; 135 tablets with 3 refills.  I called and let the patient know that a new prescription had been sent and to call us  back if there were any further problems getting his medications.  Pt thanked me for the call and follow up.

## 2024-02-29 NOTE — Telephone Encounter (Signed)
 Pt c/o medication issue:  1. Name of Medication:   losartan  (COZAAR ) 50 MG tablet    2. How are you currently taking this medication (dosage and times per day)?    3. Are you having a reaction (difficulty breathing--STAT)? no  4. What is your medication issue? Patient is calling to speak to the nurse about the medication. Please advise

## 2024-03-11 ENCOUNTER — Encounter: Payer: Self-pay | Admitting: Internal Medicine

## 2024-03-11 DIAGNOSIS — I10 Essential (primary) hypertension: Secondary | ICD-10-CM

## 2024-03-16 NOTE — Telephone Encounter (Signed)
 Though blood pressures have improved, they still seem to be a bit high at times.  I recommend that we increase losartan  to 50 mg twice daily and check a basic metabolic panel in about 2 weeks.  Jesus Bolton should continue monitoring his blood pressure at home.  Lonni Hanson, MD [facility]

## 2024-03-16 NOTE — Addendum Note (Signed)
 Addended by: BRIEN SALM on: 03/16/2024 09:56 AM   Modules accepted: Orders

## 2024-03-29 ENCOUNTER — Ambulatory Visit

## 2024-03-29 ENCOUNTER — Telehealth: Payer: Self-pay | Admitting: Internal Medicine

## 2024-03-29 VITALS — BP 140/82 | HR 81 | Wt 197.0 lb

## 2024-03-29 DIAGNOSIS — R1114 Bilious vomiting: Secondary | ICD-10-CM

## 2024-03-29 DIAGNOSIS — I5022 Chronic systolic (congestive) heart failure: Secondary | ICD-10-CM

## 2024-03-29 DIAGNOSIS — I10 Essential (primary) hypertension: Secondary | ICD-10-CM | POA: Diagnosis not present

## 2024-03-29 DIAGNOSIS — Z79899 Other long term (current) drug therapy: Secondary | ICD-10-CM | POA: Diagnosis not present

## 2024-03-29 DIAGNOSIS — I2510 Atherosclerotic heart disease of native coronary artery without angina pectoris: Secondary | ICD-10-CM | POA: Diagnosis not present

## 2024-03-29 NOTE — Telephone Encounter (Signed)
 Pt c/o BP issue: STAT if pt c/o blurred vision, one-sided weakness or slurred speech  1. What are your last 5 BP readings? 185/82  2. Are you having any other symptoms (ex. Dizziness, headache, blurred vision, passed out)? Dizziness and nausea   3. What is your BP issue? Pt is concerned about bp going up. Please advise

## 2024-03-29 NOTE — Telephone Encounter (Signed)
 After consultation with Heron, RN for Dr. Argentina (DOD), called and scheduled the patient for an office visit with Dr. Argentina at 3:00 pm today.  Jesus Bolton expressed gratitude for the call.  Reiterated the signs and symptoms to be aware to call 911 or go to the ER.  Jesus Bolton verbalized understanding with all questions and concerns addressed at this time.

## 2024-03-29 NOTE — Progress Notes (Signed)
 Cardiology Office Note   Date:  03/29/2024  ID:  Jesus Bolton, DOB Jan 25, 1946, MRN [id number] PCP: Cyrus Selinda Moose, PA-C  [facility] Providers Cardiologist:  Lonni Hanson, MD   History of Present Illness Jesus Bolton is a 78 y.o. male PMH CAD status post PCI RCA 12/2017, mixed ischemic and nonischemic cardiomyopathy, CKD 3B, HTN, HLD who presents for an acute visit for further evaluation and management of nausea, vomiting, and elevated blood pressure.  Patient reports he felt somewhat dizzy this morning when he woke up.  Shortly thereafter, he vomited twice.  He then took a short nap and now feels completely normal and back to baseline.  He and his wife report that they checked his blood pressures right after vomiting and they were reading in the 180s systolic.  Notably, his home blood pressure cuff is consistently reading about 10-20 SBP systolic points above our readings in the office.  He denies any chest pain during the episode.  He says that his index episode of heart attack in 2019 his symptoms were chest pain with radiation to his left arm.  He denies any such symptoms today.  He reports that he now feels completely back to normal and was able to ambulate without any issues.  Relevant CVD History -TTE 05/2020 LVEF 35 to 40%, moderately dilated cavity, grade 1 diastolic dysfunction, mild AI - TTE 04/2018 LVEF 35 to 40% with mild AI and mild MR - Coronary angiography 12/2017 severe mid RCA disease treated with PCI.  Residual moderate disease of the mid LAD and mild disease of ostial RCA   ROS: Pt denies any chest discomfort, jaw pain, arm pain, palpitations, syncope, presyncope, orthopnea, PND, or LE edema.  Studies Reviewed I have independently reviewed the patient's ECG, previous cardiac testing, recent medical records, recent blood work.  Physical Exam VS:  BP (!) 140/82 (BP Location: Right Arm, Patient Position: Sitting, Cuff Size: Normal)   Pulse 81   Wt 197  lb (89.4 kg)   SpO2 98%   BMI 26.72 kg/m        Wt Readings from Last 3 Encounters:  03/29/24 197 lb (89.4 kg)  12/30/23 202 lb 9.6 oz (91.9 kg)  02/12/23 206 lb 6.4 oz (93.6 kg)    GEN: No acute distress. NECK: No JVD; No carotid bruits. CARDIAC: RRR, no murmurs, rubs, gallops. RESPIRATORY:  Clear to auscultation. EXTREMITIES:  Warm and well-perfused. No edema.  ASSESSMENT AND PLAN Nausea and vomiting HTN Patient presents for further evaluation of an isolated episode of nausea and vomiting.  This occurred shortly after waking today and has now completely resolved.  He has no chest discomfort and his ECG is at baseline.  He has no similar symptoms to his prior MI.  He has no shortness of breath or signs or symptoms of heart failure on exam.  Overall, suspect this is possibly due to dehydration versus food poisoning or other transient etiology that may have combined with his antihypertensive to cause symptoms.  His blood pressure was elevated at home, however, he checked this immediately after vomiting.  His numbers look good in office today.  Plan: - Basic labs with CBC and previously ordered BMP - Continue the losartan  at current dose for now; if he has significant azotemia/AKI, then we will need to hold and consider a different agent - I also recommended purchase of a new home BP cuff since his home readings are consistently reading 10-20 systolic points above what we are  getting in the office - Gave return precautions for any recurrent symptoms, chest discomfort, or any other concerning findings  CAD status post PCI RCA 2019 Mixed ischemic and nonischemic cardiomyopathy Seems to be doing well from the standpoint, no angina, no recurrence of similar symptoms to his index MI event.  His LDL is well-controlled and was 19 on last check 09/2023.  Can continue to watch for now.  Plan: - Continue ASA 81 mg daily and Lipitor  40 mg daily        Dispo: Follow-up with Dr. Mady in 6 months;  will reach back out to patient should his labs be significantly abnormal  Signed, Caron Poser, MD

## 2024-03-29 NOTE — Patient Instructions (Signed)
 Medication Instructions:  Your physician recommends that you continue on your current medications as directed. Please refer to the Current Medication list given to you today.  *If you need a refill on your cardiac medications before your next appointment, please call your pharmacy*  Lab Work: Your provider would like for you to have following labs drawn today BMET, CBC.   If you have labs (blood work) drawn today and your tests are completely normal, you will receive your results only by: MyChart Message (if you have MyChart) OR A paper copy in the mail If you have any lab test that is abnormal or we need to change your treatment, we will call you to review the results.  Testing/Procedures:  No test ordered today   Follow-Up: At [facility], you and your health needs are our priority.  As part of our continuing mission to provide you with exceptional heart care, our providers are all part of one team.  This team includes your primary Cardiologist (physician) and Advanced Practice Providers or APPs (Physician Assistants and Nurse Practitioners) who all work together to provide you with the care you need, when you need it.  Your next appointment:   6 month(s)  Provider:   You may see Lonni Hanson, MD or one of the following Advanced Practice Providers on your designated Care Team:   Lonni Meager, NP Lesley Maffucci, PA-C Bernardino Bring, PA-C Cadence Universal, PA-C Tylene Lunch, NP Barnie Hila, NP   We recommend signing up for the patient portal called MyChart.  Sign up information is provided on this After Visit Summary.  MyChart is used to connect with patients for Virtual Visits (Telemedicine).  Patients are able to view lab/test results, encounter notes, upcoming appointments, etc.  Non-urgent messages can be sent to your provider as well.   To learn more about what you can do with MyChart, go to [email or web address].   Other Instructions  Maintain your  hydration.

## 2024-03-29 NOTE — Telephone Encounter (Signed)
 Called and spoke with Jesus Bolton, patient's wife, on the speaker phone while both of them were in the bathroom.  Jesus Bolton reports that the patient has vomited 3 times since her initial call at 10:35 today.  Patient woke up feeling dizzy, and reports blood pressure of 185/82.  No other numbers reported at this time, though patient has been keeping track. Patient denies any flu or sickness or any unusual food.  Patient reports staying well hydrated with normal appetite and doing well otherwise until waking up this morning with elevated blood pressure.  Patient also reports taking Losartan  50 mg BID.  Patient prescribed Losartan  5o mg once daily. Patient denies any other symptoms like chest pain, or arm pain.  Informed the patient that this information will be shared with Dr. Mady to review and we will reach back out to them with any advice/recommendations.  In the meantime, if BP goes above 200/100, or if the patient develops any chest pain, lightheadedness, or worsening dizziness, nausea, or continues to vomit, to go to the emergency room or call 911 for a thorough evaluation.  Jesus Bolton verbalized understanding.

## 2024-03-30 ENCOUNTER — Other Ambulatory Visit: Payer: Self-pay | Admitting: *Deleted

## 2024-03-30 ENCOUNTER — Ambulatory Visit: Payer: Self-pay

## 2024-03-30 DIAGNOSIS — Z79899 Other long term (current) drug therapy: Secondary | ICD-10-CM

## 2024-03-30 LAB — CBC
Hematocrit: 40.9 % (ref 37.5–51.0)
Hemoglobin: 13.3 g/dL (ref 13.0–17.7)
MCH: 29.9 pg (ref 26.6–33.0)
MCHC: 32.5 g/dL (ref 31.5–35.7)
MCV: 92 fL (ref 79–97)
Platelets: 324 x10E3/uL (ref 150–450)
RBC: 4.45 x10E6/uL (ref 4.14–5.80)
RDW: 12.6 % (ref 11.6–15.4)
WBC: 8.4 x10E3/uL (ref 3.4–10.8)

## 2024-03-30 LAB — BASIC METABOLIC PANEL WITH GFR
BUN/Creatinine Ratio: 14 (ref 10–24)
BUN: 22 mg/dL (ref 8–27)
CO2: 19 mmol/L — ABNORMAL LOW (ref 20–29)
Calcium: 9.8 mg/dL (ref 8.6–10.2)
Chloride: 103 mmol/L (ref 96–106)
Creatinine, Ser: 1.55 mg/dL — ABNORMAL HIGH (ref 0.76–1.27)
Glucose: 98 mg/dL (ref 70–99)
Potassium: 5.6 mmol/L — ABNORMAL HIGH (ref 3.5–5.2)
Sodium: 136 mmol/L (ref 134–144)
eGFR: 46 mL/min/1.73 — ABNORMAL LOW (ref >59)

## 2024-04-01 ENCOUNTER — Encounter: Payer: Self-pay | Admitting: Internal Medicine

## 2024-04-02 LAB — BASIC METABOLIC PANEL WITH GFR
BUN/Creatinine Ratio: 14 (ref 10–24)
BUN: 25 mg/dL (ref 8–27)
CO2: 22 mmol/L (ref 20–29)
Calcium: 9.5 mg/dL (ref 8.6–10.2)
Chloride: 103 mmol/L (ref 96–106)
Creatinine, Ser: 1.79 mg/dL — ABNORMAL HIGH (ref 0.76–1.27)
Glucose: 81 mg/dL (ref 70–99)
Potassium: 5 mmol/L (ref 3.5–5.2)
Sodium: 137 mmol/L (ref 134–144)
eGFR: 38 mL/min/1.73 — ABNORMAL LOW (ref >59)

## 2024-04-02 LAB — POTASSIUM: Potassium: 5.1 mmol/L (ref 3.5–5.2)

## 2024-04-04 NOTE — Addendum Note (Signed)
 Addended by: DESIDERIO RUSSELL SAILOR on: 04/04/2024 01:53 PM   Modules accepted: Orders

## 2024-04-25 ENCOUNTER — Encounter: Payer: Self-pay | Admitting: Internal Medicine

## 2024-04-26 MED ORDER — AMLODIPINE BESYLATE 5 MG PO TABS: 5.0000 mg | ORAL_TABLET | Freq: Every day | ORAL | 3 refills | Status: AC

## 2024-04-26 NOTE — Telephone Encounter (Signed)
 I agree with adding amlodipine  5 mg daily, though we will need to keep an eye out for leg swelling, as this has been an issue in the past.  Lonni Hanson, MD [facility]
